# Patient Record
Sex: Female | Born: 1995 | Race: Black or African American | Hispanic: No | Marital: Single | Smoking: Never smoker
Health system: Southern US, Community
[De-identification: ages and names within clinical notes are randomized; demographics above are authoritative.]

## PROBLEM LIST (undated history)

## (undated) ENCOUNTER — Inpatient Hospital Stay (HOSPITAL_COMMUNITY): Payer: Self-pay

## (undated) ENCOUNTER — Ambulatory Visit (HOSPITAL_COMMUNITY): Admission: EM | Payer: Self-pay | Source: Home / Self Care

## (undated) DIAGNOSIS — D219 Benign neoplasm of connective and other soft tissue, unspecified: Secondary | ICD-10-CM

## (undated) DIAGNOSIS — F329 Major depressive disorder, single episode, unspecified: Secondary | ICD-10-CM

## (undated) DIAGNOSIS — D75A Glucose-6-phosphate dehydrogenase (G6PD) deficiency without anemia: Secondary | ICD-10-CM

## (undated) DIAGNOSIS — Z3400 Encounter for supervision of normal first pregnancy, unspecified trimester: Secondary | ICD-10-CM

## (undated) DIAGNOSIS — F32A Depression, unspecified: Secondary | ICD-10-CM

## (undated) HISTORY — DX: Depression, unspecified: F32.A

## (undated) HISTORY — PX: NO PAST SURGERIES: SHX2092

## (undated) HISTORY — DX: Major depressive disorder, single episode, unspecified: F32.9

## (undated) HISTORY — DX: Encounter for supervision of normal first pregnancy, unspecified trimester: Z34.00

---
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
## 2017-08-07 NOTE — L&D Delivery Note (Signed)
Patient received cytotec for induction of labor after SROM at 15 weeks. Patient delivered fetus and called out, RN present and clamped cord and took fetus away. Placenta remained in vagina. After additional cytotec, placenta remained partially in vagina and was able to be manually removed with gentle traction. Appeared intact and appropriately sized for gestational age after removal, no additional bleeding, no tissue noted in lower uterine segment. Pitocin continued. Cytogenetics sent.  Please see progress notes dated from same day for additional information.    Feliz Beam, M.D. [facility]

## 2017-09-16 ENCOUNTER — Other Ambulatory Visit: Payer: Self-pay

## 2017-09-16 ENCOUNTER — Encounter (HOSPITAL_COMMUNITY): Payer: Self-pay | Admitting: *Deleted

## 2017-09-16 ENCOUNTER — Emergency Department (HOSPITAL_COMMUNITY)
Admission: EM | Admit: 2017-09-16 | Discharge: 2017-09-16 | Disposition: A | Discharge status: Home / Self Care | Payer: Medicaid Other | Attending: Emergency Medicine | Admitting: Emergency Medicine

## 2017-09-16 DIAGNOSIS — H9202 Otalgia, left ear: Secondary | ICD-10-CM | POA: Insufficient documentation

## 2017-09-16 DIAGNOSIS — J111 Influenza due to unidentified influenza virus with other respiratory manifestations: Secondary | ICD-10-CM | POA: Diagnosis not present

## 2017-09-16 DIAGNOSIS — R1110 Vomiting, unspecified: Secondary | ICD-10-CM | POA: Diagnosis present

## 2017-09-16 DIAGNOSIS — M7918 Myalgia, other site: Secondary | ICD-10-CM | POA: Diagnosis not present

## 2017-09-16 DIAGNOSIS — R6889 Other general symptoms and signs: Secondary | ICD-10-CM

## 2017-09-16 DIAGNOSIS — B349 Viral infection, unspecified: Secondary | ICD-10-CM

## 2017-09-16 HISTORY — DX: Glucose-6-phosphate dehydrogenase (G6PD) deficiency without anemia: D75.A

## 2017-09-16 LAB — POC URINE PREG, ED: Preg Test, Ur: NEGATIVE

## 2017-09-16 MED ORDER — GUAIFENESIN ER 1200 MG PO TB12: 1.0000 | ORAL_TABLET | Freq: Two times a day (BID) | ORAL | 0 refills | Status: DC

## 2017-09-16 MED ORDER — ACETAMINOPHEN 500 MG PO TABS
1000.0000 mg | ORAL_TABLET | Freq: Once | ORAL | Status: AC
  Administered 2017-09-16: 1000 mg via ORAL
  Filled 2017-09-16: qty 2

## 2017-09-16 MED ORDER — IBUPROFEN 800 MG PO TABS: 800.0000 mg | ORAL_TABLET | Freq: Three times a day (TID) | ORAL | 0 refills | Status: DC | PRN

## 2017-09-16 MED ORDER — SODIUM CHLORIDE 0.9 % IV BOLUS (SEPSIS)
1000.0000 mL | Freq: Once | INTRAVENOUS | Status: AC
  Administered 2017-09-16: 1000 mL via INTRAVENOUS

## 2017-09-16 MED ORDER — IBUPROFEN 800 MG PO TABS
800.0000 mg | ORAL_TABLET | Freq: Once | ORAL | Status: AC
  Administered 2017-09-16: 800 mg via ORAL
  Filled 2017-09-16: qty 1

## 2017-09-16 NOTE — ED Provider Notes (Signed)
[facility] DEPT Provider Note   CSN: [id number] Arrival date & time: 09/16/17  0911     History   Chief Complaint Chief Complaint  Patient presents with  . Influenza    HPI Sandra Macdonald is a 22 y.o. female.  HPI Patient presents to the emergency department with body aches sore throat earaches and vomiting x1 the symptoms started yesterday..  The patient states that she did not take any medications prior to arrival.  Nothing seemed to make the condition better or worse.  She states that she felt like she may have a fever but did not check her temperature.  The patient denies chest pain, shortness of breath, headache,blurred vision, neck pain, fever, cough, weakness, numbness, dizziness, anorexia, edema, abdominal pain, nausea,  diarrhea, rash, back pain, dysuria, hematemesis, bloody stool, near syncope, or syncope. Past Medical History:  Diagnosis Date  . 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   History reviewed. No pertinent surgical history.  OB History    No data available       Home Medications    Prior to Admission medications   Medication Sig Start Date End Date Taking? Authorizing Provider  Pseudoeph-Doxylamine-DM-APAP (NYQUIL PO) Take 1-2 capsules by mouth at bedtime as needed (cold symptoms).   Yes [provider]  Guaifenesin 1200 MG TB12 Take 1 tablet (1,200 mg total) by mouth 2 (two) times daily. 09/16/17   Mareta Chesnut, Harrell Gave, PA-C  ibuprofen (ADVIL,MOTRIN) 800 MG tablet Take 1 tablet (800 mg total) by mouth every 8 (eight) hours as needed. 09/16/17   Dalia Heading, PA-C    Family History No family history on file.  Social History Social History   Tobacco Use  . Smoking status: Never Smoker  . Smokeless tobacco: Never Used  Substance Use Topics  . Alcohol use: No    Frequency: Never  . Drug use: No     Allergies   Blue dyes (parenteral); Codeine; and  Other   Review of Systems Review of Systems  All other systems negative except as documented in the HPI. All pertinent positives and negatives as reviewed in the HPI. Physical Exam Updated Vital Signs BP (!) 104/49   Pulse 96   Temp 98.7 F (37.1 C) (Oral)   Resp 16   Ht 5\' 5"  (1.651 m)   SpO2 100%   Physical Exam  Constitutional: She is oriented to person, place, and time. She appears well-developed and well-nourished. No distress.  HENT:  Head: Normocephalic and atraumatic.  Mouth/Throat: Oropharynx is clear and moist.  Eyes: Pupils are equal, round, and reactive to light.  Neck: Normal range of motion. Neck supple.  Cardiovascular: Normal rate, regular rhythm and normal heart sounds. Exam reveals no gallop and no friction rub.  No murmur heard. Pulmonary/Chest: Effort normal and breath sounds normal. No respiratory distress. She has no wheezes.  Abdominal: Soft. Bowel sounds are normal. She exhibits no distension. There is no tenderness.  Neurological: She is alert and oriented to person, place, and time. She exhibits normal muscle tone. Coordination normal.  Skin: Skin is warm and dry. Capillary refill takes less than 2 seconds. No rash noted. No erythema.  Psychiatric: She has a normal mood and affect. Her behavior is normal.  Nursing note and vitals reviewed.    ED Treatments / Results  Labs (all labs ordered are listed, but only abnormal results are displayed) Labs Reviewed  POC URINE PREG, ED  EKG  EKG Interpretation None       Radiology No results found.  Procedures Procedures (including critical care time)  Medications Ordered in ED Medications  sodium chloride 0.9 % bolus 1,000 mL (0 mLs Intravenous Stopped 09/16/17 1136)  sodium chloride 0.9 % bolus 1,000 mL (0 mLs Intravenous Stopped 09/16/17 1205)  ibuprofen (ADVIL,MOTRIN) tablet 800 mg (800 mg Oral Given 09/16/17 1136)  acetaminophen (TYLENOL) tablet 1,000 mg (1,000 mg Oral Given 09/16/17 1136)      Initial Impression / Assessment and Plan / ED Course  I have reviewed the triage vital signs and the nursing notes.  Pertinent labs & imaging results that were available during my care of the patient were reviewed by me and considered in my medical decision making (see chart for details).    The patient's symptoms have improved following IV fluids along with antipyretics.  The patient is advised to take Tylenol Motrin for fever given symptom control for what appears to be an influenza-like illness.  Told to increase her fluid intake and rest as much as possible.  The patient agrees the plan and all questions were answered.  She did get IV fluids which seemed to improve her as well.  Final Clinical Impressions(s) / ED Diagnoses   Final diagnoses:  Flu-like symptoms  Viral syndrome  Viral illness    ED Discharge Orders        Ordered    ibuprofen (ADVIL,MOTRIN) 800 MG tablet  Every 8 hours PRN     09/16/17 1214    Guaifenesin 1200 MG TB12  2 times daily     09/16/17 [address] PA-C 09/16/17 1611    Jola Schmidt, MD 09/16/17 [phone]

## 2017-09-16 NOTE — ED Triage Notes (Signed)
Pt vaguely describes flu symptoms, body aches sore left ear pain, vomited x 1 this morning, also requesting pregnancy test

## 2017-09-16 NOTE — Discharge Instructions (Signed)
Return here as needed.  Increase your fluid intake. 

## 2017-10-08 ENCOUNTER — Encounter (HOSPITAL_COMMUNITY): Payer: Self-pay | Admitting: Emergency Medicine

## 2017-10-08 ENCOUNTER — Other Ambulatory Visit: Payer: Self-pay

## 2017-10-08 ENCOUNTER — Emergency Department (HOSPITAL_COMMUNITY)
Admission: EM | Admit: 2017-10-08 | Discharge: 2017-10-08 | Disposition: A | Discharge status: Home / Self Care | Payer: Medicaid Other | Attending: Emergency Medicine | Admitting: Emergency Medicine

## 2017-10-08 DIAGNOSIS — B379 Candidiasis, unspecified: Secondary | ICD-10-CM | POA: Diagnosis not present

## 2017-10-08 DIAGNOSIS — R300 Dysuria: Secondary | ICD-10-CM | POA: Diagnosis present

## 2017-10-08 DIAGNOSIS — N390 Urinary tract infection, site not specified: Secondary | ICD-10-CM

## 2017-10-08 LAB — URINALYSIS, ROUTINE W REFLEX MICROSCOPIC
BILIRUBIN URINE: NEGATIVE
GLUCOSE, UA: NEGATIVE mg/dL
HGB URINE DIPSTICK: NEGATIVE
Nitrite: NEGATIVE
PROTEIN: NEGATIVE mg/dL
Specific Gravity, Urine: >1.03 — ABNORMAL HIGH (ref 1.005–1.030)
pH: 5.5 (ref 5.0–8.0)

## 2017-10-08 LAB — URINALYSIS, MICROSCOPIC (REFLEX)

## 2017-10-08 LAB — WET PREP, GENITAL
Clue Cells Wet Prep HPF POC: NONE SEEN
SPERM: NONE SEEN
Trich, Wet Prep: NONE SEEN

## 2017-10-08 LAB — PREGNANCY, URINE: PREG TEST UR: NEGATIVE

## 2017-10-08 LAB — GC/CHLAMYDIA PROBE AMP (~~LOC~~) NOT AT ARMC
Chlamydia: NEGATIVE
NEISSERIA GONORRHEA: NEGATIVE

## 2017-10-08 MED ORDER — AZITHROMYCIN 250 MG PO TABS
1000.0000 mg | ORAL_TABLET | Freq: Once | ORAL | Status: AC
  Administered 2017-10-08: 1000 mg via ORAL
  Filled 2017-10-08: qty 4

## 2017-10-08 MED ORDER — CEFTRIAXONE SODIUM 250 MG IJ SOLR
250.0000 mg | Freq: Once | INTRAMUSCULAR | Status: AC
  Administered 2017-10-08: 250 mg via INTRAMUSCULAR
  Filled 2017-10-08: qty 250

## 2017-10-08 MED ORDER — FLUCONAZOLE 200 MG PO TABS: 200.0000 mg | ORAL_TABLET | Freq: Every day | ORAL | 0 refills | Status: AC

## 2017-10-08 MED ORDER — CEPHALEXIN 500 MG PO CAPS: 500.0000 mg | ORAL_CAPSULE | Freq: Three times a day (TID) | ORAL | 0 refills | Status: AC

## 2017-10-08 MED ORDER — FLUCONAZOLE 200 MG PO TABS
200.0000 mg | ORAL_TABLET | Freq: Once | ORAL | Status: AC
  Administered 2017-10-08: 200 mg via ORAL
  Filled 2017-10-08: qty 1

## 2017-10-08 MED ORDER — LIDOCAINE HCL (PF) 1 % IJ SOLN
2.0000 mL | Freq: Once | INTRAMUSCULAR | Status: AC
  Administered 2017-10-08: 2 mL
  Filled 2017-10-08: qty 30

## 2017-10-08 NOTE — ED Notes (Signed)
Pelvic exam complete.

## 2017-10-08 NOTE — ED Provider Notes (Signed)
[facility] DEPT Provider Note   CSN: [id number] Arrival date & time: 10/08/17  2130     History   Chief Complaint Chief Complaint  Patient presents with  . Dysuria    HPI Sandra Macdonald is a 22 y.o. female.  HPI   22yo female with history of G6PD who presents with concern dysuria and white vaginal discharge.  Began one month ago with symptoms coming and going but are at their worst today. No fevers, no vomiting. Denies significant abdominal pain, reports mild suprapubic. Urinary frequency/hesitency.  No diarrhea or constipation.  Past Medical History:  Diagnosis Date  . 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   History reviewed. No pertinent surgical history.  OB History    No data available       Home Medications    Prior to Admission medications   Medication Sig Start Date End Date Taking? Authorizing Provider  ibuprofen (ADVIL,MOTRIN) 800 MG tablet Take 1 tablet (800 mg total) by mouth every 8 (eight) hours as needed. Patient taking differently: Take 800 mg by mouth every 8 (eight) hours as needed (For pain.).  09/16/17  Yes Lawyer, Harrell Gave, PA-C  Pseudoeph-Doxylamine-DM-APAP (NYQUIL PO) Take 1-2 capsules by mouth at bedtime as needed (cold symptoms).   Yes [provider]  cephALEXin (KEFLEX) 500 MG capsule Take 1 capsule (500 mg total) by mouth 3 (three) times daily for 5 days. 10/08/17 10/13/17  Gareth Morgan, MD  fluconazole (DIFLUCAN) 200 MG tablet Take 1 tablet (200 mg total) by mouth daily for 1 day. 10/11/17 10/12/17  Gareth Morgan, MD    Family History History reviewed. No pertinent family history.  Social History Social History   Tobacco Use  . Smoking status: Never Smoker  . Smokeless tobacco: Never Used  Substance Use Topics  . Alcohol use: No    Frequency: Never  . Drug use: No     Allergies   Blue dyes (parenteral); Codeine; and Other   Review of  Systems Review of Systems  Constitutional: Negative for fever.  HENT: Negative for sore throat.   Eyes: Negative for visual disturbance.  Respiratory: Negative for cough and shortness of breath.   Cardiovascular: Negative for chest pain.  Gastrointestinal: Positive for abdominal pain. Negative for constipation, diarrhea, nausea and vomiting.  Genitourinary: Positive for dysuria, urgency and vaginal discharge. Negative for difficulty urinating.  Musculoskeletal: Negative for back pain.  Skin: Negative for rash.  Neurological: Negative for syncope and headaches.     Physical Exam Updated Vital Signs BP 104/75   Pulse (!) 111   Temp 98.3 F (36.8 C) (Oral)   Resp 18   Ht 5\' 5"  (1.651 m)   LMP 09/24/2017 (Approximate)   SpO2 97%   Physical Exam  Constitutional: She is oriented to person, place, and time. She appears well-developed and well-nourished. No distress.  HENT:  Head: Normocephalic and atraumatic.  Eyes: Conjunctivae and EOM are normal.  Neck: Normal range of motion.  Cardiovascular: Normal rate, regular rhythm and intact distal pulses.  Pulmonary/Chest: Effort normal. No respiratory distress.  Abdominal: Soft. She exhibits no distension. There is no tenderness. There is no guarding.  Genitourinary: Uterus is not tender. Cervix exhibits discharge.  Musculoskeletal: She exhibits no edema or tenderness.  Neurological: She is alert and oriented to person, place, and time.  Skin: Skin is warm and dry. No rash noted. She is not diaphoretic. No erythema.  Nursing note  and vitals reviewed.    ED Treatments / Results  Labs (all labs ordered are listed, but only abnormal results are displayed) Labs Reviewed  WET PREP, GENITAL - Abnormal; Notable for the following components:      Result Value   Yeast Wet Prep HPF POC PRESENT (*)    WBC, Wet Prep HPF POC MANY (*)    All other components within normal limits  URINALYSIS, ROUTINE W REFLEX MICROSCOPIC - Abnormal; Notable  for the following components:   APPearance CLOUDY (*)    Specific Gravity, Urine >1.030 (*)    Ketones, ur TRACE (*)    Leukocytes, UA MODERATE (*)    All other components within normal limits  URINALYSIS, MICROSCOPIC (REFLEX) - Abnormal; Notable for the following components:   Bacteria, UA FEW (*)    Squamous Epithelial / LPF 6-30 (*)    All other components within normal limits  URINE CULTURE  PREGNANCY, URINE  GC/CHLAMYDIA PROBE AMP ([place]) NOT AT [facility]    EKG  EKG Interpretation None       Radiology No results found.  Procedures Procedures (including critical care time)  Medications Ordered in ED Medications  fluconazole (DIFLUCAN) tablet 200 mg (not administered)  cefTRIAXone (ROCEPHIN) injection 250 mg (250 mg Intramuscular Given 10/08/17 0821)  azithromycin (ZITHROMAX) tablet 1,000 mg (1,000 mg Oral Given 10/08/17 0821)  lidocaine (PF) (XYLOCAINE) 1 % injection 2 mL (2 mLs Other Given 10/08/17 5852)     Initial Impression / Assessment and Plan / ED Course  I have reviewed the triage vital signs and the nursing notes.  Pertinent labs & imaging results that were available during my care of the patient were reviewed by me and considered in my medical decision making (see chart for details).      22yo female with history of G6PD who presents with concern dysuria and white vaginal discharge.  Pregnancy test negative. Urinalysis shows concern for UTI in the setting or urinary symptoms.  Wet prep shows yeast .  Offered treatment empirically for GC/Chl which patient accepts.  Given keflex for UTI, fluconazole in ED, and dose to take when she completes her abx. Counseled in safe sex practices. Patient discharged in stable condition with understanding of reasons to return.     Final Clinical Impressions(s) / ED Diagnoses   Final diagnoses:  Urinary tract infection without hematuria, site unspecified  Yeast infection    ED Discharge Orders        Ordered     fluconazole (DIFLUCAN) 200 MG tablet  Daily     10/08/17 0911    cephALEXin (KEFLEX) 500 MG capsule  3 times daily     10/08/17 7782       Gareth Morgan, MD 10/08/17 [phone]

## 2017-10-08 NOTE — ED Triage Notes (Signed)
Pt presents to ED with c/o pain and burning on urination, and white vaginal discharges--- onset a month ago when started having sex.  Pt states symptoms come and go but it just got its worst today.

## 2017-10-08 NOTE — ED Notes (Signed)
This RN did not send urine culture down with UA. Will make patient aware of need for another urine sample.

## 2017-10-09 LAB — URINE CULTURE

## 2017-10-10 ENCOUNTER — Telehealth: Payer: Self-pay | Admitting: Emergency Medicine

## 2017-10-10 NOTE — Telephone Encounter (Signed)
Post ED Visit - Positive Culture Follow-up  Culture report reviewed by antimicrobial stewardship pharmacist:  []  Elenor Quinones, Pharm.D. []  Heide Guile, Pharm.D., BCPS AQ-ID []  Parks Neptune, Pharm.D., BCPS []  Alycia Rossetti, Pharm.D., BCPS []  [place], Pharm.D., BCPS, AAHIVP []  Legrand Como, Pharm.D., BCPS, AAHIVP []  Salome Arnt, PharmD, BCPS []  Jalene Mullet, PharmD []  Vincenza Hews, PharmD, BCPS [facility] PharmD  Positive urine culture Treated with cephalexin and fluconazole, organism sensitive to the same and no further patient follow-up is required at this time.  Hazle Nordmann 10/10/2017, 12:15 PM

## 2017-10-10 NOTE — Telephone Encounter (Signed)
Post ED Visit - Positive Culture Follow-up  Culture report reviewed by antimicrobial stewardship pharmacist:  []  Elenor Quinones, Pharm.D. []  Heide Guile, Pharm.D., BCPS AQ-ID []  Parks Neptune, Pharm.D., BCPS []  Alycia Rossetti, Pharm.D., BCPS []  [place], Pharm.D., BCPS, AAHIVP []  Legrand Como, Pharm.D., BCPS, AAHIVP []  Salome Arnt, PharmD, BCPS []  Jalene Mullet, PharmD []  Vincenza Hews, PharmD, BCPS [facility] PharmD  Positive urine culture Treated with cephalexin and fluconazole, organism sensitive to the same and no further patient follow-up is required at this time.  Hazle Nordmann 10/10/2017, 11:58 AM

## 2017-12-26 ENCOUNTER — Other Ambulatory Visit: Payer: Self-pay

## 2017-12-26 ENCOUNTER — Ambulatory Visit (INDEPENDENT_AMBULATORY_CARE_PROVIDER_SITE_OTHER): Payer: Medicaid Other

## 2017-12-26 ENCOUNTER — Encounter: Payer: Self-pay | Admitting: Family Medicine

## 2017-12-26 DIAGNOSIS — N912 Amenorrhea, unspecified: Secondary | ICD-10-CM | POA: Diagnosis present

## 2017-12-26 DIAGNOSIS — Z3201 Encounter for pregnancy test, result positive: Secondary | ICD-10-CM

## 2017-12-26 LAB — POCT URINE PREGNANCY: Preg Test, Ur: POSITIVE — AB

## 2017-12-26 NOTE — Patient Instructions (Signed)
Start taking prenatal vitamins with DHA.  Follow up with PCP for other issues.

## 2017-12-26 NOTE — Progress Notes (Signed)
Patient here today for pregnancy test.  LMP 11/30/2017.  Patient has had some spotting the last couple days, light pink does not soak a panty liner. No other issues. ECC 09/09/2018

## 2017-12-27 ENCOUNTER — Encounter (HOSPITAL_COMMUNITY): Payer: Self-pay | Admitting: *Deleted

## 2017-12-27 ENCOUNTER — Inpatient Hospital Stay (HOSPITAL_COMMUNITY)
Admission: AD | Admit: 2017-12-27 | Discharge: 2017-12-27 | Disposition: A | Discharge status: Home / Self Care | Payer: Medicaid Other | Source: Ambulatory Visit | Attending: Obstetrics and Gynecology | Admitting: Obstetrics and Gynecology

## 2017-12-27 DIAGNOSIS — Z885 Allergy status to narcotic agent status: Secondary | ICD-10-CM | POA: Insufficient documentation

## 2017-12-27 DIAGNOSIS — Z3A01 Less than 8 weeks gestation of pregnancy: Secondary | ICD-10-CM | POA: Diagnosis not present

## 2017-12-27 DIAGNOSIS — O26891 Other specified pregnancy related conditions, first trimester: Secondary | ICD-10-CM

## 2017-12-27 DIAGNOSIS — N898 Other specified noninflammatory disorders of vagina: Secondary | ICD-10-CM | POA: Diagnosis not present

## 2017-12-27 DIAGNOSIS — R300 Dysuria: Secondary | ICD-10-CM | POA: Diagnosis not present

## 2017-12-27 LAB — URINALYSIS, ROUTINE W REFLEX MICROSCOPIC
BILIRUBIN URINE: NEGATIVE
Glucose, UA: NEGATIVE mg/dL
HGB URINE DIPSTICK: NEGATIVE
KETONES UR: NEGATIVE mg/dL
NITRITE: NEGATIVE
Protein, ur: NEGATIVE mg/dL
SPECIFIC GRAVITY, URINE: 1.02 (ref 1.005–1.030)
pH: 5 (ref 5.0–8.0)

## 2017-12-27 LAB — WET PREP, GENITAL
Clue Cells Wet Prep HPF POC: NONE SEEN
SPERM: NONE SEEN
Trich, Wet Prep: NONE SEEN
Yeast Wet Prep HPF POC: NONE SEEN

## 2017-12-27 NOTE — Discharge Instructions (Signed)

## 2017-12-27 NOTE — MAU Provider Note (Addendum)
History     CSN: [id number]  Arrival date and time: 12/27/17 1456  Chief Complaint  Patient presents with  . Vaginal Discharge  . Vaginal Itching   G1 @[redacted]w[redacted]d  here with vaginal discharge and dysuria. Sx started 2 weeks ago. Vaginal discharge is yellow, thick, and malodorous. Having some irritation but not itching. No new partner. No hx STDs. Denies hematuria, urgency, frequency. No VB or pain.  OB History    Gravida  1   Para      Term      Preterm      AB      Living        SAB      TAB      Ectopic      Multiple      Live Births              Past Medical History:  Diagnosis Date  . 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  No family history on file.  Social History   Tobacco Use  . Smoking status: Never Smoker  . Smokeless tobacco: Never Used  Substance Use Topics  . Alcohol use: No    Frequency: Never  . Drug use: No    Allergies:  Allergies  Allergen Reactions  . Blue Dyes (Parenteral) Other (See Comments)    G6PD-- Decreased hemoglobin  . Codeine Other (See Comments)    Decreased hemoglobin  . Other Other (See Comments)    G6PD--Decreased hemoglobin to potato chips (not potatoes in other forms)    Medications Prior to Admission  Medication Sig Dispense Refill Last Dose  . ibuprofen (ADVIL,MOTRIN) 800 MG tablet Take 1 tablet (800 mg total) by mouth every 8 (eight) hours as needed. (Patient taking differently: Take 800 mg by mouth every 8 (eight) hours as needed (For pain.). ) 21 tablet 0 3 weeks ago  . Pseudoeph-Doxylamine-DM-APAP (NYQUIL PO) Take 1-2 capsules by mouth at bedtime as needed (cold symptoms).   3 weeks ago    Review of Systems  Gastrointestinal: Negative for abdominal pain.  Genitourinary: Positive for dysuria and vaginal discharge. Negative for frequency, hematuria, urgency and vaginal bleeding.   Physical Exam   Blood pressure 125/60, pulse 99, temperature 98.7 F (37.1 C), resp. rate 18,  height 5' 5.5" (1.664 m), weight 168 lb (76.2 kg), last menstrual period 11/30/2017.  Physical Exam  Constitutional: She is oriented to person, place, and time. She appears well-developed and well-nourished. No distress.  HENT:  Head: Normocephalic.  Respiratory: Effort normal.  Musculoskeletal: Normal range of motion.  Neurological: She is alert and oriented to person, place, and time.  Skin: Skin is warm and dry.  Psychiatric: She has a normal mood and affect.   Results for orders placed or performed during the hospital encounter of 12/27/17 (from the past 24 hour(s))  Urinalysis, Routine w reflex microscopic     Status: Abnormal   Collection Time: 12/27/17  3:30 PM  Result Value Ref Range   Color, Urine YELLOW YELLOW   APPearance HAZY (A) CLEAR   Specific Gravity, Urine 1.020 1.005 - 1.030   pH 5.0 5.0 - 8.0   Glucose, UA NEGATIVE NEGATIVE mg/dL   Hgb urine dipstick NEGATIVE NEGATIVE   Bilirubin Urine NEGATIVE NEGATIVE   Ketones, ur NEGATIVE NEGATIVE mg/dL   Protein, ur NEGATIVE NEGATIVE mg/dL   Nitrite NEGATIVE NEGATIVE   Leukocytes, UA LARGE (A) NEGATIVE   RBC / HPF 0-5 0 -  5 RBC/hpf   WBC, UA 6-10 0 - 5 WBC/hpf   Bacteria, UA RARE (A) NONE SEEN   Squamous Epithelial / LPF 11-20 0 - 5   Mucus PRESENT   Wet prep, genital     Status: Abnormal   Collection Time: 12/27/17  3:30 PM  Result Value Ref Range   Yeast Wet Prep HPF POC NONE SEEN NONE SEEN   Trich, Wet Prep NONE SEEN NONE SEEN   Clue Cells Wet Prep HPF POC NONE SEEN NONE SEEN   WBC, Wet Prep HPF POC FEW (A) NONE SEEN   Sperm NONE SEEN    MAU Course  Procedures  MDM Labs ordered and reviewed. No evidence of yeast, BV, or UTI. Will send UC. GC pending. Stable for discharge.  Assessment and Plan   1. Dysuria   2. Vaginal discharge during pregnancy in first trimester   3. [redacted] weeks gestation of pregnancy    Discharge home Follow up with OB provider for care  Allergies as of 12/27/2017      Reactions   Blue  Dyes (parenteral) Other (See Comments)   G6PD-- Decreased hemoglobin   Codeine Other (See Comments)   Decreased hemoglobin   Other Other (See Comments)   G6PD--Decreased hemoglobin to potato chips (not potatoes in other forms)      Medication List    STOP taking these medications   ibuprofen 800 MG tablet Commonly known as:  [place]      Julianne Handler, CNM 12/27/2017, 4:43 PM

## 2017-12-27 NOTE — MAU Note (Signed)
Pt reports a yellow vaginal discharge and vaginal irritation for several weeks

## 2017-12-28 LAB — GC/CHLAMYDIA PROBE AMP (~~LOC~~) NOT AT ARMC
Chlamydia: NEGATIVE
NEISSERIA GONORRHEA: NEGATIVE

## 2017-12-29 LAB — CULTURE, OB URINE

## 2017-12-30 ENCOUNTER — Encounter (HOSPITAL_COMMUNITY): Payer: Self-pay | Admitting: *Deleted

## 2017-12-30 ENCOUNTER — Other Ambulatory Visit: Payer: Self-pay

## 2017-12-30 ENCOUNTER — Inpatient Hospital Stay (HOSPITAL_COMMUNITY): Payer: Medicaid Other

## 2017-12-30 ENCOUNTER — Inpatient Hospital Stay (HOSPITAL_COMMUNITY)
Admission: AD | Admit: 2017-12-30 | Discharge: 2017-12-30 | Disposition: A | Discharge status: Home / Self Care | Payer: Medicaid Other | Source: Ambulatory Visit | Attending: Obstetrics & Gynecology | Admitting: Obstetrics & Gynecology

## 2017-12-30 DIAGNOSIS — R109 Unspecified abdominal pain: Secondary | ICD-10-CM | POA: Insufficient documentation

## 2017-12-30 DIAGNOSIS — O26891 Other specified pregnancy related conditions, first trimester: Secondary | ICD-10-CM | POA: Diagnosis not present

## 2017-12-30 DIAGNOSIS — O3411 Maternal care for benign tumor of corpus uteri, first trimester: Secondary | ICD-10-CM | POA: Insufficient documentation

## 2017-12-30 DIAGNOSIS — O3680X0 Pregnancy with inconclusive fetal viability, not applicable or unspecified: Secondary | ICD-10-CM

## 2017-12-30 DIAGNOSIS — Z3A01 Less than 8 weeks gestation of pregnancy: Secondary | ICD-10-CM | POA: Diagnosis not present

## 2017-12-30 DIAGNOSIS — O26899 Other specified pregnancy related conditions, unspecified trimester: Secondary | ICD-10-CM

## 2017-12-30 DIAGNOSIS — D219 Benign neoplasm of connective and other soft tissue, unspecified: Secondary | ICD-10-CM

## 2017-12-30 DIAGNOSIS — D259 Leiomyoma of uterus, unspecified: Secondary | ICD-10-CM | POA: Diagnosis not present

## 2017-12-30 LAB — URINALYSIS, ROUTINE W REFLEX MICROSCOPIC
Bilirubin Urine: NEGATIVE
Glucose, UA: NEGATIVE mg/dL
Hgb urine dipstick: NEGATIVE
Ketones, ur: NEGATIVE mg/dL
Nitrite: NEGATIVE
Protein, ur: NEGATIVE mg/dL
Specific Gravity, Urine: 1.004 — ABNORMAL LOW (ref 1.005–1.030)
pH: 6 (ref 5.0–8.0)

## 2017-12-30 LAB — COMPREHENSIVE METABOLIC PANEL WITH GFR
ALT: 12 U/L — ABNORMAL LOW (ref 14–54)
AST: 16 U/L (ref 15–41)
Albumin: 3.8 g/dL (ref 3.5–5.0)
Alkaline Phosphatase: 43 U/L (ref 38–126)
Anion gap: 9 (ref 5–15)
BUN: 10 mg/dL (ref 6–20)
CO2: 23 mmol/L (ref 22–32)
Calcium: 9.2 mg/dL (ref 8.9–10.3)
Chloride: 105 mmol/L (ref 101–111)
Creatinine, Ser: 0.66 mg/dL (ref 0.44–1.00)
GFR calc Af Amer: >60 mL/min
GFR calc non Af Amer: >60 mL/min
Glucose, Bld: 95 mg/dL (ref 65–99)
Potassium: 3.4 mmol/L — ABNORMAL LOW (ref 3.5–5.1)
Sodium: 137 mmol/L (ref 135–145)
Total Bilirubin: 0.3 mg/dL (ref 0.3–1.2)
Total Protein: 7.7 g/dL (ref 6.5–8.1)

## 2017-12-30 LAB — CBC
HCT: 29.6 % — ABNORMAL LOW (ref 36.0–46.0)
Hemoglobin: 8.7 g/dL — ABNORMAL LOW (ref 12.0–15.0)
MCH: 21.9 pg — ABNORMAL LOW (ref 26.0–34.0)
MCHC: 29.4 g/dL — ABNORMAL LOW (ref 30.0–36.0)
MCV: 74.6 fL — ABNORMAL LOW (ref 78.0–100.0)
Platelets: 408 K/uL — ABNORMAL HIGH (ref 150–400)
RBC: 3.97 MIL/uL (ref 3.87–5.11)
RDW: 18.9 % — ABNORMAL HIGH (ref 11.5–15.5)
WBC: 7.4 K/uL (ref 4.0–10.5)

## 2017-12-30 LAB — HCG, QUANTITATIVE, PREGNANCY: hCG, Beta Chain, Quant, S: 5880 m[IU]/mL — ABNORMAL HIGH

## 2017-12-30 NOTE — Discharge Instructions (Signed)
Ectopic Pregnancy An ectopic pregnancy happens when a fertilized egg grows outside the uterus. A pregnancy cannot live outside of the uterus. This problem often happens in the fallopian tube. It is often caused by damage to the fallopian tube. If this problem is found early, you may be treated with medicine. If your tube tears or bursts open (ruptures), you will bleed inside. This is an emergency. You will need surgery. Get help right away. What are the signs or symptoms? You may have normal pregnancy symptoms at first. These include:  Missing your period.  Feeling sick to your stomach (nauseous).  Being tired.  Having tender breasts.  Then, you may start to have symptoms that are not normal. These include:  Pain with sex (intercourse).  Bleeding from the vagina. This includes light bleeding (spotting).  Belly (abdomen) or lower belly cramping or pain. This may be felt on one side.  A fast heartbeat (pulse).  Passing out (fainting) after going poop (bowel movement).  If your tube tears, you may have symptoms such as:  Really bad pain in the belly or lower belly. This happens suddenly.  Dizziness.  Passing out.  Shoulder pain.  Get help right away if: You have any of these symptoms. This is an emergency. This information is not intended to replace advice given to you by your health care provider. Make sure you discuss any questions you have with your health care provider. Document Released: 10/20/2008 Document Revised: 12/30/2015 Document Reviewed: 03/05/2013 Elsevier Interactive Patient Education  2017 [place] [facility] at 8:30 am on Wednesday, May 29 for lab work. Plan to be at the clinic for at least 2 hours. \   First Trimester of Pregnancy The first trimester of pregnancy is from week 1 until the end of week 13 (months 1 through 3). During this time, your baby will begin to develop inside you. At 6-8 weeks, the eyes and face  are formed, and the heartbeat can be seen on ultrasound. At the end of 12 weeks, all the baby's organs are formed. Prenatal care is all the medical care you receive before the birth of your baby. Make sure you get good prenatal care and follow all of your doctor's instructions. Follow these instructions at home: Medicines  Take over-the-counter and prescription medicines only as told by your doctor. Some medicines are safe and some medicines are not safe during pregnancy.  Take a prenatal vitamin that contains at least 600 micrograms (mcg) of folic acid.  If you have trouble pooping (constipation), take medicine that will make your stool soft (stool softener) if your doctor approves. Eating and drinking  Eat regular, healthy meals.  Your doctor will tell you the amount of weight gain that is right for you.  Avoid raw meat and uncooked cheese.  If you feel sick to your stomach (nauseous) or throw up (vomit): ? Eat 4 or 5 small meals a day instead of 3 large meals. ? Try eating a few soda crackers. ? Drink liquids between meals instead of during meals.  To prevent constipation: ? Eat foods that are high in fiber, like fresh fruits and vegetables, whole grains, and beans. ? Drink enough fluids to keep your pee (urine) clear or pale yellow. Activity  Exercise only as told by your doctor. Stop exercising if you have cramps or pain in your lower belly (abdomen) or low back.  Do not exercise if it is too hot, too humid, or if you  are in a place of great height (high altitude).  Try to avoid standing for long periods of time. Move your legs often if you must stand in one place for a long time.  Avoid heavy lifting.  Wear low-heeled shoes. Sit and stand up straight.  You can have sex unless your doctor tells you not to. Relieving pain and discomfort  Wear a good support bra if your breasts are sore.  Take warm water baths (sitz baths) to soothe pain or discomfort caused by  hemorrhoids. Use hemorrhoid cream if your doctor says it is okay.  Rest with your legs raised if you have leg cramps or low back pain.  If you have puffy, bulging veins (varicose veins) in your legs: ? Wear support hose or compression stockings as told by your doctor. ? Raise (elevate) your feet for 15 minutes, 3-4 times a day. ? Limit salt in your food. Prenatal care  Schedule your prenatal visits by the twelfth week of pregnancy.  Write down your questions. Take them to your prenatal visits.  Keep all your prenatal visits as told by your doctor. This is important. Safety  Wear your seat belt at all times when driving.  Make a list of emergency phone numbers. The list should include numbers for family, friends, the hospital, and police and fire departments. General instructions  Ask your doctor for a referral to a local prenatal class. Begin classes no later than at the start of month 6 of your pregnancy.  Ask for help if you need counseling or if you need help with nutrition. Your doctor can give you advice or tell you where to go for help.  Do not use hot tubs, steam rooms, or saunas.  Do not douche or use tampons or scented sanitary pads.  Do not cross your legs for long periods of time.  Avoid all herbs and alcohol. Avoid drugs that are not approved by your doctor.  Do not use any tobacco products, including cigarettes, chewing tobacco, and electronic cigarettes. If you need help quitting, ask your doctor. You may get counseling or other support to help you quit.  Avoid cat litter boxes and soil used by cats. These carry germs that can cause birth defects in the baby and can cause a loss of your baby (miscarriage) or stillbirth.  Visit your dentist. At home, brush your teeth with a soft toothbrush. Be gentle when you floss. Contact a doctor if:  You are dizzy.  You have mild cramps or pressure in your lower belly.  You have a nagging pain in your belly area.  You  continue to feel sick to your stomach, you throw up, or you have watery poop (diarrhea).  You have a bad smelling fluid coming from your vagina.  You have pain when you pee (urinate).  You have increased puffiness (swelling) in your face, hands, legs, or ankles. Get help right away if:  You have a fever.  You are leaking fluid from your vagina.  You have spotting or bleeding from your vagina.  You have very bad belly cramping or pain.  You gain or lose weight rapidly.  You throw up blood. It may look like coffee grounds.  You are around people who have [place] measles, fifth disease, or chickenpox.  You have a very bad headache.  You have shortness of breath.  You have any kind of trauma, such as from a fall or a car accident. Summary  The first trimester of pregnancy is  from week 1 until the end of week 13 (months 1 through 3).  To take care of yourself and your unborn baby, you will need to eat healthy meals, take medicines only if your doctor tells you to do so, and do activities that are safe for you and your baby.  Keep all follow-up visits as told by your doctor. This is important as your doctor will have to ensure that your baby is healthy and growing well. This information is not intended to replace advice given to you by your health care provider. Make sure you discuss any questions you have with your health care provider. Document Released: 01/10/2008 Document Revised: 08/01/2016 Document Reviewed: 08/01/2016 Elsevier Interactive Patient Education  2017 Elsevier Inc[employer] Uterine Fibroids Uterine fibroids are tissue masses (tumors). They are also called leiomyomas. They can develop inside of a womans womb (uterus). They can grow very large. Fibroids are not cancerous (benign). Most fibroids do not require medical treatment. Follow these instructions at home:  Keep all follow-up visits as told by your doctor. This is important.  Take medicines only as told by your  doctor. ? If you were prescribed a hormone treatment, take the hormone medicines exactly as told. ? Do not take aspirin. It can cause bleeding.  Ask your doctor about taking iron pills and increasing the amount of dark green, leafy vegetables in your diet. These actions can help to boost your blood iron levels.  Pay close attention to your period. Tell your doctor about any changes, such as: ? Increased blood flow. This may require you to use more pads or tampons than usual per month. ? A change in the number of days that your period lasts per month. ? A change in symptoms that come with your period, such as back pain or cramping in your belly area (abdomen). Contact a doctor if:  You have pain in your back or the area between your hip bones (pelvic area) that is not controlled by medicines.  You have pain in your abdomen that is not controlled with medicines.  You have an increase in bleeding between and during periods.  You soak tampons or pads in a half hour or less.  You feel lightheaded.  You feel extra tired.  You feel weak. Get help right away if:  You pass out (faint).  You have a sudden increase in pelvic pain. This information is not intended to replace advice given to you by your health care provider. Make sure you discuss any questions you have with your health care provider. Document Released: 08/26/2010 Document Revised: 03/24/2016 Document Reviewed: 01/20/2014 Elsevier Interactive Patient Education  [employer].

## 2017-12-30 NOTE — MAU Note (Signed)
ORTHOSTATIC VITAL SIGNS:  LYING: 118/55  PULSE: 94  SITTING: 127/61 PULSE: 74 STANDING: 111/73 PULSE: 93 STANDING 3: 109/70 PULSE: 103

## 2017-12-30 NOTE — MAU Note (Addendum)
Pt presents to MAU c/o lower abdominal cramping pt states this is a new issue that started this morning. Pt denies bleeding or LOF. Pt reports a white discharge that started this morning and now states it is "clear." pt states this happened after she had intercourse tonight. Pt reports feeling dizzy and that started around 1900. Pt denies passing out. Pt reports nausea but no vomiting or diarrhea.

## 2017-12-30 NOTE — MAU Provider Note (Signed)
Patient Sandra Macdonald is a 22 y.o. G1P0 At [redacted]w[redacted]d Here with complaints of abdominal pain and feeling dizzy. She was seen in MAU on 12-27-2017 with complaints of vaginal discharge and dysuria. Wet prep, GC and UC were negative.   Patient has returned tonight with complaints of dizziness x 1 episode and abdominal pain today. She denies bleeding, increased vaginal discharge, or other ob-gyn complaint.  History     CSN: [id number] Arrival date and time: 12/30/17 2116   None     Chief Complaint  Patient presents with  . Abdominal Pain  . Dizziness   Dizziness  This is a new problem. The current episode started today. The problem occurs rarely. The problem has been resolved. Associated symptoms include abdominal pain. Nothing aggravates the symptoms. She has tried nothing for the symptoms.  Back Pain  This is a new problem. The current episode started today. The problem occurs 2 to 4 times per day. The problem has been resolved since onset. The pain is present in the lumbar spine. The pain is at a severity of 7/10. The symptoms are aggravated by position. Associated symptoms include abdominal pain.   Patient had one episode today where she felt dizzy. This was at 7 pm. It lasted for a few moments. She has eaten pancakes, taco bell, mac and cheese and ribs today.   Patient could not initially tell me when she had her back pain. Answered " I don't know" when asked about back pain. When pressed, patient says that her back hurts after sleeping or taking a nap. She also endorses some pain in her lower abdomen; suprapubic region. It is cramping. It comes and goes and nothing makes it better or worse. She has not tried anything for it.    OB History    Gravida  1   Para      Term      Preterm      AB      Living        SAB      TAB      Ectopic      Multiple      Live Births              Past Medical History:  Diagnosis Date  . G6PD deficiency ([place]   . Medical history  non-contributory     Past Surgical History:  Procedure Laterality Date  . NO PAST SURGERIES      Family History  Problem Relation Age of Onset  . ADD / ADHD Neg Hx   . Anxiety disorder Neg Hx   . Alcohol abuse Neg Hx   . Arthritis Neg Hx   . Asthma Neg Hx   . Cancer Neg Hx   . Birth defects Neg Hx   . Depression Neg Hx   . COPD Neg Hx   . Diabetes Neg Hx   . Drug abuse Neg Hx   . Early death Neg Hx   . Hearing loss Neg Hx   . Heart disease Neg Hx   . Hyperlipidemia Neg Hx   . Hypertension Neg Hx   . Intellectual disability Neg Hx   . Kidney disease Neg Hx   . Learning disabilities Neg Hx   . Miscarriages / Stillbirths Neg Hx   . Stroke Neg Hx   . Obesity Neg Hx   . Vision loss Neg Hx   . Varicose Veins Neg Hx     Social History  Tobacco Use  . Smoking status: Never Smoker  . Smokeless tobacco: Never Used  Substance Use Topics  . Alcohol use: No    Frequency: Never  . Drug use: No    Allergies:  Allergies  Allergen Reactions  . Blue Dyes (Parenteral) Other (See Comments)    G6PD-- Decreased hemoglobin  . Codeine Other (See Comments)    Decreased hemoglobin  . Other Other (See Comments)    G6PD--Decreased hemoglobin to potato chips (not potatoes in other forms)    Medications Prior to Admission  Medication Sig Dispense Refill Last Dose  . Prenatal Vit-Fe Fumarate-FA (PRENATAL MULTIVITAMIN) TABS tablet Take 1 tablet by mouth daily at 12 noon.   12/30/2017 at Unknown time    Review of Systems  Respiratory: Negative.   Cardiovascular: Negative.   Gastrointestinal: Positive for abdominal pain.  Genitourinary: Negative.   Musculoskeletal: Positive for back pain.  Neurological: Positive for dizziness.  Psychiatric/Behavioral: Negative.    Physical Exam   Blood pressure 125/74, pulse (!) 107, temperature 98.4 F (36.9 C), temperature source Oral, resp. rate 18, height _0  (1.651 m), weight 170 lb 0.3 oz (77.1 kg), last menstrual period  11/30/2017.  Physical Exam  Constitutional: She is oriented to person, place, and time. She appears well-developed.  HENT:  Head: Normocephalic.  Eyes: Pupils are equal, round, and reactive to light.  Neck: Normal range of motion.  GI: Soft.  Genitourinary:  Genitourinary Comments: Normal external female genitalia; no lesions on external genitalia. Speculum exam deferred; no CMT, adnexa or suprapubic tenderness.   Neurological: She is alert and oriented to person, place, and time.  Skin: Skin is warm and dry.  Psychiatric: She has a normal mood and affect.    MAU Course  Procedures  MDM -beta hcg is over 5,000 but [place] not definitive for YS, although gestational sac is visualized.   -multiple fibroids present in uterus  Assessment and Plan   1. Fibroids   2. Abdominal pain affecting pregnancy   3. Pregnancy of unknown anatomic location    2. Explained nature of [place] results and importance of following up in the clinic on Wednesday morning at 8:30 AM for stat beta hcg. Patient knows she will have to wait for restuls.   3. Ectopic precautions reviewed.   4. Outpatient [place] scheduled for 2 weeks from now.   5. Patient will return to clinic if her condition were to change or worsen.   Mervyn Skeeters Shalisha Clausing 12/30/2017, 10:05 PM

## 2017-12-31 LAB — ABO/RH: ABO/RH(D): A POS

## 2018-01-02 ENCOUNTER — Ambulatory Visit: Payer: Medicaid Other

## 2018-01-02 ENCOUNTER — Ambulatory Visit (INDEPENDENT_AMBULATORY_CARE_PROVIDER_SITE_OTHER): Payer: Medicaid Other | Admitting: General Practice

## 2018-01-02 DIAGNOSIS — O283 Abnormal ultrasonic finding on antenatal screening of mother: Secondary | ICD-10-CM

## 2018-01-02 DIAGNOSIS — O3680X0 Pregnancy with inconclusive fetal viability, not applicable or unspecified: Secondary | ICD-10-CM

## 2018-01-02 LAB — HCG, QUANTITATIVE, PREGNANCY: hCG, Beta Chain, Quant, S: 8512 m[IU]/mL — ABNORMAL HIGH (ref <5)

## 2018-01-02 NOTE — Progress Notes (Signed)
Patient presents to office today for stat bhcg. Patient denies bleeding, only reporting lower back pain. Discussed with patient we are monitoring her bhcg levels today and asked she wait in lobby for results/updated plan of care. Patient verbalized understanding & had no questions at this time.  Reviewed results with Kerry Hough who finds slight increase in bhcg levels, patient should have repeat stat bhcg in 48 hours to continue to monitor trend.  Informed patient of results & planned follow up on Friday for repeat stat. Ectopic precautions reviewed. Patient verbalized understanding & had no questions.

## 2018-01-02 NOTE — Progress Notes (Signed)
I have reviewed the chart and agree with nursing staff's documentation of this patient's encounter.  Kerry Hough, PA-C 01/02/2018 11:12 AM

## 2018-01-04 ENCOUNTER — Inpatient Hospital Stay (HOSPITAL_COMMUNITY): Payer: Medicaid Other

## 2018-01-04 ENCOUNTER — Ambulatory Visit (INDEPENDENT_AMBULATORY_CARE_PROVIDER_SITE_OTHER): Payer: Medicaid Other | Admitting: General Practice

## 2018-01-04 ENCOUNTER — Inpatient Hospital Stay (HOSPITAL_COMMUNITY)
Admission: AD | Admit: 2018-01-04 | Discharge: 2018-01-04 | Disposition: A | Discharge status: Home / Self Care | Payer: Medicaid Other | Source: Ambulatory Visit | Attending: Obstetrics and Gynecology | Admitting: Obstetrics and Gynecology

## 2018-01-04 DIAGNOSIS — O3680X0 Pregnancy with inconclusive fetal viability, not applicable or unspecified: Secondary | ICD-10-CM

## 2018-01-04 DIAGNOSIS — O283 Abnormal ultrasonic finding on antenatal screening of mother: Secondary | ICD-10-CM

## 2018-01-04 DIAGNOSIS — O26851 Spotting complicating pregnancy, first trimester: Secondary | ICD-10-CM | POA: Diagnosis not present

## 2018-01-04 DIAGNOSIS — Z3A01 Less than 8 weeks gestation of pregnancy: Secondary | ICD-10-CM | POA: Insufficient documentation

## 2018-01-04 DIAGNOSIS — O26859 Spotting complicating pregnancy, unspecified trimester: Secondary | ICD-10-CM

## 2018-01-04 DIAGNOSIS — N939 Abnormal uterine and vaginal bleeding, unspecified: Secondary | ICD-10-CM | POA: Diagnosis present

## 2018-01-04 LAB — URINALYSIS, ROUTINE W REFLEX MICROSCOPIC
BACTERIA UA: NONE SEEN
BILIRUBIN URINE: NEGATIVE
Glucose, UA: NEGATIVE mg/dL
Hgb urine dipstick: NEGATIVE
Ketones, ur: NEGATIVE mg/dL
Nitrite: NEGATIVE
PH: 6 (ref 5.0–8.0)
Protein, ur: NEGATIVE mg/dL
SPECIFIC GRAVITY, URINE: 1.028 (ref 1.005–1.030)

## 2018-01-04 LAB — HCG, QUANTITATIVE, PREGNANCY: HCG, BETA CHAIN, QUANT, S: 10767 m[IU]/mL — AB (ref <5)

## 2018-01-04 NOTE — Discharge Instructions (Signed)
First Trimester of Pregnancy The first trimester of pregnancy is from week 1 until the end of week 13 (months 1 through 3). During this time, your baby will begin to develop inside you. At 6-8 weeks, the eyes and face are formed, and the heartbeat can be seen on ultrasound. At the end of 12 weeks, all the baby's organs are formed. Prenatal care is all the medical care you receive before the birth of your baby. Make sure you get good prenatal care and follow all of your doctor's instructions. Follow these instructions at home: Medicines  Take over-the-counter and prescription medicines only as told by your doctor. Some medicines are safe and some medicines are not safe during pregnancy.  Take a prenatal vitamin that contains at least 600 micrograms (mcg) of folic acid.  If you have trouble pooping (constipation), take medicine that will make your stool soft (stool softener) if your doctor approves. Eating and drinking  Eat regular, healthy meals.  Your doctor will tell you the amount of weight gain that is right for you.  Avoid raw meat and uncooked cheese.  If you feel sick to your stomach (nauseous) or throw up (vomit): ? Eat 4 or 5 small meals a day instead of 3 large meals. ? Try eating a few soda crackers. ? Drink liquids between meals instead of during meals.  To prevent constipation: ? Eat foods that are high in fiber, like fresh fruits and vegetables, whole grains, and beans. ? Drink enough fluids to keep your pee (urine) clear or pale yellow. Activity  Exercise only as told by your doctor. Stop exercising if you have cramps or pain in your lower belly (abdomen) or low back.  Do not exercise if it is too hot, too humid, or if you are in a place of great height (high altitude).  Try to avoid standing for long periods of time. Move your legs often if you must stand in one place for a long time.  Avoid heavy lifting.  Wear low-heeled shoes. Sit and stand up straight.  You  can have sex unless your doctor tells you not to. Relieving pain and discomfort  Wear a good support bra if your breasts are sore.  Take warm water baths (sitz baths) to soothe pain or discomfort caused by hemorrhoids. Use hemorrhoid cream if your doctor says it is okay.  Rest with your legs raised if you have leg cramps or low back pain.  If you have puffy, bulging veins (varicose veins) in your legs: ? Wear support hose or compression stockings as told by your doctor. ? Raise (elevate) your feet for 15 minutes, 3-4 times a day. ? Limit salt in your food. Prenatal care  Schedule your prenatal visits by the twelfth week of pregnancy.  Write down your questions. Take them to your prenatal visits.  Keep all your prenatal visits as told by your doctor. This is important. Safety  Wear your seat belt at all times when driving.  Make a list of emergency phone numbers. The list should include numbers for family, friends, the hospital, and police and fire departments. General instructions  Ask your doctor for a referral to a local prenatal class. Begin classes no later than at the start of month 6 of your pregnancy.  Ask for help if you need counseling or if you need help with nutrition. Your doctor can give you advice or tell you where to go for help.  Do not use hot tubs, steam rooms, or   saunas.  Do not douche or use tampons or scented sanitary pads.  Do not cross your legs for long periods of time.  Avoid all herbs and alcohol. Avoid drugs that are not approved by your doctor.  Do not use any tobacco products, including cigarettes, chewing tobacco, and electronic cigarettes. If you need help quitting, ask your doctor. You may get counseling or other support to help you quit.  Avoid cat litter boxes and soil used by cats. These carry germs that can cause birth defects in the baby and can cause a loss of your baby (miscarriage) or stillbirth.  Visit your dentist. At home, brush  your teeth with a soft toothbrush. Be gentle when you floss. Contact a doctor if:  You are dizzy.  You have mild cramps or pressure in your lower belly.  You have a nagging pain in your belly area.  You continue to feel sick to your stomach, you throw up, or you have watery poop (diarrhea).  You have a bad smelling fluid coming from your vagina.  You have pain when you pee (urinate).  You have increased puffiness (swelling) in your face, hands, legs, or ankles. Get help right away if:  You have a fever.  You are leaking fluid from your vagina.  You have spotting or bleeding from your vagina.  You have very bad belly cramping or pain.  You gain or lose weight rapidly.  You throw up blood. It may look like coffee grounds.  You are around people who have German measles, fifth disease, or chickenpox.  You have a very bad headache.  You have shortness of breath.  You have any kind of trauma, such as from a fall or a car accident. Summary  The first trimester of pregnancy is from week 1 until the end of week 13 (months 1 through 3).  To take care of yourself and your unborn baby, you will need to eat healthy meals, take medicines only if your doctor tells you to do so, and do activities that are safe for you and your baby.  Keep all follow-up visits as told by your doctor. This is important as your doctor will have to ensure that your baby is healthy and growing well. This information is not intended to replace advice given to you by your health care provider. Make sure you discuss any questions you have with your health care provider. Document Released: 01/10/2008 Document Revised: 08/01/2016 Document Reviewed: 08/01/2016 Elsevier Interactive Patient Education  2017 [employer]  

## 2018-01-04 NOTE — Progress Notes (Signed)
Chart reviewed - agree with RN documentation.   

## 2018-01-04 NOTE — MAU Note (Addendum)
Had BHCG today that rose appropriately but having some pink spotting tonight. Kerry Hough PA in Triage to discuss plan of care with pt. Pt will go to u/s from lobby. Some lower back pain since Monday.

## 2018-01-04 NOTE — MAU Provider Note (Signed)
History     CSN: [id number]  Arrival date and time: 01/04/18 2108   First Provider Initiated Contact with Patient 01/04/18 2210      Chief Complaint  Patient presents with  . Vaginal Bleeding   HPI [name] is a 22 y.o. G1P0 at [redacted]w[redacted]d who presents to MAU today with complaint of light pink spotting noted on tissue today. The patient was seen in MAU on 12/30/17 and US showed IUGS and normal adnexa. Her hCG at that time was 5880. She states that she noted very light pink spotting tonight. She denies any on her pad or underwear. She has mild back pain which is unchanged from previously. She denies abdominal pain or fever.   OB History    Gravida  1   Para      Term      Preterm      AB      Living        SAB      TAB      Ectopic      Multiple      Live Births              Past Medical History:  Diagnosis Date  . 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 Medical history non-contributory     Past Surgical History:  Procedure Laterality Date  . NO PAST SURGERIES      Family History  Problem Relation Age of Onset  . ADD / ADHD Neg Hx   . Anxiety disorder Neg Hx   . Alcohol abuse Neg Hx   . Arthritis Neg Hx   . Asthma Neg Hx   . Cancer Neg Hx   . Birth defects Neg Hx   . Depression Neg Hx   . COPD Neg Hx   . Diabetes Neg Hx   . Drug abuse Neg Hx   . Early death Neg Hx   . Hearing loss Neg Hx   . Heart disease Neg Hx   . Hyperlipidemia Neg Hx   . Hypertension Neg Hx   . Intellectual disability Neg Hx   . Kidney disease Neg Hx   . Learning disabilities Neg Hx   . Miscarriages / Stillbirths Neg Hx   . Stroke Neg Hx   . Obesity Neg Hx   . Vision loss Neg Hx   . Varicose Veins Neg Hx     Social History   Tobacco Use  . Smoking status: Never Smoker  . Smokeless tobacco: Never Used  Substance Use Topics  . Alcohol use: No    Frequency: Never  . Drug use: No    Allergies:  Allergies  Allergen Reactions  . Blue Dyes (Parenteral) Other (See Comments)     G6PD-- Decreased hemoglobin  . Codeine Other (See Comments)    Decreased hemoglobin  . Other Other (See Comments)    G6PD--Decreased hemoglobin to potato chips (not potatoes in other forms)    No medications prior to admission.    Review of Systems  Constitutional: Negative for fever.  Gastrointestinal: Negative for abdominal pain, constipation, diarrhea, nausea and vomiting.  Genitourinary: Positive for vaginal bleeding. Negative for vaginal discharge.  Musculoskeletal: Positive for back pain.   Physical Exam   Blood pressure 102/80, pulse 96, temperature 97.9 F (36.6 C), resp. rate 18, height 5\' 5"  (1.651 m), weight 167 lb (75.8 kg), last menstrual period 11/30/2017.  Physical Exam  Nursing note and vitals reviewed. Constitutional: She is oriented to person,  place, and time. She appears well-developed and well-nourished. No distress.  HENT:  Head: Normocephalic and atraumatic.  Cardiovascular: Normal rate.  Respiratory: Effort normal.  GI: Soft. She exhibits no distension and no mass. There is no tenderness. There is no rebound and no guarding.  Neurological: She is alert and oriented to person, place, and time.  Skin: Skin is warm and dry. No erythema.  Psychiatric: She has a normal mood and affect.   US Ob Comp Less 14 Wks  Result Date: 12/30/2017 CLINICAL DATA:  Cramping since this morning. Estimated gestational age by LMP is 4 weeks 2 days. Quantitative beta HCG was not ordered. EXAM: OBSTETRIC <14 WK [place] AND TRANSVAGINAL OB US TECHNIQUE: Both transabdominal and transvaginal ultrasound examinations were performed for complete evaluation of the gestation as well as the maternal uterus, adnexal regions, and pelvic cul-de-sac. Transvaginal technique was performed to assess early pregnancy. COMPARISON:  None. FINDINGS: Intrauterine gestational sac: A single intrauterine gestational sac is identified. Yolk sac:  Yolk sac is not definitively identified. Embryo:  Not identified.  Cardiac Activity: Not identified. MSD: 3.8 mm   5 w   0 d Subchorionic hemorrhage:  None visualized. Maternal uterus/adnexae: Uterus is anteverted. Diffuse uterine enlargement with multiple myometrial nodules consistent with multiple uterine fibroids. Largest is located posteriorly at 3.4 cm maximal diameter. Possible submucosal fibroids in the lower uterine segment measuring up to 1.5 cm diameter. Both ovaries are visualized and appear normal. Corpus luteal cyst on the left. Minimal free fluid in the pelvis. IMPRESSION: 1. Probable early intrauterine gestational sac, but no yolk sac, fetal pole, or cardiac activity yet visualized. Recommend follow-up quantitative B-HCG levels and follow-up US in 14 days to assess viability. This recommendation follows SRU consensus guidelines: Diagnostic Criteria for Nonviable Pregnancy Early in the First Trimester. [employer] Med 2013; 299:2426-83. 2. Multiple uterine fibroids measuring up to 3.4 cm diameter. Electronically Signed   By: Lucienne Capers M.D.   On: 12/30/2017 23:15   US Ob Transvaginal  Result Date: 01/04/2018 CLINICAL DATA:  Vaginal bleeding.  Pregnant patient.  Low back pain. EXAM: TRANSVAGINAL OB ULTRASOUND TECHNIQUE: Transvaginal ultrasound was performed for complete evaluation of the gestation as well as the maternal uterus, adnexal regions, and pelvic cul-de-sac. COMPARISON:  Dec 30, 2017 FINDINGS: Intrauterine gestational sac: There is an oval fluid collection in the endometrium, possibly an early gestational sac. MSD: 11.3 mm   5 w   6 d CRL:     mm    w  d                  [place] EDC: Subchorionic hemorrhage:  None visualized. Maternal uterus/adnexae: The ovaries are normal in appearance. Multiple fibroids are seen throughout the uterus, also described on the previous study. The largest measures up to 2.9 cm. IMPRESSION: 1. Probable early intrauterine gestational sac, but no yolk sac, fetal pole, or cardiac activity yet visualized. Recommend follow-up  quantitative B-HCG levels and follow-up US in 14 days to assess viability. This recommendation follows SRU consensus guidelines: Diagnostic Criteria for Nonviable Pregnancy Early in the First Trimester. [employer] Med 2013; 419:6222-97. 2. Multiple fibroids in the uterus. Electronically Signed   By: Dorise Bullion III M.D   On: 01/04/2018 23:13   US Ob Transvaginal  Result Date: 12/30/2017 CLINICAL DATA:  Cramping since this morning. Estimated gestational age by LMP is 4 weeks 2 days. Quantitative beta HCG was not ordered. EXAM: OBSTETRIC <14 WK [place] AND TRANSVAGINAL OB  US TECHNIQUE: Both transabdominal and transvaginal ultrasound examinations were performed for complete evaluation of the gestation as well as the maternal uterus, adnexal regions, and pelvic cul-de-sac. Transvaginal technique was performed to assess early pregnancy. COMPARISON:  None. FINDINGS: Intrauterine gestational sac: A single intrauterine gestational sac is identified. Yolk sac:  Yolk sac is not definitively identified. Embryo:  Not identified. Cardiac Activity: Not identified. MSD: 3.8 mm   5 w   0 d Subchorionic hemorrhage:  None visualized. Maternal uterus/adnexae: Uterus is anteverted. Diffuse uterine enlargement with multiple myometrial nodules consistent with multiple uterine fibroids. Largest is located posteriorly at 3.4 cm maximal diameter. Possible submucosal fibroids in the lower uterine segment measuring up to 1.5 cm diameter. Both ovaries are visualized and appear normal. Corpus luteal cyst on the left. Minimal free fluid in the pelvis. IMPRESSION: 1. Probable early intrauterine gestational sac, but no yolk sac, fetal pole, or cardiac activity yet visualized. Recommend follow-up quantitative B-HCG levels and follow-up US in 14 days to assess viability. This recommendation follows SRU consensus guidelines: Diagnostic Criteria for Nonviable Pregnancy Early in the First Trimester. [employer] Med 2013; 226:3335-45. 2. Multiple uterine  fibroids measuring up to 3.4 cm diameter. Electronically Signed   By: Lucienne Capers M.D.   On: 12/30/2017 23:15    MAU Course  Procedures  MDM [place] ordered.  O+ blood type   Assessment and Plan  A: Pregnancy of unknown location Spotting in pregnancy, first trimester Uterine fibroids   P: Discharge home Tylenol PRN for pain  Bleeding/ectopic precautions discussed Patient advised to follow-up with [facility] as scheduled on 01/10/18 for repeat US to attempt to confirm viability  Patient may return to MAU as needed or if her condition were to change or worsen  Kerry Hough, PA-C 01/05/2018, 12:50 AM

## 2018-01-04 NOTE — MAU Note (Signed)
Kerry Hough PA in Triage to discuss test results with pt. D/c instructions given by PA and pt d/c home from Triage

## 2018-01-04 NOTE — Progress Notes (Signed)
Patient presents to office today for repeat stat bhcg. Patient denies pain or bleeding. Discussed with patient we are monitoring her bhcg levels today and asked she wait in lobby for results/updated plan of care. Patient verbalized understanding and had no questions at this time.  Reviewed results with Dr Nehemiah Settle who finds slow to increase bhcg levels- patient should have follow up ultrasound next week. Scheduled for 6/6.  Informed patient of results & scheduled ultrasound appt next week. Ectopic precautions reviewed. Patient verbalized understanding and had no questions.

## 2018-01-10 ENCOUNTER — Ambulatory Visit (HOSPITAL_COMMUNITY)
Admission: RE | Admit: 2018-01-10 | Discharge: 2018-01-10 | Disposition: A | Discharge status: Home / Self Care | Payer: Medicaid Other | Source: Ambulatory Visit | Attending: Family Medicine | Admitting: Family Medicine

## 2018-01-10 ENCOUNTER — Ambulatory Visit (INDEPENDENT_AMBULATORY_CARE_PROVIDER_SITE_OTHER): Payer: Medicaid Other

## 2018-01-10 VITALS — BP 122/67 | HR 90 | Wt 166.0 lb

## 2018-01-10 DIAGNOSIS — O283 Abnormal ultrasonic finding on antenatal screening of mother: Secondary | ICD-10-CM | POA: Insufficient documentation

## 2018-01-10 DIAGNOSIS — O3680X0 Pregnancy with inconclusive fetal viability, not applicable or unspecified: Secondary | ICD-10-CM

## 2018-01-10 DIAGNOSIS — Z3A01 Less than 8 weeks gestation of pregnancy: Secondary | ICD-10-CM | POA: Insufficient documentation

## 2018-01-10 DIAGNOSIS — D259 Leiomyoma of uterus, unspecified: Secondary | ICD-10-CM | POA: Diagnosis not present

## 2018-01-10 DIAGNOSIS — O3411 Maternal care for benign tumor of corpus uteri, first trimester: Secondary | ICD-10-CM | POA: Diagnosis not present

## 2018-01-10 DIAGNOSIS — Z3481 Encounter for supervision of other normal pregnancy, first trimester: Secondary | ICD-10-CM

## 2018-01-10 NOTE — Progress Notes (Signed)
Patient here for ultrasound results.  Results given.  Advised patient to seek prenatal care.  Reviewed things to watch for.

## 2018-01-10 NOTE — Patient Instructions (Signed)
Start prenatal care and prenatal vitamins  You may experience some light spotting, that may be normal.  If you expereince heavy bleeding go to [facility] for evaluation.  If you have cramping you may take tylenol as needed.

## 2018-01-14 ENCOUNTER — Inpatient Hospital Stay (HOSPITAL_COMMUNITY)
Admission: AD | Admit: 2018-01-14 | Discharge: 2018-01-14 | Disposition: A | Discharge status: Home / Self Care | Payer: Medicaid Other | Source: Ambulatory Visit | Attending: Obstetrics and Gynecology | Admitting: Obstetrics and Gynecology

## 2018-01-14 ENCOUNTER — Encounter (HOSPITAL_COMMUNITY): Payer: Self-pay | Admitting: *Deleted

## 2018-01-14 ENCOUNTER — Inpatient Hospital Stay (HOSPITAL_COMMUNITY): Payer: Medicaid Other

## 2018-01-14 DIAGNOSIS — D550 Anemia due to glucose-6-phosphate dehydrogenase [G6PD] deficiency: Secondary | ICD-10-CM

## 2018-01-14 DIAGNOSIS — O3411 Maternal care for benign tumor of corpus uteri, first trimester: Secondary | ICD-10-CM | POA: Diagnosis not present

## 2018-01-14 DIAGNOSIS — O418X10 Other specified disorders of amniotic fluid and membranes, first trimester, not applicable or unspecified: Secondary | ICD-10-CM

## 2018-01-14 DIAGNOSIS — D219 Benign neoplasm of connective and other soft tissue, unspecified: Secondary | ICD-10-CM

## 2018-01-14 DIAGNOSIS — O209 Hemorrhage in early pregnancy, unspecified: Secondary | ICD-10-CM

## 2018-01-14 DIAGNOSIS — Z885 Allergy status to narcotic agent status: Secondary | ICD-10-CM | POA: Diagnosis not present

## 2018-01-14 DIAGNOSIS — O99011 Anemia complicating pregnancy, first trimester: Secondary | ICD-10-CM | POA: Insufficient documentation

## 2018-01-14 DIAGNOSIS — D259 Leiomyoma of uterus, unspecified: Secondary | ICD-10-CM | POA: Diagnosis not present

## 2018-01-14 DIAGNOSIS — O208 Other hemorrhage in early pregnancy: Secondary | ICD-10-CM | POA: Insufficient documentation

## 2018-01-14 DIAGNOSIS — N939 Abnormal uterine and vaginal bleeding, unspecified: Secondary | ICD-10-CM | POA: Diagnosis present

## 2018-01-14 DIAGNOSIS — Z3A01 Less than 8 weeks gestation of pregnancy: Secondary | ICD-10-CM | POA: Insufficient documentation

## 2018-01-14 DIAGNOSIS — O468X1 Other antepartum hemorrhage, first trimester: Secondary | ICD-10-CM | POA: Diagnosis not present

## 2018-01-14 HISTORY — DX: Benign neoplasm of connective and other soft tissue, unspecified: D21.9

## 2018-01-14 LAB — URINALYSIS, ROUTINE W REFLEX MICROSCOPIC
BACTERIA UA: NONE SEEN
Bilirubin Urine: NEGATIVE
Glucose, UA: NEGATIVE mg/dL
KETONES UR: NEGATIVE mg/dL
Nitrite: NEGATIVE
PROTEIN: NEGATIVE mg/dL
Specific Gravity, Urine: 1.027 (ref 1.005–1.030)
pH: 6 (ref 5.0–8.0)

## 2018-01-14 MED ORDER — FERROUS SULFATE 325 (65 FE) MG PO TABS: 325.0000 mg | ORAL_TABLET | Freq: Two times a day (BID) | ORAL | 1 refills | Status: DC

## 2018-01-14 MED ORDER — DOCUSATE SODIUM 100 MG PO CAPS: 100.0000 mg | ORAL_CAPSULE | Freq: Two times a day (BID) | ORAL | 2 refills | Status: DC | PRN

## 2018-01-14 NOTE — MAU Note (Signed)
Pt reports vaginal bleeding with some clots x 1.5 hours

## 2018-01-14 NOTE — MAU Provider Note (Signed)
Chief Complaint: Vaginal Bleeding   First Provider Initiated Contact with Patient 01/14/18 1554      SUBJECTIVE HPI: Sandra Macdonald is a 22 y.o. G1P0 at [redacted]w[redacted]d by LMP who presents to maternity admissions reporting onset of heavier bleeding with clots 2 hours before presenting to MAU. She was seen on 01/10/18 with vaginal bleeding and US showed viable IUP with moderate subchorionic hemorrhage. She also was noted to have fibroid uterus. Pt presents today because the blood clots worried her and she was concerned that this could be more serious.  She reports wearing a light pad but is not soaking pads with the bleeding.  There are no other associated symptoms. She has not tried any treatments.  She has known G6PD deficiency anemia and is taking PNV, but no additional iron at this time.    HPI  Past Medical History:  Diagnosis Date  . Fibroid   . G6PD deficiency ([place])   . Medical history non-contributory    Past Surgical History:  Procedure Laterality Date  . NO PAST SURGERIES     Social History   Socioeconomic History  . Marital status: Single    Spouse name: Not on file  . Number of children: Not on file  . Years of education: Not on file  . Highest education level: Not on file  Occupational History  . Not on file  Social Needs  . Financial resource strain: Not on file  . Food insecurity:    Worry: Not on file    Inability: Not on file  . Transportation needs:    Medical: Not on file    Non-medical: Not on file  Tobacco Use  . Smoking status: Never Smoker  . Smokeless tobacco: Never Used  Substance and Sexual Activity  . Alcohol use: No    Frequency: Never  . Drug use: No  . Sexual activity: Yes    Birth control/protection: None  Lifestyle  . Physical activity:    Days per week: Not on file    Minutes per session: Not on file  . Stress: Not on file  Relationships  . Social connections:    Talks on phone: Not on file    Gets together: Not on file    Attends religious  service: Not on file    Active member of club or organization: Not on file    Attends meetings of clubs or organizations: Not on file    Relationship status: Not on file  . Intimate partner violence:    Fear of current or ex partner: Not on file    Emotionally abused: Not on file    Physically abused: Not on file    Forced sexual activity: Not on file  Other Topics Concern  . Not on file  Social History Narrative  . Not on file   No current facility-administered medications on file prior to encounter.    Current Outpatient Medications on File Prior to Encounter  Medication Sig Dispense Refill  . Prenatal Vit-Fe Fumarate-FA (PRENATAL MULTIVITAMIN) TABS tablet Take 1 tablet by mouth daily at 12 noon.     Allergies  Allergen Reactions  . Blue Dyes (Parenteral) Other (See Comments)    G6PD-- Decreased hemoglobin  . Codeine Other (See Comments)    Decreased hemoglobin  . Other Other (See Comments)    G6PD--Decreased hemoglobin to potato chips (not potatoes in other forms)    ROS:  Review of Systems  Constitutional: Negative for chills, fatigue and fever.  Respiratory: Negative  for shortness of breath.   Cardiovascular: Negative for chest pain.  Gastrointestinal: Negative for nausea and vomiting.  Genitourinary: Positive for vaginal bleeding. Negative for difficulty urinating, dysuria, flank pain, pelvic pain, vaginal discharge and vaginal pain.  Neurological: Negative for dizziness and headaches.  Psychiatric/Behavioral: Negative.      I have reviewed patient's Past Medical Hx, Surgical Hx, Family Hx, Social Hx, medications and allergies.   Physical Exam   Patient Vitals for the past 24 hrs:  BP Temp Temp src Pulse Resp SpO2 Weight  01/14/18 1826 113/64 - - 90 18 - -  01/14/18 1323 122/62 98.1 F (36.7 C) Oral 90 16 100 % 169 lb 0.6 oz (76.7 kg)   Constitutional: Well-developed, well-nourished female in no acute distress.  Cardiovascular: normal rate Respiratory:  normal effort GI: Abd soft, non-tender. Pos BS x 4 MS: Extremities nontender, no edema, normal ROM Neurologic: Alert and oriented x 4.  GU: Neg CVAT.  PELVIC EXAM: Deferred, pad visualized and scant dark red bleeding with small clots on pad x 2 hours    LAB RESULTS Results for orders placed or performed during the hospital encounter of 01/14/18 (from the past 24 hour(s))  Urinalysis, Routine w reflex microscopic     Status: Abnormal   Collection Time: 01/14/18  1:40 PM  Result Value Ref Range   Color, Urine AMBER (A) YELLOW   APPearance HAZY (A) CLEAR   Specific Gravity, Urine 1.027 1.005 - 1.030   pH 6.0 5.0 - 8.0   Glucose, UA NEGATIVE NEGATIVE mg/dL   Hgb urine dipstick MODERATE (A) NEGATIVE   Bilirubin Urine NEGATIVE NEGATIVE   Ketones, ur NEGATIVE NEGATIVE mg/dL   Protein, ur NEGATIVE NEGATIVE mg/dL   Nitrite NEGATIVE NEGATIVE   Leukocytes, UA SMALL (A) NEGATIVE   RBC / HPF 0-5 0 - 5 RBC/hpf   WBC, UA 6-10 0 - 5 WBC/hpf   Bacteria, UA NONE SEEN NONE SEEN   Squamous Epithelial / LPF 11-20 0 - 5   Mucus PRESENT     --/--/A POS Performed at [facility], [address], [place], [place] [address]  [phone]  IMAGING US Ob Comp Less 14 Wks  Result Date: 12/30/2017 CLINICAL DATA:  Cramping since this morning. Estimated gestational age by LMP is 4 weeks 2 days. Quantitative beta HCG was not ordered. EXAM: OBSTETRIC <14 WK [place] AND TRANSVAGINAL OB US TECHNIQUE: Both transabdominal and transvaginal ultrasound examinations were performed for complete evaluation of the gestation as well as the maternal uterus, adnexal regions, and pelvic cul-de-sac. Transvaginal technique was performed to assess early pregnancy. COMPARISON:  None. FINDINGS: Intrauterine gestational sac: A single intrauterine gestational sac is identified. Yolk sac:  Yolk sac is not definitively identified. Embryo:  Not identified. Cardiac Activity: Not identified. MSD: 3.8 mm   5 w   0 d Subchorionic  hemorrhage:  None visualized. Maternal uterus/adnexae: Uterus is anteverted. Diffuse uterine enlargement with multiple myometrial nodules consistent with multiple uterine fibroids. Largest is located posteriorly at 3.4 cm maximal diameter. Possible submucosal fibroids in the lower uterine segment measuring up to 1.5 cm diameter. Both ovaries are visualized and appear normal. Corpus luteal cyst on the left. Minimal free fluid in the pelvis. IMPRESSION: 1. Probable early intrauterine gestational sac, but no yolk sac, fetal pole, or cardiac activity yet visualized. Recommend follow-up quantitative B-HCG levels and follow-up US in 14 days to assess viability. This recommendation follows SRU consensus guidelines: Diagnostic Criteria for Nonviable Pregnancy Early in the First Trimester. N  [place] [employer] Med 2013; 369:1443-51. 2. Multiple uterine fibroids measuring up to 3.4 cm diameter. Electronically Signed   By: Lucienne Capers M.D.   On: 12/30/2017 23:15   US Ob Transvaginal  Result Date: 01/14/2018 CLINICAL DATA:  Vaginal bleeding, passing clots, [redacted] weeks pregnant EXAM: TRANSVAGINAL OB ULTRASOUND TECHNIQUE: Transvaginal ultrasound was performed for complete evaluation of the gestation as well as the maternal uterus, adnexal regions, and pelvic cul-de-sac. COMPARISON:  None. FINDINGS: Intrauterine gestational sac: Single Yolk sac:  Visualized. Embryo:  Visualized. Cardiac Activity: Visualized. Heart Rate: 133 bpm CRL:   6.9 mm   6 w 3 d                  [place] EDC: 09/06/2018 Subchorionic hemorrhage:  Small subchorionic hemorrhage noted. Maternal uterus/adnexae: Single viable IUP estimated at 6 weeks 3 days gestational age by crown rump length. Ovaries appear normal. Multiple uterine fibroids evident largest measuring 3 cm. Trace pelvic free fluid. IMPRESSION: Single living IUP. Small subchorionic hemorrhage. Estimated gestational age [redacted] weeks 3 days. Uterine fibroids Trace pelvic free fluid Electronically Signed   By: Jerilynn Mages.   Shick M.D.   On: 01/14/2018 17:51   US Ob Transvaginal  Result Date: 01/10/2018 CLINICAL DATA:  Pregnancy of unknown anatomic location EXAM: TRANSVAGINAL OB ULTRASOUND TECHNIQUE: Transvaginal ultrasound was performed for complete evaluation of the gestation as well as the maternal uterus, adnexal regions, and pelvic cul-de-sac. COMPARISON:  01/04/2018 FINDINGS: Intrauterine gestational sac: Present, single Yolk sac:  Present Embryo:  Present Cardiac Activity: Present Heart Rate: 110 bpm CRL:   3.1 mm   5 w 6 d                  [place] EDC: 09/06/2017 Subchorionic hemorrhage:  Subchronic hemorrhage 30 x 5 x 16 mm Maternal uterus/adnexae: Uterine leiomyomata again identified largest intramural 3.6 x 2.5 x 3.8 cm. LEFT ovary normal size and morphology 2.4 x 3.8 x 2.3 cm. RIGHT ovary normal size and morphology 3.8 x 1.4 x 1.5 cm. Trace free pelvic fluid. No adnexal masses. IMPRESSION: Single live intrauterine gestation as above. Moderate subchronic hemorrhage. Uterine leiomyomata. Electronically Signed   By: Lavonia Dana M.D.   On: 01/10/2018 09:43   US Ob Transvaginal  Result Date: 01/04/2018 CLINICAL DATA:  Vaginal bleeding.  Pregnant patient.  Low back pain. EXAM: TRANSVAGINAL OB ULTRASOUND TECHNIQUE: Transvaginal ultrasound was performed for complete evaluation of the gestation as well as the maternal uterus, adnexal regions, and pelvic cul-de-sac. COMPARISON:  Dec 30, 2017 FINDINGS: Intrauterine gestational sac: There is an oval fluid collection in the endometrium, possibly an early gestational sac. MSD: 11.3 mm   5 w   6 d CRL:     mm    w  d                  [place] EDC: Subchorionic hemorrhage:  None visualized. Maternal uterus/adnexae: The ovaries are normal in appearance. Multiple fibroids are seen throughout the uterus, also described on the previous study. The largest measures up to 2.9 cm. IMPRESSION: 1. Probable early intrauterine gestational sac, but no yolk sac, fetal pole, or cardiac activity yet  visualized. Recommend follow-up quantitative B-HCG levels and follow-up US in 14 days to assess viability. This recommendation follows SRU consensus guidelines: Diagnostic Criteria for Nonviable Pregnancy Early in the First Trimester. [place] [employer] Med 2013; 295:2841-32. 2. Multiple fibroids in the uterus. Electronically Signed   By: Dorise Bullion III M.D   On: 01/04/2018  23:13   US Ob Transvaginal  Result Date: 12/30/2017 CLINICAL DATA:  Cramping since this morning. Estimated gestational age by LMP is 4 weeks 2 days. Quantitative beta HCG was not ordered. EXAM: OBSTETRIC <14 WK [place] AND TRANSVAGINAL OB US TECHNIQUE: Both transabdominal and transvaginal ultrasound examinations were performed for complete evaluation of the gestation as well as the maternal uterus, adnexal regions, and pelvic cul-de-sac. Transvaginal technique was performed to assess early pregnancy. COMPARISON:  None. FINDINGS: Intrauterine gestational sac: A single intrauterine gestational sac is identified. Yolk sac:  Yolk sac is not definitively identified. Embryo:  Not identified. Cardiac Activity: Not identified. MSD: 3.8 mm   5 w   0 d Subchorionic hemorrhage:  None visualized. Maternal uterus/adnexae: Uterus is anteverted. Diffuse uterine enlargement with multiple myometrial nodules consistent with multiple uterine fibroids. Largest is located posteriorly at 3.4 cm maximal diameter. Possible submucosal fibroids in the lower uterine segment measuring up to 1.5 cm diameter. Both ovaries are visualized and appear normal. Corpus luteal cyst on the left. Minimal free fluid in the pelvis. IMPRESSION: 1. Probable early intrauterine gestational sac, but no yolk sac, fetal pole, or cardiac activity yet visualized. Recommend follow-up quantitative B-HCG levels and follow-up US in 14 days to assess viability. This recommendation follows SRU consensus guidelines: Diagnostic Criteria for Nonviable Pregnancy Early in the First Trimester. [place] [employer] Med 2013;  470:9628-36. 2. Multiple uterine fibroids measuring up to 3.4 cm diameter. Electronically Signed   By: Lucienne Capers M.D.   On: 12/30/2017 23:15    MAU Management/MDM: [place] today indicates viable pregnancy, subchorionic hemorrhage described as small today, decreased from moderate hemorrhage on previous US.  Likely bleeding is related to [facility] and may continue during pregnancy. Fibroids may contribute to bleeding/cramping.  Discussed [facility] with pt, what to expect with bleeding/cramping and reasons to seek medical care. Pt to return if heavy bleeding soaking 1 pad/hour or with severe abdominal pain. Otherwise, keep scheduled prenatal appt with [facility]. Pt discharged with strict bleeding/return precautions.  ASSESSMENT 1. Subchorionic hemorrhage of placenta in first trimester, single or unspecified fetus   2. Vaginal bleeding in pregnancy, first trimester   3. G6PD deficiency anemia (H[facility])   4. Fibroids     PLAN Discharge home Allergies as of 01/14/2018      Reactions   Blue Dyes (parenteral) Other (See Comments)   G6PD-- Decreased hemoglobin   Codeine Other (See Comments)   Decreased hemoglobin   Other Other (See Comments)   G6PD--Decreased hemoglobin to potato chips (not potatoes in other forms)      Medication List    TAKE these medications   docusate sodium 100 MG capsule Commonly known as:  COLACE Take 1 capsule (100 mg total) by mouth 2 (two) times daily as needed.   ferrous sulfate 325 (65 FE) MG tablet Commonly known as:  FERROUSUL Take 1 tablet (325 mg total) by mouth 2 (two) times daily.   prenatal multivitamin Tabs tablet Take 1 tablet by mouth daily at 12 noon.      Follow-up [place] for [place] Follow up.   Specialty:  Obstetrics and Gynecology Why:  As scheduled, return to MAU as needed for emergencies Contact information: [place] Certified  Nurse-Midwife 01/14/2018  8:29 PM

## 2018-02-13 ENCOUNTER — Telehealth: Payer: Self-pay | Admitting: Licensed Clinical Social Worker

## 2018-02-13 NOTE — Telephone Encounter (Signed)
Unable to reach pt regarding appt reminder for 02/18/2018

## 2018-02-18 ENCOUNTER — Other Ambulatory Visit (HOSPITAL_COMMUNITY)
Admission: RE | Admit: 2018-02-18 | Discharge: 2018-02-18 | Disposition: A | Discharge status: Home / Self Care | Payer: Medicaid Other | Source: Ambulatory Visit | Attending: Advanced Practice Midwife | Admitting: Advanced Practice Midwife

## 2018-02-18 ENCOUNTER — Encounter: Payer: Self-pay | Admitting: *Deleted

## 2018-02-18 ENCOUNTER — Encounter: Payer: Self-pay | Admitting: Advanced Practice Midwife

## 2018-02-18 ENCOUNTER — Ambulatory Visit (INDEPENDENT_AMBULATORY_CARE_PROVIDER_SITE_OTHER): Payer: Medicaid Other | Admitting: Advanced Practice Midwife

## 2018-02-18 ENCOUNTER — Ambulatory Visit (INDEPENDENT_AMBULATORY_CARE_PROVIDER_SITE_OTHER): Payer: Medicaid Other | Admitting: Clinical

## 2018-02-18 VITALS — BP 112/61 | HR 79 | Wt 170.6 lb

## 2018-02-18 DIAGNOSIS — F4323 Adjustment disorder with mixed anxiety and depressed mood: Secondary | ICD-10-CM

## 2018-02-18 DIAGNOSIS — Z3480 Encounter for supervision of other normal pregnancy, unspecified trimester: Secondary | ICD-10-CM

## 2018-02-18 DIAGNOSIS — Z3400 Encounter for supervision of normal first pregnancy, unspecified trimester: Secondary | ICD-10-CM | POA: Insufficient documentation

## 2018-02-18 DIAGNOSIS — F329 Major depressive disorder, single episode, unspecified: Secondary | ICD-10-CM

## 2018-02-18 DIAGNOSIS — Z3481 Encounter for supervision of other normal pregnancy, first trimester: Secondary | ICD-10-CM | POA: Diagnosis not present

## 2018-02-18 DIAGNOSIS — Z3401 Encounter for supervision of normal first pregnancy, first trimester: Secondary | ICD-10-CM

## 2018-02-18 DIAGNOSIS — F32A Depression, unspecified: Secondary | ICD-10-CM | POA: Insufficient documentation

## 2018-02-18 HISTORY — DX: Encounter for supervision of normal first pregnancy, unspecified trimester: Z34.00

## 2018-02-18 LAB — POCT URINALYSIS DIP (DEVICE)
Bilirubin Urine: NEGATIVE
GLUCOSE, UA: NEGATIVE mg/dL
Hgb urine dipstick: NEGATIVE
KETONES UR: NEGATIVE mg/dL
Nitrite: NEGATIVE
PH: 6 (ref 5.0–8.0)
PROTEIN: NEGATIVE mg/dL
SPECIFIC GRAVITY, URINE: 1.025 (ref 1.005–1.030)
UROBILINOGEN UA: 0.2 mg/dL (ref 0.0–1.0)

## 2018-02-18 NOTE — Progress Notes (Signed)
Subjective:   Sandra Macdonald is a 22 y.o. G1P0 at [redacted]w[redacted]d by LMP, early ultrasound being seen today for her first obstetrical visit.  Her obstetrical history is significant for teen pregnancy. Patient does not intend to breast feed. Pregnancy history fully reviewed.  Patient reports no complaints.  HISTORY: OB History  Gravida Para Term Preterm AB Living  1 0 0 0 0 0  SAB TAB Ectopic Multiple Live Births  0 0 0 0 0    # Outcome Date GA Lbr Len/2nd Weight Sex Delivery Anes PTL Lv  1 Current             Last pap smear was done NA, age and was NA, age   Past Medical History:  Diagnosis Date  . Depression   . Fibroid   . G6PD deficiency ([place])   . Medical history non-contributory    Past Surgical History:  Procedure Laterality Date  . NO PAST SURGERIES     Family History  Problem Relation Age of Onset  . Mental illness Mother   . ADD / ADHD Neg Hx   . Anxiety disorder Neg Hx   . Alcohol abuse Neg Hx   . Arthritis Neg Hx   . Asthma Neg Hx   . Cancer Neg Hx   . Birth defects Neg Hx   . Depression Neg Hx   . COPD Neg Hx   . Diabetes Neg Hx   . Drug abuse Neg Hx   . Early death Neg Hx   . Hearing loss Neg Hx   . Heart disease Neg Hx   . Hyperlipidemia Neg Hx   . Hypertension Neg Hx   . Intellectual disability Neg Hx   . Kidney disease Neg Hx   . Learning disabilities Neg Hx   . Miscarriages / Stillbirths Neg Hx   . Stroke Neg Hx   . Obesity Neg Hx   . Vision loss Neg Hx   . Varicose Veins Neg Hx    Social History   Tobacco Use  . Smoking status: Never Smoker  . Smokeless tobacco: Never Used  Substance Use Topics  . Alcohol use: No    Frequency: Never  . Drug use: No   Allergies  Allergen Reactions  . Blue Dyes (Parenteral) Other (See Comments)    G6PD-- Decreased hemoglobin  . Codeine Other (See Comments)    Decreased hemoglobin  . Other Other (See Comments)    G6PD--Decreased hemoglobin to potato chips (not potatoes in other forms)   Current Outpatient  Medications on File Prior to Visit  Medication Sig Dispense Refill  . ferrous sulfate (FERROUSUL) 325 (65 FE) MG tablet Take 1 tablet (325 mg total) by mouth 2 (two) times daily. 60 tablet 1  . Prenatal Vit-Fe Fumarate-FA (PRENATAL MULTIVITAMIN) TABS tablet Take 1 tablet by mouth daily at 12 noon.    . docusate sodium (COLACE) 100 MG capsule Take 1 capsule (100 mg total) by mouth 2 (two) times daily as needed. (Patient not taking: Reported on 02/18/2018) 30 capsule 2   No current facility-administered medications on file prior to visit.     Review of Systems Pertinent items noted in HPI and remainder of comprehensive ROS otherwise negative.  Exam   Vitals:   02/18/18 0851  BP: 112/61  Pulse: 79  Weight: 170 lb 9.6 oz (77.4 kg)   Fetal Heart Rate (bpm): 160  Uterus:     Pelvic Exam: Perineum: no hemorrhoids, normal perineum   Vulva: normal external  genitalia, no lesions   Vagina:  normal mucosa, normal discharge   Cervix:    Adnexa:    Bony Pelvis:   System: General: well-developed, well-nourished female in no acute distress   Breast:  normal appearance, no masses or tenderness   Skin: normal coloration and turgor, no rashes   Neurologic: oriented, normal, negative, normal mood   Extremities: normal strength, tone, and muscle mass, ROM of all joints is normal   HEENT PERRLA, extraocular movement intact and sclera clear, anicteric   Mouth/Teeth mucous membranes moist, pharynx normal without lesions and dental hygiene good   Neck supple and no masses   Cardiovascular: regular rate and rhythm   Respiratory:  no respiratory distress, normal breath sounds   Abdomen: soft, non-tender; bowel sounds normal; no masses,  no organomegaly    Assessment:   Pregnancy: G1P0 Patient Active Problem List   Diagnosis Date Noted  . Supervision of normal first pregnancy, antepartum 02/18/2018  . Depression 02/18/2018  . G6PD deficiency anemia ([place]) 01/14/2018  . Fibroids 12/30/2017      Plan:  1. Supervision of other normal pregnancy, antepartum - Routine care - Obstetric Panel, Including HIV - HgB A1c - SMN1 Copy Number Analysis - Hemoglobinopathy Evaluation - Cystic Fibrosis Mutation 97 - Genetic Screening - Culture, OB Urine - Cervicovaginal ancillary only  2. Supervision of normal first pregnancy, antepartum    Initial labs drawn. Continue prenatal vitamins. Genetic Screening discussed, NIPS: requested. Ultrasound discussed; fetal anatomic survey: requested. Problem list reviewed and updated. The nature of [place] with multiple MDs and other Advanced Practice Providers was explained to patient; also emphasized that residents, students are part of our team. Routine obstetric precautions reviewed. 50% of 45 min visit spent in counseling and coordination of care. No follow-ups on file.

## 2018-02-18 NOTE — BH Specialist Note (Signed)
Integrated Behavioral Health Initial Visit  MRN: [id number] Name: Sandra Macdonald  Number of [place] Clinician visits:: 1/6 Session Start time: 9:48  Session End time: 10:12 Total time: 20 minutes  Type of Service: [place] Interpretor:No. Interpretor Name and Language: n/a   Warm Hand Off Completed.       SUBJECTIVE: Sandra Macdonald is a 22 y.o. female accompanied by n/a Patient was referred by Marcille Buffy, CNM for new OB intro to [facility] services. Patient reports the following symptoms/concerns: Pt states her primary concern today is irritability and lack of motivation/interest, attributes to early pregnancy. Pt is eating, sleeping, and functioning well.  Duration of problem: Current pregnancy; Severity of problem: mild  OBJECTIVE: Mood: Anxious and Depressed and Affect: Appropriate Risk of harm to self or others: No plan to harm self or others  LIFE CONTEXT: Family and Social: Pt is one of 5 siblings; pt's mother experiences bipolar disorder and schizophrenia School/Work: - Self-Care: No particular coping strategy used with symptoms  Life Changes: Current pregnancy  GOALS ADDRESSED: Patient will: 1. Reduce symptoms of: anxiety and depression 2. Increase knowledge and/or ability of: self-management skills  3. Demonstrate ability to: Increase healthy adjustment to current life circumstances and Increase adequate support systems for patient/family  INTERVENTIONS: Interventions utilized: Solution-Focused Strategies, Psychoeducation and/or Health Education and Link to [employer]  Standardized Assessments completed: GAD-7 and PHQ 9  ASSESSMENT: Patient currently experiencing Adjustment disorder with mixed anxious and depressed mood.   Patient may benefit from psychoeducation and brief therapeutic interventions regarding coping with symptoms of anxiety and depression.  PLAN: 1. Follow up with behavioral health  clinician on : If symptoms increase and/or do not improve 2. Behavioral recommendations:  -Continue taking prenatal vitamins and iron, as recommended by medical provider -Read educational materials regarding coping with symptoms of anxiety and depression -Consider [facility] Family-to-Family Support groups/classes as additional support 3. Referral(s): [place] (In Clinic) and [employer] Resources:  NAMI 4. "From scale of 1-10, how likely are you to follow plan?": 8  Garlan Fair, LCSW  Depression screen [facility] 2/9 02/18/2018  Decreased Interest 3  Down, Depressed, Hopeless 1  PHQ - 2 Score 4  Altered sleeping 1  Tired, decreased energy 2  Change in appetite 2  Feeling bad or failure about yourself  1  Trouble concentrating 0  Moving slowly or fidgety/restless 0  Suicidal thoughts 0  PHQ-9 Score 10   GAD 7 : Generalized Anxiety Score 02/18/2018  Nervous, Anxious, on Edge 0  Control/stop worrying 1  Worry too much - different things 1  Trouble relaxing 0  Restless 0  Easily annoyed or irritable 3  Afraid - awful might happen 0  Total GAD 7 Score 5

## 2018-02-18 NOTE — Progress Notes (Signed)
Here for new ob. Given new patient booklets. States has spotted a few times, last about a week ago- and it was bright red. Patient and/or legal guardian verbally consented to meet with [place] about presenting concerns.( elevated phq9 and hx depression). Declines babyscripts optimization. Would like app. Has MyChart. PMH form completed.

## 2018-02-18 NOTE — Patient Instructions (Signed)
Childbirth Education Options: [facility] Department Classes:  Childbirth education classes can help you get ready for a positive parenting experience. You can also meet other expectant parents and get free stuff for your baby. Each class runs for five weeks on the same night and costs $45 for the mother-to-be and her support person. Medicaid covers the cost if you are eligible. Call [phone] to register. [facility] Childbirth Education:  [phone] or [phone] or [email or web address]  Baby & Me Class: Discuss newborn & infant parenting and family adjustment issues with other new mothers in a relaxed environment. Each week brings a new speaker or baby-centered activity. We encourage new mothers to join [place] every Thursday at 11:00am. Babies birth until crawling. No registration or fee. Daddy [employer]: This course offers Dads-to-be the tools and knowledge needed to feel confident on their journey to becoming new fathers. Experienced dads, who have been trained as coaches, teach dads-to-be how to hold, comfort, diaper, swaddle and play with their infant while being able to support the new mom as well. A class for men taught by men. $25/dad Big Brother/Big Sister: Let your children share in the joy of a new brother or sister in this special class designed just for them. Class includes discussion about how families care for babies: swaddling, holding, diapering, safety as well as how they can be helpful in their new role. This class is designed for children ages 45 to 48, but any age is welcome. Please register each child individually. $5/child  Mom Talk: This mom-led group offers support and connection to mothers as they journey through the adjustments and struggles of that sometimes overwhelming first year after the birth of a child. Tuesdays at 10:00am and Thursdays at 6:00pm. Babies welcome. No registration or fee. Breastfeeding Support Group: This group is a mother-to-mother  support circle where moms have the opportunity to share their breastfeeding experiences. A Lactation Consultant is present for questions and concerns. Meets each Tuesday at 11:00am. No fee or registration. Breastfeeding Your Baby: Learn what to expect in the first days of breastfeeding your newborn.  This class will help you feel more confident with the skills needed to begin your breastfeeding experience. Many new mothers are concerned about breastfeeding after leaving the hospital. This class will also address the most common fears and challenges about breastfeeding during the first few weeks, months and beyond. (call for fee) Comfort Techniques and Tour: This 2 hour interactive class will provide you the opportunity to learn & practice hands-on techniques that can help relieve some of the discomfort of labor and encourage your baby to rotate toward the best position for birth. You and your partner will be able to try a variety of labor positions with birth balls and rebozos as well as practice breathing, relaxation, and visualization techniques. A tour of the [facility] is included with this class. $20 per registrant and support person Childbirth Class- Weekend Option: This class is a Weekend version of our Birth & Baby series. It is designed for parents who have a difficult time fitting several weeks of classes into their schedule. It covers the care of your newborn and the basics of labor and childbirth. It also includes a [place] [facility] and lunch. The class is held two consecutive days: beginning on Friday evening from 6:30 - 8:30 p.m. and the next day, Saturday from 9 a.m. - 4 p.m. (call for fee) Doren Custard Class: Interested in a waterbirth?  This  informational class will help you discover whether waterbirth is the right fit for you. Education about waterbirth itself, supplies you would need and how to assemble your support team is what you can  expect from this class. Some obstetrical practices require this class in order to pursue a waterbirth. (Not all obstetrical practices offer waterbirth-check with your healthcare provider.) Register only the expectant mom, but you are encouraged to bring your partner to class! Required if planning waterbirth, no fee. Infant/Child CPR: Parents, grandparents, babysitters, and friends learn Cardio-Pulmonary Resuscitation skills for infants and children. You will also learn how to treat both conscious and unconscious choking in infants and children. This Family & Friends program does not offer certification. Register each participant individually to ensure that enough mannequins are available. (Call for fee) Grandparent Love: Expecting a grandbaby? This class is for you! Learn about the latest infant care and safety recommendations and ways to support your own child as he or she transitions into the parenting role. Taught by Registered Nurses who are childbirth instructors, but most importantly...they are grandmothers too! $10/person. Childbirth Class- Natural Childbirth: This series of 5 weekly classes is for expectant parents who want to learn and practice natural methods of coping with the process of labor and childbirth. Relaxation, breathing, massage, visualization, role of the partner, and helpful positioning are highlighted. Participants learn how to be confident in their body's ability to give birth. This class will empower and help parents make informed decisions about their own care. Includes discussion that will help new parents transition into the immediate postpartum period. Maternity Care Center Tour of [facility] is included. We suggest taking this class between 25-32 weeks, but it's only a recommendation. $75 per registrant and one support person or $30 Medicaid. Childbirth Class- 3 week Series: This option of 3 weekly classes helps you and your labor partner prepare for childbirth. Newborn  care, labor & birth, cesarean birth, pain management, and comfort techniques are discussed and a Maternity Care Center Tour of [facility] is included. The class meets at the same time, on the same day of the week for 3 consecutive weeks beginning with the starting date you choose. $60 for registrant and one support person.  Marvelous Multiples: Expecting twins, triplets, or more? This class covers the differences in labor, birth, parenting, and breastfeeding issues that face multiples' parents. NICU tour is included. Led by a Certified Childbirth Educator who is the mother of twins. No fee. Caring for Baby: This class is for expectant and adoptive parents who want to learn and practice the most up-to-date newborn care for their babies. Focus is on birth through the first six weeks of life. Topics include feeding, bathing, diapering, crying, umbilical cord care, circumcision care and safe sleep. Parents learn to recognize symptoms of illness and when to call the pediatrician. Register only the mom-to-be and your partner or support person can plan to come with you! $10 per registrant and support person Childbirth Class- online option: This online class offers you the freedom to complete a Birth and Baby series in the comfort of your own home. The flexibility of this option allows you to review sections at your own pace, at times convenient to you and your support people. It includes additional video information, animations, quizzes, and extended activities. Get organized with helpful eClass tools, checklists, and trackers. Once you register online for the class, you will receive an email within a few days to accept the invitation and begin the class when the time   is right for you. The content will be available to you for 60 days. $60 for 60 days of online access for you and your support people.  Local Doulas: Natural Baby Doulas [email or web address] Tel:  [phone] [email or web address] [employer] [phone] [email or web address].[email or web address] The Labor Hassell Halim  (also do waterbirth tub rental) [phone] [email or web address] Triad Birth Doula [phone] [email or web address] NotebookDistributors.fi Sacred Rhythms  ([phone] https://sacred-rhythms.com/ [employer] (PADA) [email or web address] https://www.frey.org/ [place] Birth and Baby  http://[email or web address]/ Considering Waterbirth? Guide for patients at [facility] [employer]  Why consider waterbirth?  . Gentle birth for babies . Less pain medicine used in labor . May allow for passive descent/less pushing . May reduce perineal tears  . More mobility and instinctive maternal position changes . Increased maternal relaxation . Reduced blood pressure in labor  Is waterbirth safe? What are the risks of infection, drowning or other complications?  . Infection: o Very low risk (3.7 % for tub vs 4.8% for bed) o 7 in 8000 waterbirths with documented infection o Poorly cleaned equipment most common cause o Slightly lower group B strep transmission rate  . Drowning o Maternal:  - Very low risk   - Related to seizures or fainting o Newborn:  - Very low risk. No evidence of increased risk of respiratory problems in multiple large studies - Physiological protection from breathing under water - Avoid underwater birth if there are any fetal complications - Once baby's head is out of the water, keep it out.  . Birth complication o Some reports of cord trauma, but risk decreased by bringing baby to surface gradually o No evidence of increased risk of shoulder dystocia. Mothers can usually change positions faster in water than in a bed, possibly aiding the maneuvers to free the shoulder.   You must attend a Doren Custard class at [facility]  3rd Wednesday of every month from 7-9pm  [employer] by calling [phone] or online at [email or web address]  Bring [place] the certificate from the class to your prenatal appointment  Meet with a midwife at 36 weeks to see if you can still plan a waterbirth and to sign the consent.   Purchase or rent the following supplies:   Water Birth Pool (Birth Pool in a Box or [place] for instance)  (Tubs start ~$125)  Single-use disposable tub liner designed for your brand of tub  New garden hose labeled "lead-free", "suitable for drinking water",  Electric drain pump to remove water (We recommend 792 gallon per hour or greater pump.)   Separate garden hose to remove the dirty water  Fish net  Bathing suit top (optional)  Long-handled mirror (optional)  Places to purchase or rent supplies  GotWebTools.is for tub purchases and supplies  Waterbirthsolutions.com for tub purchases and supplies  The Labor Ladies (www.thelaborladies.com) $275 for tub rental/set-up & take down/kit   [employer] (http://www.fleming.com/.htm) Information regarding doulas (labor support) who provide pool rentals  Our practice has a Birth Pool in a Box tub at the hospital that you may borrow on a first-come-first-served basis. It is your responsibility to to set up, clean and break down the tub. We cannot guarantee the availability of this tub in advance. You are responsible for bringing all accessories listed above. If you do not have all necessary supplies you cannot have a waterbirth.    Things that would prevent you from having a waterbirth:  Premature, <37wks  Previous cesarean birth  Presence of thick meconium-stained fluid  Multiple gestation (Twins,  triplets, etc.)  Uncontrolled diabetes or gestational diabetes requiring medication  Hypertension requiring medication or diagnosis of pre-eclampsia  Heavy vaginal bleeding  Non-reassuring fetal  heart rate  Active infection (MRSA, etc.). Group B Strep is NOT a contraindication for  waterbirth.  If your labor has to be induced and induction method requires continuous  monitoring of the baby's heart rate  Other risks/issues identified by your obstetrical provider  Please remember that birth is unpredictable. Under certain unforeseeable circumstances your provider may advise against giving birth in the tub. These decisions will be made on a case-by-case basis and with the safety of you and your baby as our highest priority.   AREA PEDIATRIC/FAMILY [place] [address] [place], [place]  [address] Phone - [phone]   Fax - [phone]  [facility] [address] [place], [place] [address] Phone - [phone]   Fax - [place] [address] [place], [place]  [address] Phone - [phone]   Fax - [phone]  [place]. [address] [place] [address] [place], [place]  [address] Phone - [phone]   Fax - [phone]  [place] [address] [place], [place]  [address] Phone - [phone]   Fax - [phone]  [facility] [address] [place], [place]  [address] Phone - [phone]   Fax - [place] [address], [place], [place]  [address] Phone - [phone]   Fax - [phone]  [place] [address] [place], [place] [address] [place], [place]  [address] Phone - [phone]   Fax - [place] [address] [place], [place]  [address] Phone - [phone]   Fax - [phone] [facility] [place]. [address] [place], [place]  [address] Phone - [phone]   Fax - [phone]  [place] [address] [place], [place]  [address] Phone -  [phone]   Fax - [phone]  [facility] AT [place], [place], [place]  [address] Phone - [phone]   Fax - [place] [address], [place], [place]  [address] Phone - [phone]   Fax - [phone]  [facility] [address], [place], [place]  [address] Phone - [place], [place]  [address] Phone - [phone]   Fax - [place] [address], [place], [place]  [address] Phone - [phone]   Fax - [phone]  [place] [address] [place], [place]  [address] Phone - [phone]   Fax - [place]. [place], [place]  [address] Phone - [phone]   Fax - [place], [place], [place]  [address] Phone - [phone]   Fax - [place] [address], [place], [place]  [address] Phone - [phone]   Fax - [phone]  [place] [address] [place],   [address] Phone - [phone]   Fax - [place] [address], [place],   [address] Phone - [phone]   Fax - [phone]  [place] [phone]  [place], [place]  [address] Phone - [phone]   Fax - [phone] [place] [phone] [address] [place], [place]  [address] Phone - [phone]   Fax - [place] [address] [place], [place]  [address] Phone - [phone]   Fax - [place] [address] [place], [place]  [address] Phone - [phone]   Fax - [phone]  [place] MD [address] [place] [address] Phone [phone]  Fax [phone]

## 2018-02-19 LAB — CERVICOVAGINAL ANCILLARY ONLY
Chlamydia: NEGATIVE
Neisseria Gonorrhea: NEGATIVE

## 2018-02-20 LAB — CULTURE, OB URINE

## 2018-02-20 LAB — URINE CULTURE, OB REFLEX

## 2018-02-26 LAB — OBSTETRIC PANEL, INCLUDING HIV
Antibody Screen: NEGATIVE
Basophils Absolute: 0 10*3/uL (ref 0.0–0.2)
Basos: 0 %
EOS (ABSOLUTE): 0 10*3/uL (ref 0.0–0.4)
Eos: 1 %
HEMATOCRIT: 33.6 % — AB (ref 34.0–46.6)
HEP B S AG: NEGATIVE
HIV SCREEN 4TH GENERATION: NONREACTIVE
Hemoglobin: 10.8 g/dL — ABNORMAL LOW (ref 11.1–15.9)
IMMATURE GRANS (ABS): 0 10*3/uL (ref 0.0–0.1)
Immature Granulocytes: 0 %
LYMPHS: 22 %
Lymphocytes Absolute: 1.3 10*3/uL (ref 0.7–3.1)
MCH: 27.7 pg (ref 26.6–33.0)
MCHC: 32.1 g/dL (ref 31.5–35.7)
MCV: 86 fL (ref 79–97)
MONOCYTES: 10 %
Monocytes Absolute: 0.6 10*3/uL (ref 0.1–0.9)
NEUTROS ABS: 3.9 10*3/uL (ref 1.4–7.0)
Neutrophils: 67 %
Platelets: 314 10*3/uL (ref 150–450)
RBC: 3.9 x10E6/uL (ref 3.77–5.28)
RDW: 20.3 % — ABNORMAL HIGH (ref 12.3–15.4)
RPR: NONREACTIVE
RUBELLA: 3 {index} (ref >0.99)
Rh Factor: POSITIVE
WBC: 5.9 10*3/uL (ref 3.4–10.8)

## 2018-02-26 LAB — CYSTIC FIBROSIS MUTATION 97: GENE DIS ANAL CARRIER INTERP BLD/T-IMP: NOT DETECTED

## 2018-02-26 LAB — SMN1 COPY NUMBER ANALYSIS (SMA CARRIER SCREENING)

## 2018-02-26 LAB — HEMOGLOBINOPATHY EVALUATION
FERRITIN: 40 ng/mL (ref 15–77)
HGB A2 QUANT: 2.3 % (ref 1.8–3.2)
HGB A: 97.7 % (ref 96.4–98.8)
HGB SOLUBILITY: NEGATIVE
HGB VARIANT: 0 %
Hgb C: 0 %
Hgb F Quant: 0 % (ref 0.0–2.0)
Hgb S: 0 %

## 2018-02-26 LAB — HEMOGLOBIN A1C
Est. average glucose Bld gHb Est-mCnc: <74 mg/dL
Hgb A1c MFr Bld: <4.2 % — ABNORMAL LOW (ref 4.8–5.6)

## 2018-03-03 ENCOUNTER — Other Ambulatory Visit: Payer: Self-pay

## 2018-03-03 ENCOUNTER — Inpatient Hospital Stay (HOSPITAL_COMMUNITY)
Admission: AD | Admit: 2018-03-03 | Discharge: 2018-03-03 | Disposition: A | Discharge status: Home / Self Care | Payer: Medicaid Other | Source: Ambulatory Visit | Attending: Obstetrics & Gynecology | Admitting: Obstetrics & Gynecology

## 2018-03-03 ENCOUNTER — Encounter (HOSPITAL_COMMUNITY): Payer: Self-pay

## 2018-03-03 DIAGNOSIS — O9989 Other specified diseases and conditions complicating pregnancy, childbirth and the puerperium: Secondary | ICD-10-CM

## 2018-03-03 DIAGNOSIS — G44209 Tension-type headache, unspecified, not intractable: Secondary | ICD-10-CM | POA: Insufficient documentation

## 2018-03-03 DIAGNOSIS — O99341 Other mental disorders complicating pregnancy, first trimester: Secondary | ICD-10-CM | POA: Insufficient documentation

## 2018-03-03 DIAGNOSIS — R51 Headache: Secondary | ICD-10-CM | POA: Diagnosis present

## 2018-03-03 DIAGNOSIS — F329 Major depressive disorder, single episode, unspecified: Secondary | ICD-10-CM | POA: Insufficient documentation

## 2018-03-03 DIAGNOSIS — O26891 Other specified pregnancy related conditions, first trimester: Secondary | ICD-10-CM | POA: Diagnosis not present

## 2018-03-03 DIAGNOSIS — Z3A13 13 weeks gestation of pregnancy: Secondary | ICD-10-CM

## 2018-03-03 LAB — URINALYSIS, ROUTINE W REFLEX MICROSCOPIC
Bilirubin Urine: NEGATIVE
GLUCOSE, UA: NEGATIVE mg/dL
Hgb urine dipstick: NEGATIVE
Ketones, ur: NEGATIVE mg/dL
Nitrite: NEGATIVE
PROTEIN: NEGATIVE mg/dL
Specific Gravity, Urine: 1.014 (ref 1.005–1.030)
pH: 6 (ref 5.0–8.0)

## 2018-03-03 MED ORDER — BUTALBITAL-APAP-CAFFEINE 50-325-40 MG PO TABS
1.0000 | ORAL_TABLET | Freq: Once | ORAL | Status: DC
  Filled 2018-03-03: qty 1

## 2018-03-03 MED ORDER — TRAMADOL HCL 50 MG PO TABS
50.0000 mg | ORAL_TABLET | Freq: Once | ORAL | Status: AC
  Administered 2018-03-03: 50 mg via ORAL
  Filled 2018-03-03: qty 1

## 2018-03-03 NOTE — MAU Provider Note (Signed)
History     CSN: [id number]  Arrival date and time: 03/03/18 1635   First Provider Initiated Contact with Patient 03/03/18 1725       Chief Complaint  Patient presents with  . Headache   HPI [name] is a 22 y.o. G1P0 at [redacted]w[redacted]d who presents to MAU today with complaint of headache since last night. She tried Tylenol this morning once without relief. She denies history of HTN or migraines. She has not had any other severe headaches during her pregnancy. She denies abdominal pain or vaginal bleeding today.   OB History    Gravida  1   Para      Term      Preterm      AB      Living        SAB      TAB      Ectopic      Multiple      Live Births              Past Medical History:  Diagnosis Date  . Depression   . Fibroid   . G6PD deficiency ([place])   . Medical history non-contributory     Past Surgical History:  Procedure Laterality Date  . NO PAST SURGERIES      Family History  Problem Relation Age of Onset  . Mental illness Mother   . ADD / ADHD Neg Hx   . Anxiety disorder Neg Hx   . Alcohol abuse Neg Hx   . Arthritis Neg Hx   . Asthma Neg Hx   . Cancer Neg Hx   . Birth defects Neg Hx   . Depression Neg Hx   . COPD Neg Hx   . Diabetes Neg Hx   . Drug abuse Neg Hx   . Early death Neg Hx   . Hearing loss Neg Hx   . Heart disease Neg Hx   . Hyperlipidemia Neg Hx   . Hypertension Neg Hx   . Intellectual disability Neg Hx   . Kidney disease Neg Hx   . Learning disabilities Neg Hx   . Miscarriages / Stillbirths Neg Hx   . Stroke Neg Hx   . Obesity Neg Hx   . Vision loss Neg Hx   . Varicose Veins Neg Hx     Social History   Tobacco Use  . Smoking status: Never Smoker  . Smokeless tobacco: Never Used  Substance Use Topics  . Alcohol use: No    Frequency: Never  . Drug use: No    Allergies:  Allergies  Allergen Reactions  . Blue Dyes (Parenteral) Other (See Comments)    G6PD-- Decreased hemoglobin  . Codeine Other (See  Comments)    Decreased hemoglobin  . Other Other (See Comments)    G6PD--Decreased hemoglobin to potato chips (not potatoes in other forms)    Medications Prior to Admission  Medication Sig Dispense Refill Last Dose  . docusate sodium (COLACE) 100 MG capsule Take 1 capsule (100 mg total) by mouth 2 (two) times daily as needed. (Patient not taking: Reported on 02/18/2018) 30 capsule 2 Not Taking  . ferrous sulfate (FERROUSUL) 325 (65 FE) MG tablet Take 1 tablet (325 mg total) by mouth 2 (two) times daily. 60 tablet 1 Taking  . Prenatal Vit-Fe Fumarate-FA (PRENATAL MULTIVITAMIN) TABS tablet Take 1 tablet by mouth daily at 12 noon.   Taking    Review of Systems  Constitutional: Negative for fever.  Eyes: Negative for photophobia and visual disturbance.  Gastrointestinal: Positive for nausea. Negative for abdominal pain, constipation, diarrhea and vomiting.  Genitourinary: Negative for vaginal bleeding and vaginal discharge.  Neurological: Positive for headaches.   Physical Exam   Blood pressure 125/64, pulse 99, temperature 98.1 F (36.7 C), temperature source Oral, resp. rate 18, weight 169 lb (76.7 kg), last menstrual period 11/30/2017.  Physical Exam  Nursing note and vitals reviewed. Constitutional: She is oriented to person, place, and time. She appears well-developed and well-nourished. No distress.  HENT:  Head: Normocephalic and atraumatic.  Cardiovascular: Normal rate.  Respiratory: Effort normal.  GI: Soft. She exhibits no distension and no mass. There is no tenderness. There is no rebound and no guarding.  Neurological: She is alert and oriented to person, place, and time.  Skin: Skin is warm and dry. No erythema.  Psychiatric: She has a normal mood and affect.     Results for orders placed or performed during the hospital encounter of 03/03/18 (from the past 24 hour(s))  Urinalysis, Routine w reflex microscopic     Status: Abnormal   Collection Time: 03/03/18  5:12 PM   Result Value Ref Range   Color, Urine YELLOW YELLOW   APPearance CLOUDY (A) CLEAR   Specific Gravity, Urine 1.014 1.005 - 1.030   pH 6.0 5.0 - 8.0   Glucose, UA NEGATIVE NEGATIVE mg/dL   Hgb urine dipstick NEGATIVE NEGATIVE   Bilirubin Urine NEGATIVE NEGATIVE   Ketones, ur NEGATIVE NEGATIVE mg/dL   Protein, ur NEGATIVE NEGATIVE mg/dL   Nitrite NEGATIVE NEGATIVE   Leukocytes, UA LARGE (A) NEGATIVE   RBC / HPF 0-5 0 - 5 RBC/hpf   WBC, UA >50 (H) 0 - 5 WBC/hpf   Bacteria, UA RARE (A) NONE SEEN   Squamous Epithelial / LPF 21-50 0 - 5   Amorphous Crystal PRESENT     MAU Course  Procedures None  MDM FHR - 163 bpm with doppler UA today  Confirmed with PharmD due to G6PD, recommends Tramadol PO for headache.  Tramadol PO given. Patient reports resolution of headache.  Urine culture ordered   Assessment and Plan  A: SIUP at [redacted]w[redacted]d Headache, tension-type, not intractable  P:  Discharge home Tylenol PRN for pain advised If headaches become more persistent will consider Rx for Tramadol  First trimester precautions discussed Patient advised to [facility] with [facility] as scheduled for routine prenatal care  Patient may return to MAU as needed or if her condition were to change or worsen  Kerry Hough, PA-C 03/03/2018, 7:36 PM

## 2018-03-03 NOTE — MAU Note (Signed)
Severe headache since last night, intermittent, 7/10, tried tylenol once with no relief

## 2018-03-03 NOTE — Discharge Instructions (Signed)
Tension Headache A tension headache is pain, pressure, or aching that is felt over the front and sides of your head. These headaches can last from 30 minutes to several days. Follow these instructions at home: Managing pain  Take over-the-counter and prescription medicines only as told by your doctor.  Lie down in a dark, quiet room when you have a headache.  If directed, apply ice to your head and neck area: ? Put ice in a plastic bag. ? Place a towel between your skin and the bag. ? Leave the ice on for 20 minutes, 2-3 times per day.  Use a heating pad or a hot shower to apply heat to your head and neck area as told by your doctor. Eating and drinking  Eat meals on a regular schedule.  Do not drink a lot of alcohol.  Do not use a lot of caffeine, or stop using caffeine. General instructions  Keep all follow-up visits as told by your doctor. This is important.  Keep a journal to find out if certain things bring on headaches. For example, write down: ? What you eat and drink. ? How much sleep you get. ? Any change to your diet or medicines.  Try getting a massage, or doing other things that help you to relax.  Lessen stress.  Sit up straight. Do not tighten (tense) your muscles.  Do not use tobacco products. This includes cigarettes, chewing tobacco, or e-cigarettes. If you need help quitting, ask your doctor.  Exercise regularly as told by your doctor.  Get enough sleep. This may mean 7-9 hours of sleep. Contact a doctor if:  Your symptoms are not helped by medicine.  You have a headache that feels different from your usual headache.  You feel sick to your stomach (nauseous) or you throw up (vomit).  You have a fever. Get help right away if:  Your headache becomes very bad.  You keep throwing up.  You have a stiff neck.  You have trouble seeing.  You have trouble speaking.  You have pain in your eye or ear.  Your muscles are weak or you lose muscle  control.  You lose your balance or you have trouble walking.  You feel like you will pass out (faint) or you pass out.  You have confusion. This information is not intended to replace advice given to you by your health care provider. Make sure you discuss any questions you have with your health care provider. Document Released: 10/18/2009 Document Revised: 03/23/2016 Document Reviewed: 11/16/2014 Elsevier Interactive Patient Education  2018 Elsevier Inc.  

## 2018-03-04 ENCOUNTER — Encounter (HOSPITAL_COMMUNITY): Payer: Self-pay

## 2018-03-04 ENCOUNTER — Inpatient Hospital Stay (HOSPITAL_COMMUNITY)
Admission: AD | Admit: 2018-03-04 | Discharge: 2018-03-04 | Disposition: A | Discharge status: Home / Self Care | Payer: Medicaid Other | Source: Ambulatory Visit | Attending: Family Medicine | Admitting: Family Medicine

## 2018-03-04 DIAGNOSIS — O9989 Other specified diseases and conditions complicating pregnancy, childbirth and the puerperium: Secondary | ICD-10-CM

## 2018-03-04 DIAGNOSIS — Z3400 Encounter for supervision of normal first pregnancy, unspecified trimester: Secondary | ICD-10-CM

## 2018-03-04 DIAGNOSIS — Z3A13 13 weeks gestation of pregnancy: Secondary | ICD-10-CM | POA: Insufficient documentation

## 2018-03-04 DIAGNOSIS — O26891 Other specified pregnancy related conditions, first trimester: Secondary | ICD-10-CM | POA: Diagnosis present

## 2018-03-04 DIAGNOSIS — F329 Major depressive disorder, single episode, unspecified: Secondary | ICD-10-CM

## 2018-03-04 DIAGNOSIS — O2341 Unspecified infection of urinary tract in pregnancy, first trimester: Secondary | ICD-10-CM | POA: Diagnosis not present

## 2018-03-04 DIAGNOSIS — R51 Headache: Secondary | ICD-10-CM | POA: Insufficient documentation

## 2018-03-04 DIAGNOSIS — Z3402 Encounter for supervision of normal first pregnancy, second trimester: Secondary | ICD-10-CM

## 2018-03-04 DIAGNOSIS — Z885 Allergy status to narcotic agent status: Secondary | ICD-10-CM | POA: Diagnosis not present

## 2018-03-04 DIAGNOSIS — F32A Depression, unspecified: Secondary | ICD-10-CM

## 2018-03-04 DIAGNOSIS — G44209 Tension-type headache, unspecified, not intractable: Secondary | ICD-10-CM

## 2018-03-04 LAB — URINALYSIS, ROUTINE W REFLEX MICROSCOPIC
BILIRUBIN URINE: NEGATIVE
GLUCOSE, UA: NEGATIVE mg/dL
HGB URINE DIPSTICK: NEGATIVE
Ketones, ur: NEGATIVE mg/dL
NITRITE: NEGATIVE
PROTEIN: NEGATIVE mg/dL
Specific Gravity, Urine: 1.017 (ref 1.005–1.030)
pH: 6 (ref 5.0–8.0)

## 2018-03-04 MED ORDER — CEPHALEXIN 500 MG PO CAPS: 500.0000 mg | ORAL_CAPSULE | Freq: Four times a day (QID) | ORAL | 0 refills | Status: DC

## 2018-03-04 MED ORDER — ACETAMINOPHEN 500 MG PO TABS
1000.0000 mg | ORAL_TABLET | Freq: Once | ORAL | Status: AC
  Administered 2018-03-04: 1000 mg via ORAL
  Filled 2018-03-04: qty 2

## 2018-03-04 NOTE — Discharge Instructions (Signed)
Second Trimester of Pregnancy The second trimester is from week 13 through week 28, month 4 through 6. This is often the time in pregnancy that you feel your best. Often times, morning sickness has lessened or quit. You may have more energy, and you may get hungry more often. Your unborn baby (fetus) is growing rapidly. At the end of the sixth month, he or she is about 9 inches long and weighs about 1 pounds. You will likely feel the baby move (quickening) between 18 and 20 weeks of pregnancy. Follow these instructions at home:  Avoid all smoking, herbs, and alcohol. Avoid drugs not approved by your doctor.  Do not use any tobacco products, including cigarettes, chewing tobacco, and electronic cigarettes. If you need help quitting, ask your doctor. You may get counseling or other support to help you quit.  Only take medicine as told by your doctor. Some medicines are safe and some are not during pregnancy.  Exercise only as told by your doctor. Stop exercising if you start having cramps.  Eat regular, healthy meals.  Wear a good support bra if your breasts are tender.  Do not use hot tubs, steam rooms, or saunas.  Wear your seat belt when driving.  Avoid raw meat, uncooked cheese, and liter boxes and soil used by cats.  Take your prenatal vitamins.  Take 1500-2000 milligrams of calcium daily starting at the 20th week of pregnancy until you deliver your baby.  Try taking medicine that helps you poop (stool softener) as needed, and if your doctor approves. Eat more fiber by eating fresh fruit, vegetables, and whole grains. Drink enough fluids to keep your pee (urine) clear or pale yellow.  Take warm water baths (sitz baths) to soothe pain or discomfort caused by hemorrhoids. Use hemorrhoid cream if your doctor approves.  If you have puffy, bulging veins (varicose veins), wear support hose. Raise (elevate) your feet for 15 minutes, 3-4 times a day. Limit salt in your diet.  Avoid heavy  lifting, wear low heals, and sit up straight.  Rest with your legs raised if you have leg cramps or low back pain.  Visit your dentist if you have not gone during your pregnancy. Use a soft toothbrush to brush your teeth. Be gentle when you floss.  You can have sex (intercourse) unless your doctor tells you not to.  Go to your doctor visits. Get help if:  You feel dizzy.  You have mild cramps or pressure in your lower belly (abdomen).  You have a nagging pain in your belly area.  You continue to feel sick to your stomach (nauseous), throw up (vomit), or have watery poop (diarrhea).  You have bad smelling fluid coming from your vagina.  You have pain with peeing (urination). Get help right away if:  You have a fever.  You are leaking fluid from your vagina.  You have spotting or bleeding from your vagina.  You have severe belly cramping or pain.  You lose or gain weight rapidly.  You have trouble catching your breath and have chest pain.  You notice sudden or extreme puffiness (swelling) of your face, hands, ankles, feet, or legs.  You have not felt the baby move in over an hour.  You have severe headaches that do not go away with medicine.  You have vision changes. This information is not intended to replace advice given to you by your health care provider. Make sure you discuss any questions you have with your health care   provider. Document Released: 10/18/2009 Document Revised: 12/30/2015 Document Reviewed: 09/24/2012 Elsevier Interactive Patient Education  2017 [employer]  

## 2018-03-04 NOTE — MAU Provider Note (Signed)
History     CSN: [id number]  Arrival date and time: 03/04/18 1404   First Provider Initiated Contact with Patient 03/04/18 1424      Chief Complaint  Patient presents with  . Headache   HPI  Sandra Macdonald is a 22 y.o. G1P0 at [redacted]w[redacted]d who presents to MAU with chief complaint of "throbbing" unilateral headache on her left side. This is an existing problem. Patient states she was treated for similar headache yesterday in MAU. Experienced relief with Tramadol given in MAU. Was able to sleep overnight but woke up this morning with 8-9/10 pain, does not radiate. Unable to identify aggravating or alleviating factors.  Reports taking Tylenol late last night but has not taken any medication today.  Patient reports one episode of nausea and vomiting this morning. Reports she has been exposed to a close family member with a cold and feels congested today.  Denies vaginal bleeding, leaking of fluid,fever, falls, or recent illness.    OB History    Gravida  1   Para      Term      Preterm      AB      Living        SAB      TAB      Ectopic      Multiple      Live Births              Past Medical History:  Diagnosis Date  . Depression   . Fibroid   . G6PD deficiency [facility])     Past Surgical History:  Procedure Laterality Date  . NO PAST SURGERIES      Family History  Problem Relation Age of Onset  . Mental illness Mother   . ADD / ADHD Neg Hx   . Anxiety disorder Neg Hx   . Alcohol abuse Neg Hx   . Arthritis Neg Hx   . Asthma Neg Hx   . Cancer Neg Hx   . Birth defects Neg Hx   . Depression Neg Hx   . COPD Neg Hx   . Diabetes Neg Hx   . Drug abuse Neg Hx   . Early death Neg Hx   . Hearing loss Neg Hx   . Heart disease Neg Hx   . Hyperlipidemia Neg Hx   . Hypertension Neg Hx   . Intellectual disability Neg Hx   . Kidney disease Neg Hx   . Learning disabilities Neg Hx   . Miscarriages / Stillbirths Neg Hx   . Stroke Neg Hx   . Obesity Neg Hx   . Vision  loss Neg Hx   . Varicose Veins Neg Hx     Social History   Tobacco Use  . Smoking status: Never Smoker  . Smokeless tobacco: Never Used  Substance Use Topics  . Alcohol use: No    Frequency: Never  . Drug use: No    Allergies:  Allergies  Allergen Reactions  . Blue Dyes (Parenteral) Other (See Comments)    G6PD-- Decreased hemoglobin  . Codeine Other (See Comments)    Decreased hemoglobin  . Other Other (See Comments)    G6PD--Decreased hemoglobin to potato chips (not potatoes in other forms)    Medications Prior to Admission  Medication Sig Dispense Refill Last Dose  . acetaminophen (TYLENOL) 325 MG tablet Take 325 mg by mouth every 6 (six) hours as needed for moderate pain.   03/03/2018 at Unknown time  .  Prenatal Vit-Fe Fumarate-FA (PRENATAL MULTIVITAMIN) TABS tablet Take 1 tablet by mouth daily at 12 noon.   03/04/2018 at Unknown time  . ferrous sulfate (FERROUSUL) 325 (65 FE) MG tablet Take 1 tablet (325 mg total) by mouth 2 (two) times daily. (Patient not taking: Reported on 03/04/2018) 60 tablet 1 Not Taking at Unknown time    Review of Systems  Constitutional: Negative for fatigue and fever.  Gastrointestinal: Positive for nausea and vomiting.  Genitourinary: Negative for vaginal bleeding, vaginal discharge and vaginal pain.  Neurological: Positive for headaches.  All other systems reviewed and are negative.  Physical Exam   Blood pressure 132/77, pulse (!) 117, temperature 97.6 F (36.4 C), weight 168 lb (76.2 kg), last menstrual period 11/30/2017, SpO2 100 %.  Physical Exam  Nursing note and vitals reviewed. Constitutional: She is oriented to person, place, and time. She appears well-developed and well-nourished.  Cardiovascular: Normal rate, regular rhythm, normal heart sounds and intact distal pulses.  Respiratory: Effort normal and breath sounds normal.  GI: Soft. Bowel sounds are normal. She exhibits no distension and no mass. There is no tenderness. There  is no rebound and no guarding.  Genitourinary:  Genitourinary Comments: Not assessed   Neurological: She is alert and oriented to person, place, and time. She has normal reflexes.  Skin: Skin is warm and dry.  Psychiatric: She has a normal mood and affect. Her behavior is normal. Judgment and thought content normal.    MAU Course  Procedures  MDM Headache resolved with Tylenol given in MAU FHT 160 by doppler  Patient Vitals for the past 24 hrs:  BP Temp Pulse SpO2 Weight  03/04/18 1421 - - (!) 117 - -  03/04/18 1420 132/77 97.6 F (36.4 C) (!) 122 100 % -  03/04/18 1413 - - - - 168 lb (76.2 kg)    Orders Placed This Encounter  Procedures  . Urinalysis, Routine w reflex microscopic   Results for orders placed or performed during the hospital encounter of 03/04/18 (from the past 24 hour(s))  Urinalysis, Routine w reflex microscopic     Status: Abnormal   Collection Time: 03/04/18  2:36 PM  Result Value Ref Range   Color, Urine YELLOW YELLOW   APPearance CLOUDY (A) CLEAR   Specific Gravity, Urine 1.017 1.005 - 1.030   pH 6.0 5.0 - 8.0   Glucose, UA NEGATIVE NEGATIVE mg/dL   Hgb urine dipstick NEGATIVE NEGATIVE   Bilirubin Urine NEGATIVE NEGATIVE   Ketones, ur NEGATIVE NEGATIVE mg/dL   Protein, ur NEGATIVE NEGATIVE mg/dL   Nitrite NEGATIVE NEGATIVE   Leukocytes, UA LARGE (A) NEGATIVE   RBC / HPF 0-5 0 - 5 RBC/hpf   WBC, UA 21-50 0 - 5 WBC/hpf   Bacteria, UA FEW (A) NONE SEEN   Squamous Epithelial / LPF 21-50 0 - 5   Mucus PRESENT    Meds ordered this encounter  Medications  . acetaminophen (TYLENOL) tablet 1,000 mg  . cephALEXin (KEFLEX) 500 MG capsule    Sig: Take 1 capsule (500 mg total) by mouth 4 (four) times daily.    Dispense:  40 capsule    Refill:  0    Order Specific Question:   Supervising Provider    Answer:   Donnamae Jude [[id number]]    Assessment and Plan  --22 y.o. G1P0 at [redacted]w[redacted]d  --UTI in pregnancy, rx to patient pharmacy, preexisting order for urine  culture --Headache, tension-type, not intractable, resolved with PO Tylenol in MAU --Reviewed  safe medications in pregnancy --Reviewed general obstetric precautions including but not limited to falls, fever, vaginal bleeding, leaking of fluid, headache not relieved by Tylenol, rest and PO hydration. --Discharge home in stable condition  Darlina Rumpf, [place] 03/04/2018, 4:56 PM

## 2018-03-04 NOTE — MAU Note (Signed)
Pt states she's still having headaches and now feeling dizzy. Also states she's having some cramping.

## 2018-03-05 LAB — CULTURE, OB URINE

## 2018-03-06 ENCOUNTER — Telehealth: Payer: Self-pay | Admitting: Medical

## 2018-03-06 ENCOUNTER — Encounter: Payer: Self-pay | Admitting: *Deleted

## 2018-03-06 NOTE — Telephone Encounter (Signed)
Patient returned call to MAU stating she received a message about either a yeast infection or a UTI needing treatment and wanted to confirm the details. After review of the patient's chart the urine culture from Sunday did not show evidence of infection. The patient was advised that treatment would not be needed at this time. She is asymptomatic. She was not tested for yeast because she was asymptomatic for that at the time as well. Patient voiced understanding.   Luvenia Redden, PA-C 03/06/2018 11:36 AM

## 2018-03-07 ENCOUNTER — Telehealth: Payer: Self-pay | Admitting: Nurse Practitioner

## 2018-03-07 NOTE — Telephone Encounter (Signed)
Client called as her pharmacy notified her of a prescription.  She was questioning the prescription and did she need it.  Chart reviewed with notes and labs.  Explained that the prescription was done once we saw the results of the urinalysis.  Then the urine culture came back and there was no UTI on culture so she does not need the prescription for Keflex.  Client asked my credentials and I explained again the reason she does not need the prescription at her pharmacy.  Earlie Server, RN, MSN, NP-BC Nurse Practitioner, [facility] for [employer], [place] [facility] 03/07/2018 6:22 PM

## 2018-03-12 ENCOUNTER — Encounter: Payer: Self-pay | Admitting: Advanced Practice Midwife

## 2018-03-18 ENCOUNTER — Ambulatory Visit (INDEPENDENT_AMBULATORY_CARE_PROVIDER_SITE_OTHER): Payer: Self-pay | Admitting: Advanced Practice Midwife

## 2018-03-18 ENCOUNTER — Encounter: Payer: Self-pay | Admitting: Advanced Practice Midwife

## 2018-03-18 ENCOUNTER — Other Ambulatory Visit (HOSPITAL_COMMUNITY)
Admission: RE | Admit: 2018-03-18 | Discharge: 2018-03-18 | Disposition: A | Discharge status: Home / Self Care | Payer: Medicaid Other | Source: Ambulatory Visit | Attending: Advanced Practice Midwife | Admitting: Advanced Practice Midwife

## 2018-03-18 VITALS — BP 137/69 | HR 102 | Wt 171.0 lb

## 2018-03-18 DIAGNOSIS — O26892 Other specified pregnancy related conditions, second trimester: Secondary | ICD-10-CM | POA: Diagnosis present

## 2018-03-18 DIAGNOSIS — N898 Other specified noninflammatory disorders of vagina: Secondary | ICD-10-CM | POA: Diagnosis not present

## 2018-03-18 DIAGNOSIS — Z3402 Encounter for supervision of normal first pregnancy, second trimester: Secondary | ICD-10-CM

## 2018-03-18 DIAGNOSIS — Z3400 Encounter for supervision of normal first pregnancy, unspecified trimester: Secondary | ICD-10-CM

## 2018-03-18 DIAGNOSIS — Z3A15 15 weeks gestation of pregnancy: Secondary | ICD-10-CM | POA: Diagnosis not present

## 2018-03-18 NOTE — Progress Notes (Signed)
   PRENATAL VISIT NOTE  Subjective:  Sandra Macdonald is a 22 y.o. G1P0 at [redacted]w[redacted]d being seen today for ongoing prenatal care.  She is currently monitored for the following issues for this low-risk pregnancy and has Fibroids; G6PD deficiency anemia ([place]); Supervision of normal first pregnancy, antepartum; and Depression on their problem list.  Patient reports vaginal irritation.  Contractions: Not present. Vag. Bleeding: None.  Movement: Present. Denies leaking of fluid.   The following portions of the patient's history were reviewed and updated as appropriate: allergies, current medications, past family history, past medical history, past social history, past surgical history and problem list. Problem list updated.  Objective:   Vitals:   03/18/18 0933  BP: 137/69  Pulse: (!) 102  Weight: 171 lb (77.6 kg)    Fetal Status: Fetal Heart Rate (bpm): 157   Movement: Present     General:  Alert, oriented and cooperative. Patient is in no acute distress.  Skin: Skin is warm and dry. No rash noted.   Cardiovascular: Normal heart rate noted  Respiratory: Normal respiratory effort, no problems with respiration noted  Abdomen: Soft, gravid, appropriate for gestational age.  Pain/Pressure: Absent     Pelvic: Cervical exam deferred        Extremities: Normal range of motion.  Edema: None  Mental Status: Normal mood and affect. Normal behavior. Normal judgment and thought content.   Assessment and Plan:  Pregnancy: G1P0 at [redacted]w[redacted]d  1. Supervision of normal first pregnancy, antepartum - [place] MFM OB COMP + 14 WK; Future - AFP, Serum, Open Spina Bifida  2. Vaginal discharge during pregnancy in second trimester - Cervicovaginal ancillary only  Preterm labor symptoms and general obstetric precautions including but not limited to vaginal bleeding, contractions, leaking of fluid and fetal movement were reviewed in detail with the patient. Please refer to After Visit Summary for other counseling  recommendations.  Return in about 4 weeks (around 04/15/2018).  Future Appointments  Date Time Provider [place]  04/12/2018 10:45 AM [facility] [place] 2 [facility]-M[facility] M[facility]-US    Heather Hogan, CNM

## 2018-03-18 NOTE — Patient Instructions (Addendum)
AREA PEDIATRIC/FAMILY PRACTICE PHYSICIANS  [facility] [address], [address] [place], [place]  [address] Phone - [phone]   Fax - [phone]  [facility] [address] [place], [place] [address] Phone - [phone]   Fax - [phone]  [facility] AM[facility] [address] [place], [place]  [address] Phone - [phone]   Fax - [phone]  [facility] [address] [place], [place]  [address] Phone - [phone]   Fax - [phone]  [facility] [address] [place], [place]  [address] Phone - [phone]   Fax - [phone]  [facility] [address] [place], [place]  [address] Phone - [phone]   Fax - [phone]  [facility] [address] [place], [place]  [address] Phone - [phone]   Fax - [phone]  [facility] [address], [address] [place], [place]  [address] Phone - [phone]   Fax - [phone]  [facility] [address] [place], [place]  [address] Phone - [phone]   Fax - [phone] [facility] [address] [place], [place]  [address] Phone - [phone]   Fax - [phone]  [facility] [address] [place], [place]  [address] Phone - [phone]   Fax - [phone]  [facility] [address] [place], [place]  [address] Phone - [phone]   Fax - [phone]  [facility] [address], [address] [place], [place]  [address] Phone - [phone]   Fax - [phone]  SHILPA GOSRANI [address] [place], [place]  [address] Phone - [phone]  [facility] [address] [place], [place]  [address] Phone - [phone]   Fax - [phone]  [facility] [address] [place], [place]  [address] Phone - [phone]   Fax - [phone]  [facility] [address] [place], [place]  [address] Phone - [phone]   Fax - [phone]  [facility] [address] [place], [place]  [address] Phone - [phone]   Fax - [phone]   [facility] [address] [place], [place]  [address] Phone - [phone]   Fax - [phone]  [facility] [address] [place], [place]  [address] Phone - [phone]   Fax - [phone]  DAVID RUBIN [address] [place], [place]  [address] Phone - [phone]   Fax - [phone]  [facility] [address] [place], [place]  [address] Phone - [phone]   Fax - [phone]  [facility] [address] [place], [place]  [address] Phone - [phone]   Fax - [phone] [facility] [address] [place], [place]  [address] Phone - [phone]   Fax - [phone]  [facility] [address] [place], [place]  [address] Phone - [phone]   Fax - [phone]  [facility] [address] [place],   [address] Phone - [phone]   Fax - [phone]  [facility] Charlene Flemming MD [address]   [address] Phone [phone]  Fax [phone]  Childbirth Education Options: [place] Classes:  Childbirth education classes can help you   get ready for a positive parenting experience. You can also meet other expectant parents and get free stuff for your baby. Each class runs for five weeks on the same night and costs $45 for the mother-to-be and her support person. Medicaid covers the cost if you are eligible. Call [phone] to register. [facility] Childbirth Education:  [phone] or [phone] or  [email or web address]  Baby & Me Class: Discuss newborn & infant parenting and family adjustment issues with other new mothers in a relaxed environment. Each week brings a new speaker or baby-centered activity. We encourage new mothers to join us every Thursday at 11:00am. Babies birth until crawling. No registration or fee. Daddy Boot Camp: This course offers Dads-to-be the tools and knowledge needed to feel confident on their journey to becoming new fathers. Experienced dads, who have been trained as coaches, teach dads-to-be how to hold, comfort, diaper, swaddle and play with their infant while being able to support the new mom as well. A class for men taught by men. $25/dad Big Brother/Big Sister: Let your children share in the joy of a new brother or sister in this special class designed just for them. Class includes discussion about how families care for babies: swaddling, holding, diapering, safety as well as how they can be helpful in their new role. This class is designed for children ages 2 to 6, but any age is welcome. Please register each child individually. $5/child  Mom Talk: This mom-led group offers support and connection to mothers as they journey through the adjustments and struggles of that sometimes overwhelming first year after the birth of a child. Tuesdays at 10:00am and Thursdays at 6:00pm. Babies welcome. No registration or fee. Breastfeeding Support Group: This group is a mother-to-mother support circle where moms have the opportunity to share their breastfeeding experiences. A Lactation Consultant is present for questions and concerns. Meets each Tuesday at 11:00am. No fee or registration. Breastfeeding Your Baby: Learn what to expect in the first days of breastfeeding your newborn.  This class will help you feel more confident with the skills needed to begin your breastfeeding experience. Many new mothers are concerned about breastfeeding after leaving the hospital. This class  will also address the most common fears and challenges about breastfeeding during the first few weeks, months and beyond. (call for fee) Comfort Techniques and Tour: This 2 hour interactive class will provide you the opportunity to learn & practice hands-on techniques that can help relieve some of the discomfort of labor and encourage your baby to rotate toward the best position for birth. You and your partner will be able to try a variety of labor positions with birth balls and rebozos as well as practice breathing, relaxation, and visualization techniques. A tour of the [facility] is included with this class. $20 per registrant and support person Childbirth Class- Weekend Option: This class is a Weekend version of our Birth & Baby series. It is designed for parents who have a difficult time fitting several weeks of classes into their schedule. It covers the care of your newborn and the basics of labor and childbirth. It also includes a Maternity Care Center Tour of [facility] and lunch. The class is held two consecutive days: beginning on Friday evening from 6:30 - 8:30 p.m. and the next day, Saturday from 9 a.m. - 4 p.m. (call for fee) Waterbirth Class: Interested in a waterbirth?  This informational class will help you discover whether waterbirth is the right fit for you.   Education about waterbirth itself, supplies you would need and how to assemble your support team is what you can expect from this class. Some obstetrical practices require this class in order to pursue a waterbirth. (Not all obstetrical practices offer waterbirth-check with your healthcare provider.) Register only the expectant mom, but you are encouraged to bring your partner to class! Required if planning waterbirth, no fee. Infant/Child CPR: Parents, grandparents, babysitters, and friends learn Cardio-Pulmonary Resuscitation skills for infants and children. You will also learn how to treat both conscious  and unconscious choking in infants and children. This Family & Friends program does not offer certification. Register each participant individually to ensure that enough mannequins are available. (Call for fee) Grandparent Love: Expecting a grandbaby? This class is for you! Learn about the latest infant care and safety recommendations and ways to support your own child as he or she transitions into the parenting role. Taught by Registered Nurses who are childbirth instructors, but most importantly...they are grandmothers too! $10/person. Childbirth Class- Natural Childbirth: This series of 5 weekly classes is for expectant parents who want to learn and practice natural methods of coping with the process of labor and childbirth. Relaxation, breathing, massage, visualization, role of the partner, and helpful positioning are highlighted. Participants learn how to be confident in their body's ability to give birth. This class will empower and help parents make informed decisions about their own care. Includes discussion that will help new parents transition into the immediate postpartum period. [facility] is included. We suggest taking this class between 25-32 weeks, but it's only a recommendation. $75 per registrant and one support person or $30 Medicaid. Childbirth Class- 3 week Series: This option of 3 weekly classes helps you and your labor partner prepare for childbirth. Newborn care, labor & birth, cesarean birth, pain management, and comfort techniques are discussed and a [place] [facility] is included. The class meets at the same time, on the same day of the week for 3 consecutive weeks beginning with the starting date you choose. $60 for registrant and one support person.  Marvelous Multiples: Expecting twins, triplets, or more? This class covers the differences in labor, birth, parenting, and breastfeeding issues that face multiples' parents.  NICU tour is included. Led by a Certified Childbirth Educator who is the mother of twins. No fee. Caring for Baby: This class is for expectant and adoptive parents who want to learn and practice the most up-to-date newborn care for their babies. Focus is on birth through the first six weeks of life. Topics include feeding, bathing, diapering, crying, umbilical cord care, circumcision care and safe sleep. Parents learn to recognize symptoms of illness and when to call the pediatrician. Register only the mom-to-be and your partner or support person can plan to come with you! $10 per registrant and support person Childbirth Class- online option: This online class offers you the freedom to complete a Birth and Baby series in the comfort of your own home. The flexibility of this option allows you to review sections at your own pace, at times convenient to you and your support people. It includes additional video information, animations, quizzes, and extended activities. Get organized with helpful eClass tools, checklists, and trackers. Once you register online for the class, you will receive an email within a few days to accept the invitation and begin the class when the time is right for you. The content will be available to you for 60 days. $  60 for 60 days of online access for you and your support people.  Local Doulas: Natural Baby Doulas [email or web address] Tel: [phone] [email or web address] [facility] [phone] [email or web address] The Labor Ladies  (also do waterbirth tub rental) [phone] [email or web address] [facility] [phone] [email or web address] Sacred Rhythms  [phone] [email or web address] [place] [facility] [employer] (PADA) [email or web address] http://www.padanc.org/index.htm [place] [facility]   [email or web address] Considering Waterbirth? Guide for patients at [facility]  Why consider waterbirth?  . Gentle birth for babies . Less pain medicine used in labor . May allow for passive descent/less pushing . May reduce perineal tears  . More mobility and instinctive maternal position changes . Increased maternal relaxation . Reduced blood pressure in labor  Is waterbirth safe? What are the risks of infection, drowning or other complications?  . Infection: o Very low risk (3.7 % for tub vs 4.8% for bed) o 7 in 8000 waterbirths with documented infection o Poorly cleaned equipment most common cause o Slightly lower group B strep transmission rate  . Drowning o Maternal:  - Very low risk   - Related to seizures or fainting o Newborn:  - Very low risk. No evidence of increased risk of respiratory problems in multiple large studies - Physiological protection from breathing under water - Avoid underwater birth if there are any fetal complications - Once baby's head is out of the water, keep it out.  . Birth complication o Some reports of cord trauma, but risk decreased by bringing baby to surface gradually o No evidence of increased risk of shoulder dystocia. Mothers can usually change positions faster in water than in a bed, possibly aiding the maneuvers to free the shoulder.   You must attend a Waterbirth class at [facility]  3rd Wednesday of every month from 7-9pm  Free  Register by calling [phone] or online at [email or web address]  Bring us the certificate from the class to your prenatal appointment  Meet with a midwife at 36 weeks to see if you can still plan a waterbirth and to sign the consent.   Purchase or rent the following supplies:   Water Birth Pool (Birth Pool in a Box or [place] for instance)  (Tubs start ~$125)  Single-use disposable tub liner designed for your brand of tub  New garden hose labeled  "lead-free", "suitable for drinking water",  Electric drain pump to remove water (We recommend 792 gallon per hour or greater pump.)   Separate garden hose to remove the dirty water  Fish net  Bathing suit top (optional)  Long-handled mirror (optional)  Places to purchase or rent supplies  Yourwaterbirth.com for tub purchases and supplies  Waterbirthsolutions.com for tub purchases and supplies  The Labor Ladies (www.[email or web address]) $275 for tub rental/set-up & take down/kit   [facility] [employer] (http://www.padanc.org/MeetUs.htm) Information regarding doulas (labor support) who provide pool rentals  Our practice has a Birth Pool in a Box tub at the hospital that you may borrow on a first-come-first-served basis. It is your responsibility to to set up, clean and break down the tub. We cannot guarantee the availability of this tub in advance. You are responsible for bringing all accessories listed above. If you do not have all necessary supplies you cannot have a waterbirth.    Things that would prevent you from having a waterbirth:  Premature, <37wks  Previous cesarean birth  Presence of thick meconium-stained fluid  Multiple gestation (Twins, triplets, etc.)  Uncontrolled diabetes or gestational diabetes requiring medication  Hypertension requiring medication   or diagnosis of pre-eclampsia  Heavy vaginal bleeding  Non-reassuring fetal heart rate  Active infection (MRSA, etc.). Group B Strep is NOT a contraindication for  waterbirth.  If your labor has to be induced and induction method requires continuous  monitoring of the baby's heart rate  Other risks/issues identified by your obstetrical provider  Please remember that birth is unpredictable. Under certain unforeseeable circumstances your provider may advise against giving birth in the tub. These decisions will be made on a case-by-case basis and with the safety of you and your baby as our highest  priority.      

## 2018-03-19 LAB — CERVICOVAGINAL ANCILLARY ONLY
BACTERIAL VAGINITIS: POSITIVE — AB
CANDIDA VAGINITIS: POSITIVE — AB
Chlamydia: NEGATIVE
Neisseria Gonorrhea: NEGATIVE
TRICH (WINDOWPATH): NEGATIVE

## 2018-03-20 ENCOUNTER — Inpatient Hospital Stay (HOSPITAL_BASED_OUTPATIENT_CLINIC_OR_DEPARTMENT_OTHER): Payer: Medicaid Other

## 2018-03-20 ENCOUNTER — Encounter (HOSPITAL_COMMUNITY): Payer: Self-pay

## 2018-03-20 ENCOUNTER — Observation Stay (HOSPITAL_COMMUNITY)
Admission: AD | Admit: 2018-03-20 | Discharge: 2018-03-21 | Disposition: A | Discharge status: Home / Self Care | Payer: Medicaid Other | Source: Ambulatory Visit | Attending: Obstetrics & Gynecology | Admitting: Obstetrics & Gynecology

## 2018-03-20 ENCOUNTER — Other Ambulatory Visit: Payer: Self-pay

## 2018-03-20 DIAGNOSIS — D259 Leiomyoma of uterus, unspecified: Secondary | ICD-10-CM | POA: Insufficient documentation

## 2018-03-20 DIAGNOSIS — O034 Incomplete spontaneous abortion without complication: Principal | ICD-10-CM | POA: Insufficient documentation

## 2018-03-20 DIAGNOSIS — Z3400 Encounter for supervision of normal first pregnancy, unspecified trimester: Secondary | ICD-10-CM

## 2018-03-20 DIAGNOSIS — Z3A15 15 weeks gestation of pregnancy: Secondary | ICD-10-CM

## 2018-03-20 DIAGNOSIS — O039 Complete or unspecified spontaneous abortion without complication: Secondary | ICD-10-CM | POA: Diagnosis present

## 2018-03-20 DIAGNOSIS — O3432 Maternal care for cervical incompetence, second trimester: Secondary | ICD-10-CM

## 2018-03-20 DIAGNOSIS — O021 Missed abortion: Secondary | ICD-10-CM

## 2018-03-20 DIAGNOSIS — O209 Hemorrhage in early pregnancy, unspecified: Secondary | ICD-10-CM

## 2018-03-20 LAB — AFP, SERUM, OPEN SPINA BIFIDA
AFP MoM: 1.19
AFP VALUE AFPOSL: 35.6 ng/mL
GEST. AGE ON COLLECTION DATE: 15.3 wk
Maternal Age At EDD: 20.3 yr
OSBR Risk 1 IN: 10000
Test Results:: NEGATIVE
WEIGHT: 171 [lb_av]

## 2018-03-20 LAB — URINALYSIS, ROUTINE W REFLEX MICROSCOPIC
Bilirubin Urine: NEGATIVE
Glucose, UA: NEGATIVE mg/dL
Hgb urine dipstick: NEGATIVE
Ketones, ur: NEGATIVE mg/dL
NITRITE: NEGATIVE
PH: 6 (ref 5.0–8.0)
Protein, ur: NEGATIVE mg/dL
SPECIFIC GRAVITY, URINE: 1.009 (ref 1.005–1.030)

## 2018-03-20 LAB — CBC
HEMATOCRIT: 35.9 % — AB (ref 36.0–46.0)
HEMOGLOBIN: 12 g/dL (ref 12.0–15.0)
MCH: 31 pg (ref 26.0–34.0)
MCHC: 33.4 g/dL (ref 30.0–36.0)
MCV: 92.8 fL (ref 78.0–100.0)
PLATELETS: 188 10*3/uL (ref 150–400)
RBC: 3.87 MIL/uL (ref 3.87–5.11)
RDW: 14.2 % (ref 11.5–15.5)
WBC: 10.1 10*3/uL (ref 4.0–10.5)

## 2018-03-20 MED ORDER — CALCIUM CARBONATE ANTACID 500 MG PO CHEW: 2.0000 | CHEWABLE_TABLET | ORAL | Status: DC | PRN

## 2018-03-20 MED ORDER — DOCUSATE SODIUM 100 MG PO CAPS
100.0000 mg | ORAL_CAPSULE | Freq: Every day | ORAL | Status: DC
  Administered 2018-03-20: 100 mg via ORAL
  Filled 2018-03-20: qty 1

## 2018-03-20 MED ORDER — PRENATAL MULTIVITAMIN CH: 1.0000 | ORAL_TABLET | Freq: Every day | ORAL | Status: DC

## 2018-03-20 MED ORDER — FERROUS SULFATE 325 (65 FE) MG PO TABS
325.0000 mg | ORAL_TABLET | Freq: Two times a day (BID) | ORAL | Status: DC
  Administered 2018-03-20 – 2018-03-21 (×2): 325 mg via ORAL
  Filled 2018-03-20 (×2): qty 1

## 2018-03-20 MED ORDER — ACETAMINOPHEN 325 MG PO TABS
650.0000 mg | ORAL_TABLET | ORAL | Status: DC | PRN
  Administered 2018-03-20: 650 mg via ORAL
  Filled 2018-03-20: qty 2

## 2018-03-20 MED ORDER — ZOLPIDEM TARTRATE 5 MG PO TABS: 5.0000 mg | ORAL_TABLET | Freq: Every evening | ORAL | Status: DC | PRN

## 2018-03-20 MED ORDER — LACTATED RINGERS IV SOLN
INTRAVENOUS | Status: DC
  Administered 2018-03-21: 09:00:00 via INTRAVENOUS

## 2018-03-20 NOTE — H&P (Signed)
Sandra Macdonald is an 22 y.o. female. G1 at [redacted]w[redacted]d who presented with 4 hr history of crampy moderate lower abdominal pain, vaginal pressure and LBP. On SVE  membranes bulging in the vagina. Bedside US confirmed membranes completely in vagina and no CL. Placed in Trendelenburg. After 30 minutes had onset BRB.   Prenatal course significant for multiple small fibroids in uterus, G6PD deficiency, borderline anemia on iron supplementation. A pos. Hgb A1C 4.2. GC/CT neg/neg. WP positive for BV and candida on 03/18/18.  Family and partner present and supportive.  Pertinent Gynecological History: Previous GYN Procedures: none  Last pap: normal  OB History: G1P0       Past Medical History:  Diagnosis Date  . Depression   . Fibroid   . G6PD deficiency [facility])     Past Surgical History:  Procedure Laterality Date  . NO PAST SURGERIES      Family History  Problem Relation Age of Onset  . Mental illness Mother   . ADD / ADHD Neg Hx   . Anxiety disorder Neg Hx   . Alcohol abuse Neg Hx   . Arthritis Neg Hx   . Asthma Neg Hx   . Cancer Neg Hx   . Birth defects Neg Hx   . Depression Neg Hx   . COPD Neg Hx   . Diabetes Neg Hx   . Drug abuse Neg Hx   . Early death Neg Hx   . Hearing loss Neg Hx   . Heart disease Neg Hx   . Hyperlipidemia Neg Hx   . Hypertension Neg Hx   . Intellectual disability Neg Hx   . Kidney disease Neg Hx   . Learning disabilities Neg Hx   . Miscarriages / Stillbirths Neg Hx   . Stroke Neg Hx   . Obesity Neg Hx   . Vision loss Neg Hx   . Varicose Veins Neg Hx     Social History:  reports that she has never smoked. She has never used smokeless tobacco. She reports that she does not drink alcohol or use drugs.  Allergies:  Allergies  Allergen Reactions  . Blue Dyes (Parenteral) Other (See Comments)    G6PD-- Decreased hemoglobin  . Codeine Other (See Comments)    Decreased hemoglobin  . Other Other (See Comments)    G6PD--Decreased hemoglobin to potato chips  (not potatoes in other forms)    Medications Prior to Admission  Medication Sig Dispense Refill Last Dose  . acetaminophen (TYLENOL) 325 MG tablet Take 325 mg by mouth every 6 (six) hours as needed for moderate pain.   Taking  . cephALEXin (KEFLEX) 500 MG capsule Take 1 capsule (500 mg total) by mouth 4 (four) times daily. (Patient not taking: Reported on 03/18/2018) 40 capsule 0 Not Taking  . ferrous sulfate (FERROUSUL) 325 (65 FE) MG tablet Take 1 tablet (325 mg total) by mouth 2 (two) times daily. 60 tablet 1 Taking  . Prenatal Vit-Fe Fumarate-FA (PRENATAL MULTIVITAMIN) TABS tablet Take 1 tablet by mouth daily at 12 noon.   Taking    Review of Systems  Constitutional: Negative for chills, fever and malaise/fatigue.  Respiratory: Negative for shortness of breath.   Cardiovascular: Negative for palpitations.  Gastrointestinal: Positive for abdominal pain. Negative for diarrhea and nausea.  Genitourinary: Negative for dysuria, flank pain, frequency and urgency.  Musculoskeletal: Positive for back pain.       Constant LBP  Skin: Negative for rash.  Neurological: Negative for headaches.  Psychiatric/Behavioral: Negative  for depression. The patient is nervous/anxious.     Blood pressure 123/67, pulse 95, temperature 98.4 F (36.9 C), temperature source Oral, resp. rate 20, weight 78.2 kg, last menstrual period 11/30/2017, SpO2 100 %. Physical Exam  Nursing note and vitals reviewed. Constitutional: She is oriented to person, place, and time. She appears well-developed and well-nourished. No distress.  HENT:  Head: Normocephalic.  Eyes: No scleral icterus.  Neck: Neck supple. No thyromegaly present.  Cardiovascular: Normal rate and regular rhythm.  Respiratory: Effort normal and breath sounds normal.  GI: Soft. There is tenderness.  Fundus at u-2 DT FHR 147  Musculoskeletal: Normal range of motion.  Neurological: She is alert and oriented to person, place, and time.  Skin: Skin is  warm.  Psychiatric: She has a normal mood and affect.    Results for orders placed or performed during the hospital encounter of 03/20/18 (from the past 24 hour(s))  Urinalysis, Routine w reflex microscopic     Status: Abnormal   Collection Time: 03/20/18  4:25 PM  Result Value Ref Range   Color, Urine YELLOW YELLOW   APPearance CLEAR CLEAR   Specific Gravity, Urine 1.009 1.005 - 1.030   pH 6.0 5.0 - 8.0   Glucose, UA NEGATIVE NEGATIVE mg/dL   Hgb urine dipstick NEGATIVE NEGATIVE   Bilirubin Urine NEGATIVE NEGATIVE   Ketones, ur NEGATIVE NEGATIVE mg/dL   Protein, ur NEGATIVE NEGATIVE mg/dL   Nitrite NEGATIVE NEGATIVE   Leukocytes, UA SMALL (A) NEGATIVE   RBC / HPF 0-5 0 - 5 RBC/hpf   WBC, UA 0-5 0 - 5 WBC/hpf   Bacteria, UA RARE (A) NONE SEEN   Squamous Epithelial / LPF 0-5 0 - 5    [place] Mfm Ob Limited  Result Date: 03/20/2018 ----------------------------------------------------------------------  OBSTETRICS REPORT                      (Signed Final 03/20/2018 06:08 pm) ---------------------------------------------------------------------- Patient Info  ID #:       [id number]                          D.O.B.:  1996/06/03 (21 yrs)  Name:       Sandra Macdonald                       Visit Date: 03/20/2018 05:59 pm ---------------------------------------------------------------------- Performed By  Performed By:     Novella Rob        Ref. Address:     [address]  Attending:        Tama High MD        Location:         [facility]  Referred By:      Mallie Snooks POE                    CNM ---------------------------------------------------------------------- Orders   #  Description                                 Code   1  [place] MFM OB LIMITED                            [phone]  ----------------------------------------------------------------------   #  Ordered By               Order #        Accession #    Episode #   1  [place] POE              [id number]      [id number]     [id number]  ---------------------------------------------------------------------- Indications   Cervical incompetence, second trimester        O34.32   (CNM felt bulging membranes into vagina)   [redacted] weeks gestation of pregnancy                Z3A.15  ---------------------------------------------------------------------- OB History  Blood Type:            Height:  5'5"   Weight (lb):  172       BMI:  28.62  Gravidity:    1 ---------------------------------------------------------------------- Fetal Evaluation  Num Of Fetuses:     1  Fetal Heart         170  Rate(bpm):  Cardiac Activity:   Observed  Presentation:       Cephalic  Amniotic Fluid  AFI FV:      Anhydramnios ---------------------------------------------------------------------- Gestational Age  Best:          15w 5d     Det. By:  Previous Ultrasound      EDD:   09/06/18 ---------------------------------------------------------------------- Cervix Uterus Adnexa  Cervix  Hourglass membranes into the vagina. ---------------------------------------------------------------------- Impression  Patient was evaluated for abdominal pain.  A limited ultrasound study was performed. No measurable  pocket of amniotic fluid is seen around the fetus. A large  bulging membrane is seen in the vagina. Cervical canal is  completely dilated.  Impression: Inevitable miscarriage.  Patient is NOT a candidate for rescue cerclage. ----------------------------------------------------------------------                  Tama High, MD Electronically Signed Final Report   03/20/2018 06:08 pm ----------------------------------------------------------------------   Assessment/Plan: 22 yo G1 at [redacted]w[redacted]d Fibroids G6PD deficiency  Admit for expectant management SAB   Deirdre  Poe CNM 03/20/2018, 6:41 PM

## 2018-03-20 NOTE — MAU Note (Signed)
CNM at bedside, explained to pt reason for admission. Pt tearful but understood reasoning.

## 2018-03-20 NOTE — MAU Note (Signed)
Having really bad cramps in lower abd and stomach, pressure in vagina.  Started earlier today, stopped for awhile then started again. ? Leaking this morning? Water? pee

## 2018-03-20 NOTE — MAU Provider Note (Addendum)
History     CSN: [id number]  Arrival date and time: 03/20/18 1557   First Provider Initiated Contact with Patient 03/20/18 1720      Chief Complaint  Patient presents with  . Abdominal Pain  . Back Pain   Sandra Macdonald is a 22 y.o. G1P0 at [redacted]w[redacted]d presenting with lower abdominal and low back pain. Describes the pain as cramps worse than menstrual cramps. Pain waxes and wanes.  It radiates to low back and also associated with vaginal pressure.  No leaking or bleeding.  No previous similar episodes.  No antecedent intercourse.  She has had some vaginal irritation and had GC/CT and WP done in MAU 2 days ago when she came for headache evaluation. Results were positive for BV and candida, not treated.   History significant for myomatous uterus, G6PD deficiency; A pos   Past Medical History:  Diagnosis Date  . Depression   . Fibroid   . G6PD deficiency [facility])     Past Surgical History:  Procedure Laterality Date  . NO PAST SURGERIES      Family History  Problem Relation Age of Onset  . Mental illness Mother   . ADD / ADHD Neg Hx   . Anxiety disorder Neg Hx   . Alcohol abuse Neg Hx   . Arthritis Neg Hx   . Asthma Neg Hx   . Cancer Neg Hx   . Birth defects Neg Hx   . Depression Neg Hx   . COPD Neg Hx   . Diabetes Neg Hx   . Drug abuse Neg Hx   . Early death Neg Hx   . Hearing loss Neg Hx   . Heart disease Neg Hx   . Hyperlipidemia Neg Hx   . Hypertension Neg Hx   . Intellectual disability Neg Hx   . Kidney disease Neg Hx   . Learning disabilities Neg Hx   . Miscarriages / Stillbirths Neg Hx   . Stroke Neg Hx   . Obesity Neg Hx   . Vision loss Neg Hx   . Varicose Veins Neg Hx     Social History   Tobacco Use  . Smoking status: Never Smoker  . Smokeless tobacco: Never Used  Substance Use Topics  . Alcohol use: No    Frequency: Never  . Drug use: No    Allergies:  Allergies  Allergen Reactions  . Blue Dyes (Parenteral) Other (See Comments)    G6PD-- Decreased  hemoglobin  . Codeine Other (See Comments)    Decreased hemoglobin  . Other Other (See Comments)    G6PD--Decreased hemoglobin to potato chips (not potatoes in other forms)    Medications Prior to Admission  Medication Sig Dispense Refill Last Dose  . acetaminophen (TYLENOL) 325 MG tablet Take 325 mg by mouth every 6 (six) hours as needed for moderate pain.   Taking  . cephALEXin (KEFLEX) 500 MG capsule Take 1 capsule (500 mg total) by mouth 4 (four) times daily. (Patient not taking: Reported on 03/18/2018) 40 capsule 0 Not Taking  . ferrous sulfate (FERROUSUL) 325 (65 FE) MG tablet Take 1 tablet (325 mg total) by mouth 2 (two) times daily. 60 tablet 1 Taking  . Prenatal Vit-Fe Fumarate-FA (PRENATAL MULTIVITAMIN) TABS tablet Take 1 tablet by mouth daily at 12 noon.   Taking    Review of Systems  Constitutional: Negative for chills and fever.  Respiratory: Negative for chest tightness.   Gastrointestinal: Positive for abdominal pain.  Genitourinary: Positive for  pelvic pain and vaginal bleeding. Negative for dysuria, flank pain, hematuria and urgency.  Musculoskeletal: Positive for back pain.  Neurological: Negative for dizziness.  Psychiatric/Behavioral: The patient is not nervous/anxious.        Appropriately distressed and crying> then calm   Physical Exam   Blood pressure 122/83, pulse (!) 122, temperature 98.1 F (36.7 C), temperature source Oral, resp. rate 20, weight 78.2 kg, last menstrual period 11/30/2017, SpO2 99 %.  Physical Exam  Nursing note and vitals reviewed. Constitutional: She is oriented to person, place, and time. She appears well-developed and well-nourished. She appears distressed.  Appears uncomfortable  HENT:  Head: Normocephalic.  Cardiovascular: Normal rate.  Respiratory: Effort normal.  GI: There is tenderness.  Fundus at U-5fb, soft, mild-moderate diffuse tenderness  Genitourinary: No vaginal discharge found.  Genitourinary Comments: SVE: Bulging  membranes in vagina, no blood, cx not felt Neg CVAT  Neurological: She is alert and oriented to person, place, and time.  Skin: Skin is warm and dry.   DT FHR 157 MAU Course  Procedures Results for orders placed or performed during the hospital encounter of 03/20/18 (from the past 24 hour(s))  Urinalysis, Routine w reflex microscopic     Status: Abnormal   Collection Time: 03/20/18  4:25 PM  Result Value Ref Range   Color, Urine YELLOW YELLOW   APPearance CLEAR CLEAR   Specific Gravity, Urine 1.009 1.005 - 1.030   pH 6.0 5.0 - 8.0   Glucose, UA NEGATIVE NEGATIVE mg/dL   Hgb urine dipstick NEGATIVE NEGATIVE   Bilirubin Urine NEGATIVE NEGATIVE   Ketones, ur NEGATIVE NEGATIVE mg/dL   Protein, ur NEGATIVE NEGATIVE mg/dL   Nitrite NEGATIVE NEGATIVE   Leukocytes, UA SMALL (A) NEGATIVE   RBC / HPF 0-5 0 - 5 RBC/hpf   WBC, UA 0-5 0 - 5 WBC/hpf   Bacteria, UA RARE (A) NONE SEEN   Squamous Epithelial / LPF 0-5 0 - 5   Trendelenburg. Bedside Abd [place]: membranes completely in vagina, no CL Consulted Dr. Elonda Husky admit to 3rd floor, expectant management. Advised patient and family of impending SAB 1810: onset amount BRB Patient distressed and crying  Assessment and Plan  Teen G1 at [redacted]w[redacted]d   SAB imminent Fibroid uterus G6PD deficiency  [name] 03/20/2018 6:33 PM

## 2018-03-21 DIAGNOSIS — O021 Missed abortion: Secondary | ICD-10-CM

## 2018-03-21 MED ORDER — OXYTOCIN 40 UNITS IN LACTATED RINGERS INFUSION - SIMPLE MED
INTRAVENOUS | Status: AC
  Administered 2018-03-21: 15:00:00
  Filled 2018-03-21: qty 1000

## 2018-03-21 MED ORDER — MORPHINE SULFATE (PF) 4 MG/ML IV SOLN
2.0000 mg | INTRAVENOUS | Status: DC | PRN
  Administered 2018-03-21 (×3): 2 mg via INTRAVENOUS
  Filled 2018-03-21 (×3): qty 1

## 2018-03-21 MED ORDER — MISOPROSTOL 200 MCG PO TABS
800.0000 ug | ORAL_TABLET | Freq: Once | ORAL | Status: AC
  Administered 2018-03-21: 800 ug via VAGINAL
  Filled 2018-03-21: qty 4

## 2018-03-21 MED ORDER — OXYTOCIN 40 UNITS IN LACTATED RINGERS INFUSION - SIMPLE MED
INTRAVENOUS | Status: AC
  Administered 2018-03-21: 11:00:00
  Filled 2018-03-21: qty 1000

## 2018-03-21 MED ORDER — MISOPROSTOL 200 MCG PO TABS
400.0000 ug | ORAL_TABLET | ORAL | Status: DC
  Administered 2018-03-21: 400 ug via VAGINAL
  Filled 2018-03-21: qty 2

## 2018-03-21 MED ORDER — IBUPROFEN 800 MG PO TABS: 800.0000 mg | ORAL_TABLET | Freq: Three times a day (TID) | ORAL | 0 refills | Status: DC | PRN

## 2018-03-21 MED ORDER — OXYTOCIN 40 UNITS IN LACTATED RINGERS INFUSION - SIMPLE MED: 1.0000 m[IU]/min | INTRAVENOUS | Status: DC

## 2018-03-21 NOTE — Progress Notes (Signed)
In to see patient, she delivered fetus attended by RN. RN clamped cord and took fetus away. Placenta remains, exam showed placenta partially in vagina. No active bleeding. Will give another dose cytotec. Reviewed with patient that with no delivery of placenta, will need to proceed with D&C. She is agreeable to plan, for more IV pain meds now.    Feliz Beam, M.D. [facility]

## 2018-03-21 NOTE — Progress Notes (Signed)
Subjective: [redacted]w[redacted]d with incomplete AB   Patient reports minimal cramping and no bleeding .no passage of tissue    Objective: I have reviewed patient's vital signs and medications. Abdomen soft non tender    Assessment/Plan: [redacted]w[redacted]d with inevitable AB, presenting with several hours of contractions and some bleeding before presentation  Experienced SROM at [place] or so  Was offered cytotec last night but declined, now wants to proceed with cytotec to complete loss   LOS: 1 day    Florian Buff 03/21/2018, 7:16 AM

## 2018-03-21 NOTE — Progress Notes (Signed)
Emergency light went off at 1035, pt stating she had delivered baby. RN entered room and baby was laying in bed. Time of delivery 1035. Waiting for placenta to deliver.

## 2018-03-21 NOTE — Progress Notes (Signed)
In to see patient, she is cramping uncomfortably, requesting pain meds. IV pain meds ordered. Reviewed course and expectations, she verbalizes understanding, answered all questions.   Feliz Beam, M.D. [facility]

## 2018-03-21 NOTE — Discharge Instructions (Signed)
Dilation and Curettage or Vacuum Curettage, Care After  These instructions give you information about caring for yourself after your procedure. Your doctor may also give you more specific instructions. Call your doctor if you have any problems or questions after your procedure.  Follow these instructions at home:  Activity   · Do not drive or use heavy machinery while taking prescription pain medicine.  · For 24 hours after your procedure, avoid driving.  · Take short walks often, followed by rest periods. Ask your doctor what activities are safe for you. After one or two days, you may be able to return to your normal activities.  · Do not lift anything that is heavier than 10 lb (4.5 kg) until your doctor approves.  · For at least 2 weeks, or as long as told by your doctor:  ? Do not douche.  ? Do not use tampons.  ? Do not have sex.  General instructions   · Take over-the-counter and prescription medicines only as told by your doctor. This is very important if you take blood thinning medicine.  · Do not take baths, swim, or use a hot tub until your doctor approves. Take showers instead of baths.  · Wear compression stockings as told by your doctor.  · It is up to you to get the results of your procedure. Ask your doctor when your results will be ready.  · Keep all follow-up visits as told by your doctor. This is important.  Contact a doctor if:  · You have very bad cramps that get worse or do not get better with medicine.  · You have very bad pain in your belly (abdomen).  · You cannot drink fluids without throwing up (vomiting).  · You get pain in a different part of the area between your belly and thighs (pelvis).  · You have bad-smelling discharge from your vagina.  · You have a rash.  Get help right away if:  · You are bleeding a lot from your vagina. A lot of bleeding means soaking more than one sanitary pad in an hour, for 2 hours in a row.  · You have clumps of blood (blood clots) coming from your  vagina.  · You have a fever or chills.  · Your belly feels very tender or hard.  · You have chest pain.  · You have trouble breathing.  · You cough up blood.  · You feel dizzy.  · You feel light-headed.  · You pass out (faint).  · You have pain in your neck or shoulder area.  Summary  · Take short walks often, followed by rest periods. Ask your doctor what activities are safe for you. After one or two days, you may be able to return to your normal activities.  · Do not lift anything that is heavier than 10 lb (4.5 kg) until your doctor approves.  · Do not take baths, swim, or use a hot tub until your doctor approves. Take showers instead of baths.  · Contact your doctor if you have any symptoms of infection, like bad-smelling discharge from your vagina.  This information is not intended to replace advice given to you by your health care provider. Make sure you discuss any questions you have with your health care provider.  Document Released: 05/02/2008 Document Revised: 04/10/2016 Document Reviewed: 04/10/2016  Elsevier Interactive Patient Education © 2017 Elsevier Inc.

## 2018-03-21 NOTE — Progress Notes (Signed)
I spent time with Sandra Macdonald and her family today as they processed their feelings and began to come to terms with the early delivery and loss of their baby.  Sandra Macdonald is well supported by her partner and mother.  She has found some relief to her physical pain now that her placenta is delivered.  I informed them of local resources for continued support and she is aware of how to reach spiritual care services.  Please page as further needs arise.  Donald Prose. Elyn Peers, M.Div. [facility] Chaplain Pager [phone] Office [phone]

## 2018-03-21 NOTE — Progress Notes (Signed)
In to see patient, she has not felt any further gush. Is having cramping. On exam, placenta palpated mostly in vagina, was able to gently remove placenta with gentle traction. Placenta appeared intact and appropriate size for gestational age. No active bleeding noted. Exam of lower uterine segment showed no remaining tissue. Pitocin continued.   Regular diet Will cont to watch for now Cont pit  Possible discharge home later  Patient desires cytogenetics, sample taken and sent for exam Patient desires hospital to dispose of remains.  SW consult  Feliz Beam, M.D. [facility]

## 2018-03-21 NOTE — Progress Notes (Signed)
Placenta delivered at 1458 with MD in room

## 2018-03-22 NOTE — Discharge Summary (Signed)
Physician Discharge Summary  Patient ID: Sandra Macdonald MRN: [id number] DOB/AGE: Jan 02, 1996 22 y.o.  Admit date: 03/20/2018 Discharge date: 03/22/2018  Admission Diagnoses: IUFD  Discharge Diagnoses:  Active Problems:   Vaginal bleeding before [redacted] weeks gestation   SAB (spontaneous abortion)   Discharged Condition: good  Hospital Course: Patient had an uncomplicated vaginal and D&C for retained placenta; for further details of this surgery, please refer to the operative note. Furthermore, the patient had an uncomplicated postoperative course.  By time of discharge, her pain was controlled on oral pain medications; she was ambulating, voiding without difficulty, tolerating regular diet and passing flatus.  She was deemed stable for discharge to home.    Consults: None  Significant Diagnostic Studies: labs: CBC  Treatments: surgery: vaginal delivery of IUFD and D&E for retained placenta   Discharge Exam: Blood pressure 117/68, pulse 79, temperature 98.8 F (37.1 C), temperature source Oral, resp. rate 16, height 5' 5.51" (1.664 m), weight 78.2 kg, last menstrual period 11/30/2017, SpO2 100 %.   Disposition: Discharge disposition: 01-Home or Self Care       Discharge Instructions    Call MD for:  difficulty breathing, headache or visual disturbances   Complete by:  As directed    Call MD for:  extreme fatigue   Complete by:  As directed    Call MD for:  hives   Complete by:  As directed    Call MD for:  persistant dizziness or light-headedness   Complete by:  As directed    Call MD for:  persistant nausea and vomiting   Complete by:  As directed    Call MD for:  redness, tenderness, or signs of infection (pain, swelling, redness, odor or green/yellow discharge around incision site)   Complete by:  As directed    Call MD for:  severe uncontrolled pain   Complete by:  As directed    Call MD for:  temperature >100.4   Complete by:  As directed    Diet general   Complete by:   As directed    Driving Restrictions   Complete by:  As directed    No driving for 24 hours   Increase activity slowly   Complete by:  As directed    Sexual Activity Restrictions   Complete by:  As directed    Nothing in vagina for 4 weeks     Allergies as of 03/21/2018      Reactions   Blue Dyes (parenteral) Other (See Comments)   G6PD-- Decreased hemoglobin   Codeine Other (See Comments)   Decreased hemoglobin   Other Other (See Comments)   G6PD--Decreased hemoglobin to potato chips (not potatoes in other forms)      Medication List    TAKE these medications   ferrous sulfate 325 (65 FE) MG tablet Take 1 tablet (325 mg total) by mouth 2 (two) times daily.   ibuprofen 800 MG tablet Commonly known as:  ADVIL,MOTRIN Take 1 tablet (800 mg total) by mouth every 8 (eight) hours as needed.   prenatal multivitamin Tabs tablet Take 1 tablet by mouth daily at 12 noon.      Follow-up Information    [facility] Follow up in 2 week(s).   Contact information: [address] [place] [phone]          Signed: Lavonia Drafts 03/22/2018, 7:33 AM

## 2018-03-25 ENCOUNTER — Other Ambulatory Visit: Payer: Self-pay

## 2018-03-25 MED ORDER — TERCONAZOLE 0.4 % VA CREA: 1.0000 | TOPICAL_CREAM | Freq: Every day | VAGINAL | 0 refills | Status: DC

## 2018-03-25 MED ORDER — METRONIDAZOLE 500 MG PO TABS: 500.0000 mg | ORAL_TABLET | Freq: Two times a day (BID) | ORAL | 0 refills | Status: DC

## 2018-03-25 NOTE — Progress Notes (Signed)
Treatment e-prescribed per standing order for BV and yeast infection.  Pt notified.

## 2018-04-05 ENCOUNTER — Encounter (HOSPITAL_COMMUNITY): Payer: Self-pay

## 2018-04-05 LAB — CHROMOSOME STD, POC(TISSUE)-NCBH

## 2018-04-10 ENCOUNTER — Ambulatory Visit (INDEPENDENT_AMBULATORY_CARE_PROVIDER_SITE_OTHER): Payer: Self-pay

## 2018-04-10 VITALS — BP 115/70 | HR 85 | Ht 65.0 in | Wt 173.2 lb

## 2018-04-10 DIAGNOSIS — R4589 Other symptoms and signs involving emotional state: Secondary | ICD-10-CM

## 2018-04-10 DIAGNOSIS — Z09 Encounter for follow-up examination after completed treatment for conditions other than malignant neoplasm: Secondary | ICD-10-CM

## 2018-04-10 DIAGNOSIS — O039 Complete or unspecified spontaneous abortion without complication: Secondary | ICD-10-CM

## 2018-04-10 NOTE — Progress Notes (Signed)
History:  Sandra Macdonald is a 22 y.o. G1P0 who presents to clinic today for follow up after a 15 week SAB. She denies any pain or bleeding. She is tearful but reports good support at home. She does not want to see [name] today. She desires pregnancy.    The following portions of the patient's history were reviewed and updated as appropriate: allergies, current medications, family history, past medical history, social history, past surgical history and problem list.  Review of Systems:  Review of Systems  Constitutional: Negative.  Negative for chills and fever.  Respiratory: Negative.   Cardiovascular: Negative.  Negative for chest pain.  Genitourinary: Negative.   Neurological: Negative.  Negative for dizziness and headaches.      Objective:  Physical Exam BP 115/70   Pulse 85   Ht 5\' 5"  (1.651 m)   Wt 173 lb 3.2 oz (78.6 kg)   LMP 11/30/2017   Breastfeeding? No   BMI 28.82 kg/m  Physical Exam  Constitutional: She is oriented to person, place, and time. She appears well-developed and well-nourished. No distress.  HENT:  Head: Normocephalic.  Eyes: Pupils are equal, round, and reactive to light.  Neck: Normal range of motion.  Cardiovascular: Normal rate and regular rhythm.  Pulmonary/Chest: Effort normal and breath sounds normal. No respiratory distress.  Abdominal: Soft. There is no tenderness.  Musculoskeletal: Normal range of motion.  Neurological: She is alert and oriented to person, place, and time.  Skin: Skin is warm and dry.  Psychiatric: She has a normal mood and affect. Her behavior is normal. Judgment and thought content normal.  Nursing note and vitals reviewed.  Assessment & Plan:  1. Miscarriage - Discussed with patient timing of next pregnancy. Lengthy discussion of future management of pregnancies due to this loss. -Patient does not desires contraception at this time -Resources for pregnancy loss reviewed with patient.    Sandra Macdonald,  [place] 04/10/2018 11:22 AM

## 2018-04-12 ENCOUNTER — Ambulatory Visit (HOSPITAL_COMMUNITY): Payer: Medicaid Other | Attending: Advanced Practice Midwife

## 2018-04-12 ENCOUNTER — Telehealth: Payer: Self-pay

## 2018-04-12 NOTE — Telephone Encounter (Signed)
Pt called on the Nurse Line today at 10:15a stating that she dropped her paperwork off to be filled out so she can go back to work. Pt request a call back at [phone].

## 2018-04-12 NOTE — Telephone Encounter (Signed)
Called patient and discussed we are currently working on completing her paperwork & ask for 7-10 days for completion. Discussed someone from our office will contact her when the paperwork has been completed/faxed. Patient verbalized understanding and had no questions.

## 2018-04-15 ENCOUNTER — Encounter: Payer: Self-pay | Admitting: Advanced Practice Midwife

## 2018-04-18 ENCOUNTER — Telehealth: Payer: Self-pay | Admitting: General Practice

## 2018-04-18 NOTE — Telephone Encounter (Signed)
Patient called and left message on nurse voicemail line stating she wants to know if her FMLA papers have been completed yet.

## 2018-04-21 DIAGNOSIS — Z029 Encounter for administrative examinations, unspecified: Secondary | ICD-10-CM

## 2018-04-24 ENCOUNTER — Telehealth: Payer: Self-pay | Admitting: *Deleted

## 2018-04-24 NOTE — Telephone Encounter (Signed)
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

## 2018-04-25 NOTE — Telephone Encounter (Signed)
FMLA forms updated yesterday with corrected leave information. Forms were faxed to [facility] and pt was notified she may return to work on 04/25/18.

## 2018-05-06 ENCOUNTER — Encounter (HOSPITAL_COMMUNITY): Payer: Self-pay | Admitting: Emergency Medicine

## 2018-05-06 ENCOUNTER — Emergency Department (HOSPITAL_COMMUNITY)
Admission: EM | Admit: 2018-05-06 | Discharge: 2018-05-06 | Disposition: A | Discharge status: Home / Self Care | Payer: Medicaid Other | Attending: Emergency Medicine | Admitting: Emergency Medicine

## 2018-05-06 ENCOUNTER — Emergency Department (HOSPITAL_COMMUNITY): Payer: Medicaid Other

## 2018-05-06 DIAGNOSIS — M25572 Pain in left ankle and joints of left foot: Secondary | ICD-10-CM | POA: Insufficient documentation

## 2018-05-06 DIAGNOSIS — F329 Major depressive disorder, single episode, unspecified: Secondary | ICD-10-CM | POA: Insufficient documentation

## 2018-05-06 NOTE — ED Notes (Signed)
Ace wrap applied to L ankle

## 2018-05-06 NOTE — ED Provider Notes (Signed)
[facility] DEPT Provider Note   CSN: [id number] Arrival date & time: 05/06/18  1352     History   Chief Complaint Chief Complaint  Patient presents with  . Ankle Pain    HPI Allan Bacigalupi is a 22 y.o. female.  The history is provided by the patient. No language interpreter was used.  Ankle Pain   The incident occurred yesterday. The incident occurred at work. There was no injury mechanism. The pain is present in the left heel and left ankle. The quality of the pain is described as aching. The pain is moderate. The pain has been constant since onset. Pertinent negatives include no loss of motion. She reports no foreign bodies present. Nothing aggravates the symptoms. She has tried nothing for the symptoms. The treatment provided no relief.   Pt complains of soreness ankle and foot.  Pt denies any injuries.  Pt reports she stands a lot at her job.  Past Medical History:  Diagnosis Date  . Depression   . Fibroid   . G6PD deficiency [facility])     Patient Active Problem List   Diagnosis Date Noted  . Vaginal bleeding before [redacted] weeks gestation 03/20/2018  . SAB (spontaneous abortion) 03/20/2018  . Supervision of normal first pregnancy, antepartum 02/18/2018  . Depression 02/18/2018  . G6PD deficiency anemia ([place]) 01/14/2018  . Fibroids 12/30/2017    Past Surgical History:  Procedure Laterality Date  . NO PAST SURGERIES       OB History    Gravida  1   Para      Term      Preterm      AB      Living        SAB      TAB      Ectopic      Multiple      Live Births               Home Medications    Prior to Admission medications   Medication Sig Start Date End Date Taking? Authorizing Provider  ferrous sulfate (FERROUSUL) 325 (65 FE) MG tablet Take 1 tablet (325 mg total) by mouth 2 (two) times daily. Patient not taking: Reported on 03/21/2018 01/14/18   Leftwich-Kirby, Kathie Dike, CNM  ibuprofen (ADVIL,MOTRIN) 800 MG tablet  Take 1 tablet (800 mg total) by mouth every 8 (eight) hours as needed. 03/21/18   Lavonia Drafts, MD  metroNIDAZOLE (FLAGYL) 500 MG tablet Take 1 tablet (500 mg total) by mouth 2 (two) times daily. Patient not taking: Reported on 04/10/2018 03/25/18   Donnamae Jude, MD  Prenatal Vit-Fe Fumarate-FA (PRENATAL MULTIVITAMIN) TABS tablet Take 1 tablet by mouth daily at 12 noon.    [provider]    Family History Family History  Problem Relation Age of Onset  . Mental illness Mother   . ADD / ADHD Neg Hx   . Anxiety disorder Neg Hx   . Alcohol abuse Neg Hx   . Arthritis Neg Hx   . Asthma Neg Hx   . Cancer Neg Hx   . Birth defects Neg Hx   . Depression Neg Hx   . COPD Neg Hx   . Diabetes Neg Hx   . Drug abuse Neg Hx   . Early death Neg Hx   . Hearing loss Neg Hx   . Heart disease Neg Hx   . Hyperlipidemia Neg Hx   . Hypertension Neg Hx   .  Intellectual disability Neg Hx   . Kidney disease Neg Hx   . Learning disabilities Neg Hx   . Miscarriages / Stillbirths Neg Hx   . Stroke Neg Hx   . Obesity Neg Hx   . Vision loss Neg Hx   . Varicose Veins Neg Hx     Social History Social History   Tobacco Use  . Smoking status: Never Smoker  . Smokeless tobacco: Never Used  Substance Use Topics  . Alcohol use: No    Frequency: Never  . Drug use: No     Allergies   Blue dyes (parenteral); Codeine; and Other   Review of Systems Review of Systems  Musculoskeletal: Positive for myalgias.  All other systems reviewed and are negative.    Physical Exam Updated Vital Signs BP 127/83 (BP Location: Right Arm)   Pulse 96   Temp 98.6 F (37 C) (Oral)   Resp 17   LMP 04/20/2018   SpO2 100%   Physical Exam  Constitutional: She appears well-developed and well-nourished.  HENT:  Head: Normocephalic.  Musculoskeletal: She exhibits tenderness.  Tender left ankle and foot,  No evidence of injury  Neurological: She is alert.  Skin: Skin is warm.  Psychiatric:  She has a normal mood and affect.  Nursing note and vitals reviewed.    ED Treatments / Results  Labs (all labs ordered are listed, but only abnormal results are displayed) Labs Reviewed - No data to display  EKG None  Radiology Dg Ankle Complete Left  Result Date: 05/06/2018 CLINICAL DATA:  Left ankle pain for 2 days without injury EXAM: LEFT ANKLE COMPLETE - 3+ VIEW COMPARISON:  None FINDINGS: Osseous mineralization normal. Joint spaces preserved. No fracture, dislocation, or bone destruction. IMPRESSION: Normal exam. Electronically Signed   By: Lavonia Dana M.D.   On: 05/06/2018 14:36    Procedures Procedures (including critical care time)  Medications Ordered in ED Medications - No data to display   Initial Impression / Assessment and Plan / ED Course  I have reviewed the triage vital signs and the nursing notes.  Pertinent labs & imaging results that were available during my care of the patient were reviewed by me and considered in my medical decision making (see chart for details).     MDM   Pt counseled on foot pain and possible plantar fascitis.   Pt advised ace wrap. Rest.  Ibuprofen An After Visit Summary was printed and given to the patient.   Final Clinical Impressions(s) / ED Diagnoses   Final diagnoses:  Acute left ankle pain    ED Discharge Orders    None       Fransico Meadow, [place] 05/07/18 1336    Tegeler, Gwenyth Allegra, MD 05/07/18 [phone]

## 2018-05-06 NOTE — ED Notes (Signed)
Ortho tech contacted for crutches

## 2018-05-06 NOTE — ED Triage Notes (Signed)
Pt reports that she started having left ankle pain yesterday. Denies any falls or injuries.

## 2018-08-19 ENCOUNTER — Ambulatory Visit (INDEPENDENT_AMBULATORY_CARE_PROVIDER_SITE_OTHER): Payer: Self-pay

## 2018-08-19 ENCOUNTER — Encounter: Payer: Self-pay | Admitting: Family Medicine

## 2018-08-19 DIAGNOSIS — Z3201 Encounter for pregnancy test, result positive: Secondary | ICD-10-CM

## 2018-08-19 NOTE — Progress Notes (Signed)
PT here for UPT-Positive, pt states took 5 test at home all were positive. Reviewed meds, pt has started Prenatal vitamins, advised take Tylenol only for pain during pregnancy. Pt verbalized understanding. LMP- 08/01/18, GA-[redacted]w[redacted]d, EDD-05/08/19.

## 2018-08-19 NOTE — Progress Notes (Signed)
I have reviewed the chart and agree with nursing staff's documentation of this patient's encounter.  Marcille Buffy DNP, CNM  08/19/18  4:50 PM

## 2018-08-20 LAB — POCT PREGNANCY, URINE: PREG TEST UR: POSITIVE — AB

## 2018-08-26 ENCOUNTER — Inpatient Hospital Stay (HOSPITAL_COMMUNITY)
Admission: AD | Admit: 2018-08-26 | Discharge: 2018-08-26 | Disposition: A | Discharge status: Home / Self Care | Payer: Self-pay | Attending: Obstetrics and Gynecology | Admitting: Obstetrics and Gynecology

## 2018-08-26 ENCOUNTER — Encounter (HOSPITAL_COMMUNITY): Payer: Self-pay | Admitting: *Deleted

## 2018-08-26 ENCOUNTER — Inpatient Hospital Stay (HOSPITAL_COMMUNITY): Payer: Self-pay

## 2018-08-26 ENCOUNTER — Other Ambulatory Visit: Payer: Self-pay

## 2018-08-26 DIAGNOSIS — Z3490 Encounter for supervision of normal pregnancy, unspecified, unspecified trimester: Secondary | ICD-10-CM

## 2018-08-26 DIAGNOSIS — O26891 Other specified pregnancy related conditions, first trimester: Secondary | ICD-10-CM

## 2018-08-26 DIAGNOSIS — Z3A08 8 weeks gestation of pregnancy: Secondary | ICD-10-CM

## 2018-08-26 DIAGNOSIS — R109 Unspecified abdominal pain: Secondary | ICD-10-CM | POA: Insufficient documentation

## 2018-08-26 DIAGNOSIS — O209 Hemorrhage in early pregnancy, unspecified: Secondary | ICD-10-CM | POA: Insufficient documentation

## 2018-08-26 DIAGNOSIS — Z3A01 Less than 8 weeks gestation of pregnancy: Secondary | ICD-10-CM | POA: Insufficient documentation

## 2018-08-26 LAB — URINALYSIS, ROUTINE W REFLEX MICROSCOPIC
Bilirubin Urine: NEGATIVE
GLUCOSE, UA: NEGATIVE mg/dL
HGB URINE DIPSTICK: NEGATIVE
Ketones, ur: NEGATIVE mg/dL
Leukocytes, UA: NEGATIVE
Nitrite: NEGATIVE
Protein, ur: NEGATIVE mg/dL
Specific Gravity, Urine: 1.019 (ref 1.005–1.030)
pH: 8 (ref 5.0–8.0)

## 2018-08-26 LAB — WET PREP, GENITAL
Clue Cells Wet Prep HPF POC: NONE SEEN
Sperm: NONE SEEN
Trich, Wet Prep: NONE SEEN
Yeast Wet Prep HPF POC: NONE SEEN

## 2018-08-26 LAB — HCG, QUANTITATIVE, PREGNANCY: hCG, Beta Chain, Quant, S: 161800 m[IU]/mL — ABNORMAL HIGH (ref <5)

## 2018-08-26 MED ORDER — ACETAMINOPHEN 500 MG PO TABS
1000.0000 mg | ORAL_TABLET | Freq: Once | ORAL | Status: AC
  Administered 2018-08-26: 1000 mg via ORAL
  Filled 2018-08-26: qty 2

## 2018-08-26 NOTE — Discharge Instructions (Signed)

## 2018-08-26 NOTE — MAU Note (Signed)
Been having dark bleeding since Jan 4th, started cramping after that.

## 2018-08-26 NOTE — MAU Provider Note (Signed)
History     CSN: [id number]  Arrival date and time: 08/26/18 1443   First Provider Initiated Contact with Patient 08/26/18 1905      Chief Complaint  Patient presents with  . Vaginal Bleeding  . Abdominal Pain   HPI Sandra Macdonald is a 23 y.o. G2P0010 at [redacted]w[redacted]d who presents to MAU with chief complaint of abdominal cramping and vaginal bleeding. These are new problems which began 08/09/2018. She endorses bilateral lower abdominal pain 4/10 which waxes and wanes, does not radiate, and does not have aggravating or alleviating factors. She also endorses daily dark red vaginal bleeding.  Patient also endorses pain while voiding. She denies abnormal vaginal discharge, pain during intercourse, abdominal tenderness, fever or recent illness.  OB History    Gravida  2   Para      Term      Preterm      AB  1   Living        SAB  1   TAB      Ectopic      Multiple      Live Births              Past Medical History:  Diagnosis Date  . Depression   . Fibroid   . G6PD deficiency     Past Surgical History:  Procedure Laterality Date  . NO PAST SURGERIES      Family History  Problem Relation Age of Onset  . Mental illness Mother   . ADD / ADHD Neg Hx   . Anxiety disorder Neg Hx   . Alcohol abuse Neg Hx   . Arthritis Neg Hx   . Asthma Neg Hx   . Cancer Neg Hx   . Birth defects Neg Hx   . Depression Neg Hx   . COPD Neg Hx   . Diabetes Neg Hx   . Drug abuse Neg Hx   . Early death Neg Hx   . Hearing loss Neg Hx   . Heart disease Neg Hx   . Hyperlipidemia Neg Hx   . Hypertension Neg Hx   . Intellectual disability Neg Hx   . Kidney disease Neg Hx   . Learning disabilities Neg Hx   . Miscarriages / Stillbirths Neg Hx   . Stroke Neg Hx   . Obesity Neg Hx   . Vision loss Neg Hx   . Varicose Veins Neg Hx     Social History   Tobacco Use  . Smoking status: Never Smoker  . Smokeless tobacco: Never Used  Substance Use Topics  . Alcohol use: No    Frequency:  Never  . Drug use: No    Allergies:  Allergies  Allergen Reactions  . Blue Dyes (Parenteral) Other (See Comments)    G6PD-- Decreased hemoglobin  . Codeine Other (See Comments)    Decreased hemoglobin  . Other Other (See Comments)    G6PD--Decreased hemoglobin to potato chips (not potatoes in other forms)    Medications Prior to Admission  Medication Sig Dispense Refill Last Dose  . Prenatal Vit-Fe Fumarate-FA (PRENATAL MULTIVITAMIN) TABS tablet Take 1 tablet by mouth daily at 12 noon.   08/26/2018 at Unknown time  . ferrous sulfate (FERROUSUL) 325 (65 FE) MG tablet Take 1 tablet (325 mg total) by mouth 2 (two) times daily. (Patient not taking: Reported on 03/21/2018) 60 tablet 1 Not Taking  . ibuprofen (ADVIL,MOTRIN) 800 MG tablet Take 1 tablet (800 mg total) by mouth every  8 (eight) hours as needed. (Patient not taking: Reported on 08/19/2018) 30 tablet 0 Not Taking  . metroNIDAZOLE (FLAGYL) 500 MG tablet Take 1 tablet (500 mg total) by mouth 2 (two) times daily. (Patient not taking: Reported on 04/10/2018) 14 tablet 0 Not Taking    Review of Systems  Constitutional: Negative for chills, fatigue and fever.  Gastrointestinal: Positive for abdominal pain. Negative for diarrhea, nausea and vomiting.  Genitourinary: Positive for dysuria and vaginal bleeding. Negative for difficulty urinating, dyspareunia, flank pain, frequency, pelvic pain, vaginal discharge and vaginal pain.  Musculoskeletal: Negative for back pain.  Neurological: Negative for headaches.  All other systems reviewed and are negative.  Physical Exam   Blood pressure 128/76, pulse (!) 106, temperature 98.3 F (36.8 C), temperature source Oral, resp. rate 17, height 5\' 5"  (1.651 m), weight 90.7 kg, last menstrual period 07/03/2018, SpO2 99 %.  Physical Exam  Nursing note and vitals reviewed. Constitutional: She is oriented to person, place, and time. She appears well-developed and well-nourished.  Cardiovascular: Normal  rate.  Respiratory: Effort normal. No respiratory distress.  GI: Soft. Bowel sounds are normal. She exhibits no distension. There is no abdominal tenderness. There is no rebound and no CVA tenderness.  Genitourinary:    Uterus normal.     Vaginal discharge present.     Genitourinary Comments: Thick yellow discharge proximal to cervical os   Neurological: She is alert and oriented to person, place, and time.  Skin: Skin is warm and dry.  Psychiatric: She has a normal mood and affect. Her behavior is normal. Judgment and thought content normal.    MAU Course/MDM  Procedures: sterile speculum exam   --No concerning findings on physical exam or lab results  Patient Vitals for the past 24 hrs:  BP Temp Temp src Pulse Resp SpO2 Height Weight  08/26/18 2053 109/74 - - 91 - - - -  08/26/18 1455 128/76 98.3 F (36.8 C) Oral (!) 106 17 99 % 5\' 5"  (1.651 m) 90.7 kg    Results for orders placed or performed during the hospital encounter of 08/26/18 (from the past 24 hour(s))  Urinalysis, Routine w reflex microscopic     Status: None   Collection Time: 08/26/18  3:17 PM  Result Value Ref Range   Color, Urine YELLOW YELLOW   APPearance CLEAR CLEAR   Specific Gravity, Urine 1.019 1.005 - 1.030   pH 8.0 5.0 - 8.0   Glucose, UA NEGATIVE NEGATIVE mg/dL   Hgb urine dipstick NEGATIVE NEGATIVE   Bilirubin Urine NEGATIVE NEGATIVE   Ketones, ur NEGATIVE NEGATIVE mg/dL   Protein, ur NEGATIVE NEGATIVE mg/dL   Nitrite NEGATIVE NEGATIVE   Leukocytes, UA NEGATIVE NEGATIVE  hCG, quantitative, pregnancy     Status: Abnormal   Collection Time: 08/26/18  4:07 PM  Result Value Ref Range   hCG, Beta Chain, Quant, S 161,800 (H) <5 mIU/mL  Wet prep, genital     Status: Abnormal   Collection Time: 08/26/18  7:12 PM  Result Value Ref Range   Yeast Wet Prep HPF POC NONE SEEN NONE SEEN   Trich, Wet Prep NONE SEEN NONE SEEN   Clue Cells Wet Prep HPF POC NONE SEEN NONE SEEN   WBC, Wet Prep HPF POC FEW (A)  NONE SEEN   Sperm NONE SEEN     US Ob Less Than 14 Weeks With Ob Transvaginal  Result Date: 08/26/2018 CLINICAL DATA:  Pelvic cramping and vaginal bleeding. Estimated gestational age of [redacted] weeks, 36  days by LMP. EXAM: OBSTETRIC <14 WK [place] AND TRANSVAGINAL OB US TECHNIQUE: Both transabdominal and transvaginal ultrasound examinations were performed for complete evaluation of the gestation as well as the maternal uterus, adnexal regions, and pelvic cul-de-sac. Transvaginal technique was performed to assess early pregnancy. COMPARISON:  Pelvic ultrasound dated January 14, 2018. FINDINGS: Intrauterine gestational sac: Single Yolk sac:  Visualized. Embryo:  Visualized. Cardiac Activity: Visualized. Heart Rate: 175 bpm CRL:  19 mm   8 w   3 d                  [place] EDC: 04/05/2019 Subchorionic hemorrhage:  None visualized. Maternal uterus/adnexae: Multiple uterine fibroids again noted, measuring up to 2.7 cm. The left ovary is unremarkable. The right ovary is not visualized. IMPRESSION: 1. Single, live intrauterine pregnancy with estimated gestational age of [redacted] weeks, 3 days. 2. Fibroid uterus. Electronically Signed   By: Titus Dubin M.D.   On: 08/26/2018 20:37   Meds ordered this encounter  Medications  . acetaminophen (TYLENOL) tablet 1,000 mg    Assessment and Plan  --23 y.o. G2P0010 at [redacted]w[redacted]d by [place] today --Declines Rx for PNV --Discussed initiating [place] around 11 weeks --Discharge home in stable condition with bleeding/first trimester precautions  Darlina Rumpf, CNM 08/26/2018, 9:19 PM

## 2018-08-27 LAB — GC/CHLAMYDIA PROBE AMP (~~LOC~~) NOT AT ARMC
CHLAMYDIA, DNA PROBE: NEGATIVE
NEISSERIA GONORRHEA: NEGATIVE

## 2018-09-05 ENCOUNTER — Other Ambulatory Visit: Payer: Self-pay

## 2018-09-05 ENCOUNTER — Encounter (HOSPITAL_COMMUNITY): Payer: Self-pay | Admitting: *Deleted

## 2018-09-05 ENCOUNTER — Inpatient Hospital Stay (HOSPITAL_COMMUNITY)
Admission: AD | Admit: 2018-09-05 | Discharge: 2018-09-05 | Disposition: A | Discharge status: Home / Self Care | Payer: Medicaid Other | Attending: Obstetrics and Gynecology | Admitting: Obstetrics and Gynecology

## 2018-09-05 DIAGNOSIS — O209 Hemorrhage in early pregnancy, unspecified: Secondary | ICD-10-CM | POA: Insufficient documentation

## 2018-09-05 DIAGNOSIS — Z3A09 9 weeks gestation of pregnancy: Secondary | ICD-10-CM | POA: Insufficient documentation

## 2018-09-05 DIAGNOSIS — Z6790 Unspecified blood type, Rh positive: Secondary | ICD-10-CM

## 2018-09-05 LAB — CBC
HCT: 35 % — ABNORMAL LOW (ref 36.0–46.0)
Hemoglobin: 11.1 g/dL — ABNORMAL LOW (ref 12.0–15.0)
MCH: 27.7 pg (ref 26.0–34.0)
MCHC: 31.7 g/dL (ref 30.0–36.0)
MCV: 87.3 fL (ref 80.0–100.0)
Platelets: 300 10*3/uL (ref 150–400)
RBC: 4.01 MIL/uL (ref 3.87–5.11)
RDW: 16.1 % — ABNORMAL HIGH (ref 11.5–15.5)
WBC: 5.4 10*3/uL (ref 4.0–10.5)
nRBC: 0 % (ref 0.0–0.2)

## 2018-09-05 NOTE — Discharge Instructions (Signed)
Vaginal Bleeding During Pregnancy, First Trimester ° °A small amount of bleeding from the vagina (spotting) is relatively common during early pregnancy. It usually stops on its own. Various things may cause bleeding or spotting during early pregnancy. Some bleeding may be related to the pregnancy, and some may not. In many cases, the bleeding is normal and is not a problem. However, bleeding can also be a sign of something serious. Be sure to tell your health care provider about any vaginal bleeding right away. °Some possible causes of vaginal bleeding during the first trimester include: °· Infection or inflammation of the cervix. °· Growths (polyps) on the cervix. °· Miscarriage or threatened miscarriage. °· Pregnancy tissue developing outside of the uterus (ectopic pregnancy). °· A mass of tissue developing in the uterus due to an egg being fertilized incorrectly (molar pregnancy). °Follow these instructions at home: °Activity °· Follow instructions from your health care provider about limiting your activity. Ask what activities are safe for you. °· If needed, make plans for someone to help with your regular activities. °· Do not have sex or orgasms until your health care provider says that this is safe. °General instructions °· Take over-the-counter and prescription medicines only as told by your health care provider. °· Pay attention to any changes in your symptoms. °· Do not use tampons or douche. °· Write down how many pads you use each day, how often you change pads, and how soaked (saturated) they are. °· If you pass any tissue from your vagina, save the tissue so you can show it to your health care provider. °· Keep all follow-up visits as told by your health care provider. This is important. °Contact a health care provider if: °· You have vaginal bleeding during any part of your pregnancy. °· You have cramps or labor pains. °· You have a fever. °Get help right away if: °· You have severe cramps in your  back or abdomen. °· You pass large clots or a large amount of tissue from your vagina. °· Your bleeding increases. °· You feel light-headed or weak, or you faint. °· You have chills. °· You are leaking fluid or have a gush of fluid from your vagina. °Summary °· A small amount of bleeding (spotting) from the vagina is relatively common during early pregnancy. °· Various things may cause bleeding or spotting in early pregnancy. °· Be sure to tell your health care provider about any vaginal bleeding right away. °This information is not intended to replace advice given to you by your health care provider. Make sure you discuss any questions you have with your health care provider. °Document Released: 05/03/2005 Document Revised: 10/26/2016 Document Reviewed: 10/26/2016 °Elsevier Interactive Patient Education © 2019 [employer] ° °
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
## 2018-09-05 NOTE — MAU Note (Signed)
Urine in lab 

## 2018-09-16 ENCOUNTER — Other Ambulatory Visit (HOSPITAL_COMMUNITY)
Admission: RE | Admit: 2018-09-16 | Discharge: 2018-09-16 | Disposition: A | Discharge status: Home / Self Care | Payer: Medicaid Other | Source: Ambulatory Visit | Attending: Obstetrics and Gynecology | Admitting: Obstetrics and Gynecology

## 2018-09-16 ENCOUNTER — Encounter: Payer: Self-pay | Admitting: General Practice

## 2018-09-16 ENCOUNTER — Ambulatory Visit (INDEPENDENT_AMBULATORY_CARE_PROVIDER_SITE_OTHER): Payer: Self-pay | Admitting: *Deleted

## 2018-09-16 ENCOUNTER — Other Ambulatory Visit: Payer: Self-pay

## 2018-09-16 VITALS — BP 120/63 | HR 99 | Temp 97.7°F | Ht 65.0 in | Wt 201.6 lb

## 2018-09-16 DIAGNOSIS — N898 Other specified noninflammatory disorders of vagina: Secondary | ICD-10-CM | POA: Insufficient documentation

## 2018-09-16 DIAGNOSIS — Z3480 Encounter for supervision of other normal pregnancy, unspecified trimester: Secondary | ICD-10-CM | POA: Insufficient documentation

## 2018-09-16 DIAGNOSIS — O26899 Other specified pregnancy related conditions, unspecified trimester: Secondary | ICD-10-CM | POA: Insufficient documentation

## 2018-09-16 DIAGNOSIS — O26891 Other specified pregnancy related conditions, first trimester: Secondary | ICD-10-CM

## 2018-09-16 DIAGNOSIS — R300 Dysuria: Secondary | ICD-10-CM

## 2018-09-16 DIAGNOSIS — Z3481 Encounter for supervision of other normal pregnancy, first trimester: Secondary | ICD-10-CM

## 2018-09-16 LAB — POCT URINALYSIS DIPSTICK OB
Bilirubin, UA: NEGATIVE
Glucose, UA: NEGATIVE
Ketones, UA: NEGATIVE
Nitrite, UA: NEGATIVE
Spec Grav, UA: 1.025 (ref 1.010–1.025)
Urobilinogen, UA: 1 E.U./dL
pH, UA: 7 (ref 5.0–8.0)

## 2018-09-16 NOTE — Progress Notes (Signed)
   PRENATAL INTAKE SUMMARY  Ms. Wynes presents today New OB Nurse Interview.  OB History    Gravida  2   Para      Term      Preterm      AB  1   Living        SAB  1   TAB      Ectopic      Multiple      Live Births             I have reviewed the patient's medical, obstetrical, social, and family histories, medications, and available lab results.  SUBJECTIVE She has no unusual complaints and complains of yellow, itchy vaginal discharge and burning with urination x 1 week.  Miscarriage 03/20/2018.  Patient was seen in MAU twice last month for vaginal bleeding.  OBJECTIVE Initial nurse interview for history and lab work (New OB). No vaginal bleeding today.  EDD: 8/282020 confirmed by ultrasound GA: G2P0010 GA: [redacted]w[redacted]d FHT: 161  GENERAL APPEARANCE: alert, well appearing, in no apparent distress, oriented to person, place and time, overweight.   ASSESSMENT Normal pregnancy  PLAN Prenatal care-[facility] Pnl/HIV-reviewed previous labs from 02/2018. OB Urine Culture/Dip GC/CT(self-swab) HgbEval/SMA/CF-reviewed previous labs from 02/2018. A1C Continue PNV. Declined Flu vaccine [place] at next visit due to insurance.  Derl Barrow, RN

## 2018-09-17 LAB — HEMOGLOBIN A1C
Est. average glucose Bld gHb Est-mCnc: <74 mg/dL
Hgb A1c MFr Bld: <4.2 % — ABNORMAL LOW (ref 4.8–5.6)

## 2018-09-17 LAB — CERVICOVAGINAL ANCILLARY ONLY
Bacterial vaginitis: NEGATIVE
Candida vaginitis: NEGATIVE
Chlamydia: NEGATIVE
Neisseria Gonorrhea: NEGATIVE
Trichomonas: NEGATIVE

## 2018-09-18 LAB — URINE CULTURE, OB REFLEX

## 2018-09-18 LAB — CULTURE, OB URINE

## 2018-09-26 ENCOUNTER — Encounter: Payer: Self-pay | Admitting: General Practice

## 2018-09-27 ENCOUNTER — Encounter (HOSPITAL_COMMUNITY): Payer: Self-pay

## 2018-09-27 ENCOUNTER — Inpatient Hospital Stay (HOSPITAL_COMMUNITY)
Admission: AD | Admit: 2018-09-27 | Discharge: 2018-09-27 | Disposition: A | Discharge status: Home / Self Care | Payer: Medicaid Other | Attending: Obstetrics and Gynecology | Admitting: Obstetrics and Gynecology

## 2018-09-27 ENCOUNTER — Other Ambulatory Visit: Payer: Self-pay

## 2018-09-27 DIAGNOSIS — Z3A13 13 weeks gestation of pregnancy: Secondary | ICD-10-CM | POA: Insufficient documentation

## 2018-09-27 DIAGNOSIS — M545 Low back pain: Secondary | ICD-10-CM | POA: Insufficient documentation

## 2018-09-27 DIAGNOSIS — E860 Dehydration: Secondary | ICD-10-CM | POA: Insufficient documentation

## 2018-09-27 DIAGNOSIS — M549 Dorsalgia, unspecified: Secondary | ICD-10-CM

## 2018-09-27 DIAGNOSIS — O99281 Endocrine, nutritional and metabolic diseases complicating pregnancy, first trimester: Secondary | ICD-10-CM | POA: Insufficient documentation

## 2018-09-27 DIAGNOSIS — O26891 Other specified pregnancy related conditions, first trimester: Secondary | ICD-10-CM | POA: Insufficient documentation

## 2018-09-27 LAB — URINALYSIS, MICROSCOPIC (REFLEX)

## 2018-09-27 LAB — CBC
HCT: 37.1 % (ref 36.0–46.0)
Hemoglobin: 11.9 g/dL — ABNORMAL LOW (ref 12.0–15.0)
MCH: 29 pg (ref 26.0–34.0)
MCHC: 32.1 g/dL (ref 30.0–36.0)
MCV: 90.3 fL (ref 80.0–100.0)
NRBC: 0 % (ref 0.0–0.2)
Platelets: 346 10*3/uL (ref 150–400)
RBC: 4.11 MIL/uL (ref 3.87–5.11)
RDW: 14.4 % (ref 11.5–15.5)
WBC: 7.8 10*3/uL (ref 4.0–10.5)

## 2018-09-27 LAB — URINALYSIS, ROUTINE W REFLEX MICROSCOPIC
Bilirubin Urine: NEGATIVE
Glucose, UA: NEGATIVE mg/dL
Hgb urine dipstick: NEGATIVE
Ketones, ur: NEGATIVE mg/dL
NITRITE: NEGATIVE
Protein, ur: NEGATIVE mg/dL
Specific Gravity, Urine: >1.03 — ABNORMAL HIGH (ref 1.005–1.030)
pH: 6 (ref 5.0–8.0)

## 2018-09-27 MED ORDER — ACETAMINOPHEN 500 MG PO TABS
1000.0000 mg | ORAL_TABLET | Freq: Four times a day (QID) | ORAL | Status: DC | PRN
  Administered 2018-09-27: 1000 mg via ORAL
  Filled 2018-09-27: qty 2

## 2018-09-27 MED ORDER — CYCLOBENZAPRINE HCL 10 MG PO TABS
10.0000 mg | ORAL_TABLET | Freq: Three times a day (TID) | ORAL | Status: DC | PRN
  Administered 2018-09-27: 10 mg via ORAL
  Filled 2018-09-27: qty 1

## 2018-09-27 MED ORDER — CYCLOBENZAPRINE HCL 10 MG PO TABS: 10.0000 mg | ORAL_TABLET | Freq: Three times a day (TID) | ORAL | 0 refills | Status: DC | PRN

## 2018-09-27 NOTE — MAU Provider Note (Addendum)
History     CSN: [id number]  Arrival date and time: 09/27/18 1323   Chief Complaint  Patient presents with  . Dizziness  . Back Pain   G2P0010 @13 .0 wks presenting with lightheadedness and LBP. LBP started yesterday. Pain is located in center of lower back, no laterality. She admits to lifting her nephew recently, unsure of weight. No recent falls or injury. Endorses dysuria and increased frequency in last few days. No hematuria or urgency. No fevers, chills, body aches. Lightheadedness started 1 week ago. Occurs mostly in am, and when on her feet at work. No syncope. Admits to rare to no water intake. She drinking mostly apple juice and soda.    OB History    Gravida  2   Para      Term      Preterm      AB  1   Living        SAB  1   TAB      Ectopic      Multiple      Live Births              Past Medical History:  Diagnosis Date  . Depression   . Fibroid   . G6PD deficiency   . Supervision of normal first pregnancy, antepartum 02/18/2018    Nursing Staff Provider Office Location  [facility] Dating  LMP/6 week [place]  Language   English  Anatomy US   Flu Vaccine  n/a Genetic Screen  NIPS: low risk, female  AFP:   TDaP vaccine    Hgb A1C or  GTT Early  Third trimester  Rhogam     LAB RESULTS  Feeding Plan Breast Blood Type A/Positive/-- (07/15 0942)  Contraception  Antibody Negative (07/15 0942) Circumcision  Rubella 3.00 (07/15 1740) Pediatricia    Past Surgical History:  Procedure Laterality Date  . NO PAST SURGERIES      Family History  Problem Relation Age of Onset  . Mental illness Mother   . ADD / ADHD Neg Hx   . Anxiety disorder Neg Hx   . Alcohol abuse Neg Hx   . Arthritis Neg Hx   . Asthma Neg Hx   . Cancer Neg Hx   . Birth defects Neg Hx   . Depression Neg Hx   . COPD Neg Hx   . Diabetes Neg Hx   . Drug abuse Neg Hx   . Early death Neg Hx   . Hearing loss Neg Hx   . Heart disease Neg Hx   . Hyperlipidemia Neg Hx   . Hypertension Neg Hx   .  Intellectual disability Neg Hx   . Kidney disease Neg Hx   . Learning disabilities Neg Hx   . Miscarriages / Stillbirths Neg Hx   . Stroke Neg Hx   . Obesity Neg Hx   . Vision loss Neg Hx   . Varicose Veins Neg Hx     Social History   Tobacco Use  . Smoking status: Never Smoker  . Smokeless tobacco: Never Used  Substance Use Topics  . Alcohol use: No    Frequency: Never  . Drug use: No    Allergies:  Allergies  Allergen Reactions  . Blue Dyes (Parenteral) Other (See Comments)    G6PD-- Decreased hemoglobin  . Codeine Other (See Comments)    Decreased hemoglobin  . Other Other (See Comments)    G6PD--Decreased hemoglobin to potato chips (not potatoes in other forms)  Medications Prior to Admission  Medication Sig Dispense Refill Last Dose  . Prenatal Vit-Fe Fumarate-FA (PRENATAL MULTIVITAMIN) TABS tablet Take 1 tablet by mouth daily at 12 noon.   Taking    Review of Systems  Constitutional: Negative for chills and fever.  Respiratory: Negative for shortness of breath.   Cardiovascular: Negative for chest pain.  Gastrointestinal: Negative for abdominal pain.  Genitourinary: Positive for dysuria and frequency. Negative for hematuria, urgency and vaginal bleeding.  Musculoskeletal: Positive for back pain.  Neurological: Positive for light-headedness. Negative for syncope.   Physical Exam   Blood pressure 138/75, pulse (!) 138, temperature 98.6 F (37 C), temperature source Oral, resp. rate 16, last menstrual period 07/03/2018, SpO2 99 %. Orthostatic VS for the past 24 hrs:  BP- Lying Pulse- Lying BP- Sitting Pulse- Sitting BP- Standing at 0 minutes Pulse- Standing at 0 minutes  09/27/18 1403 - - - - 128/64 126  09/27/18 1402 - - 127/70 104 - -  09/27/18 1401 133/61 111 - - - -    Physical Exam  Nursing note and vitals reviewed. Constitutional: She is oriented to person, place, and time. She appears well-developed and well-nourished. No distress.  HENT:   Head: Normocephalic and atraumatic.  Neck: Normal range of motion.  Cardiovascular: Regular rhythm and normal heart sounds.  tachy  Respiratory: Effort normal. No respiratory distress. She has no wheezes. She has no rales.  GI: Soft. She exhibits no distension and no mass. There is no abdominal tenderness. There is no rebound, no guarding and no CVA tenderness.  Musculoskeletal: Normal range of motion.     Cervical back: Normal.     Thoracic back: Normal.     Lumbar back: Normal.  Neurological: She is alert and oriented to person, place, and time.  Skin: Skin is warm and dry.  Psychiatric: She has a normal mood and affect.   Limited bedside US: viable, active fetus, +cardiac activity>148 bpm, subj. nml AFV   Results for orders placed or performed during the hospital encounter of 09/27/18 (from the past 24 hour(s))  Urinalysis, Routine w reflex microscopic     Status: Abnormal   Collection Time: 09/27/18  1:47 PM  Result Value Ref Range   Color, Urine YELLOW YELLOW   APPearance HAZY (A) CLEAR   Specific Gravity, Urine >1.030 (H) 1.005 - 1.030   pH 6.0 5.0 - 8.0   Glucose, UA NEGATIVE NEGATIVE mg/dL   Hgb urine dipstick NEGATIVE NEGATIVE   Bilirubin Urine NEGATIVE NEGATIVE   Ketones, ur NEGATIVE NEGATIVE mg/dL   Protein, ur NEGATIVE NEGATIVE mg/dL   Nitrite NEGATIVE NEGATIVE   Leukocytes,Ua SMALL (A) NEGATIVE  Urinalysis, Microscopic (reflex)     Status: Abnormal   Collection Time: 09/27/18  1:47 PM  Result Value Ref Range   RBC / HPF 0-5 0 - 5 RBC/hpf   WBC, UA 0-5 0 - 5 WBC/hpf   Bacteria, UA FEW (A) NONE SEEN   Squamous Epithelial / LPF 6-10 0 - 5  CBC     Status: Abnormal   Collection Time: 09/27/18  2:15 PM  Result Value Ref Range   WBC 7.8 4.0 - 10.5 K/uL   RBC 4.11 3.87 - 5.11 MIL/uL   Hemoglobin 11.9 (L) 12.0 - 15.0 g/dL   HCT 37.1 36.0 - 46.0 %   MCV 90.3 80.0 - 100.0 fL   MCH 29.0 26.0 - 34.0 pg   MCHC 32.1 30.0 - 36.0 g/dL   RDW 14.4 11.5 - 15.5 %  Platelets 346 150 - 400 K/uL   nRBC 0.0 0.0 - 0.2 %   MAU Course  Procedures Orders Placed This Encounter  Procedures  . Culture, OB Urine    Standing Status:   Standing    Number of Occurrences:   1  . Urinalysis, Routine w reflex microscopic    Standing Status:   Standing    Number of Occurrences:   1  . CBC    Standing Status:   Standing    Number of Occurrences:   1  . Urinalysis, Microscopic (reflex)    Standing Status:   Standing    Number of Occurrences:   1  . Orthostatic vital signs    Standing Status:   Standing    Number of Occurrences:   1   Meds ordered this encounter  Medications  . cyclobenzaprine (FLEXERIL) tablet 10 mg  . acetaminophen (TYLENOL) tablet 1,000 mg  PO hydration MDM Labs ordered and reviewed. No evidence of UTI, UC sent since symptomatic. Dehydration likely causing dizziness. Back pain MSK, feels better after meds. Stable for discharge home.  Assessment and Plan   1. [redacted] weeks gestation of pregnancy   2. Dehydration   3. Musculoskeletal back pain    Discharge home [facility] up at [facility] as scheduled (switching from [facility]) Drink 5-6 bottles of water per day Limit juice and soda intake Weight lift restriction 20 lbs  Allergies as of 09/27/2018      Reactions   Blue Dyes (parenteral) Other (See Comments)   G6PD-- Decreased hemoglobin   Codeine Other (See Comments)   Decreased hemoglobin   Other Other (See Comments)   G6PD--Decreased hemoglobin to potato chips (not potatoes in other forms)      Medication List    TAKE these medications   cyclobenzaprine 10 MG tablet Commonly known as:  FLEXERIL Take 1 tablet (10 mg total) by mouth 3 (three) times daily as needed for muscle spasms.   prenatal multivitamin Tabs tablet Take 1 tablet by mouth daily at 12 noon.      Julianne Handler, CNM 09/27/2018, 2:31 PM

## 2018-09-27 NOTE — MAU Note (Signed)
Pt states she's had the dizziness for a week.

## 2018-09-27 NOTE — MAU Note (Signed)
PT presents to MAU with complaints of lower back pain and dizziness since last night. Denies any VB

## 2018-09-27 NOTE — Discharge Instructions (Signed)
Dehydration, Adult  Dehydration is when there is not enough fluid or water in your body. This happens when you lose more fluids than you take in. Dehydration can range from mild to very bad. It should be treated right away to keep it from getting very bad. Symptoms of mild dehydration may include:  Thirst.  Dry lips.  Slightly dry mouth.  Dry, warm skin.  Dizziness. Symptoms of moderate dehydration may include:  Very dry mouth.  Muscle cramps.  Dark pee (urine). Pee may be the color of tea.  Your body making less pee.  Your eyes making fewer tears.  Heartbeat that is uneven or faster than normal (palpitations).  Headache.  Light-headedness, especially when you stand up from sitting.  Fainting (syncope). Symptoms of very bad dehydration may include:  Changes in skin, such as: ? Cold and clammy skin. ? Blotchy (mottled) or pale skin. ? Skin that does not quickly return to normal after being lightly pinched and let go (poor skin turgor).  Changes in body fluids, such as: ? Feeling very thirsty. ? Your eyes making fewer tears. ? Not sweating when body temperature is high, such as in hot weather. ? Your body making very little pee.  Changes in vital signs, such as: ? Weak pulse. ? Pulse that is more than 100 beats a minute when you are sitting still. ? Fast breathing. ? Low blood pressure.  Other changes, such as: ? Sunken eyes. ? Cold hands and feet. ? Confusion. ? Lack of energy (lethargy). ? Trouble waking up from sleep. ? Short-term weight loss. ? Unconsciousness. Follow these instructions at home:   If told by your doctor, drink an ORS: ? Make an ORS by using instructions on the package. ? Start by drinking small amounts, about  cup (120 mL) every 5-10 minutes. ? Slowly drink more until you have had the amount that your doctor said to have.  Drink enough clear fluid to keep your pee clear or pale yellow. If you were told to drink an ORS, finish the  ORS first, then start slowly drinking clear fluids. Drink fluids such as: ? Water. Do not drink only water by itself. Doing that can make the salt (sodium) level in your body get too low (hyponatremia). ? Ice chips. ? Fruit juice that you have added water to (diluted). ? Low-calorie sports drinks.  Avoid: ? Alcohol. ? Drinks that have a lot of sugar. These include high-calorie sports drinks, fruit juice that does not have water added, and soda. ? Caffeine. ? Foods that are greasy or have a lot of fat or sugar.  Take over-the-counter and prescription medicines only as told by your doctor.  Do not take salt tablets. Doing that can make the salt level in your body get too high (hypernatremia).  Eat foods that have minerals (electrolytes). Examples include bananas, oranges, potatoes, tomatoes, and spinach.  Keep all follow-up visits as told by your doctor. This is important. Contact a doctor if:  You have belly (abdominal) pain that: ? Gets worse. ? Stays in one area (localizes).  You have a rash.  You have a stiff neck.  You get angry or annoyed more easily than normal (irritability).  You are more sleepy than normal.  You have a harder time waking up than normal.  You feel: ? Weak. ? Dizzy. ? Very thirsty.  You have peed (urinated) only a small amount of very dark pee during 6-8 hours. Get help right away if:  You have  symptoms of very bad dehydration.  You cannot drink fluids without throwing up (vomiting).  Your symptoms get worse with treatment.  You have a fever.  You have a very bad headache.  You are throwing up or having watery poop (diarrhea) and it: ? Gets worse. ? Does not go away.  You have blood or something green (bile) in your throw-up.  You have blood in your poop (stool). This may cause poop to look black and tarry.  You have not peed in 6-8 hours.  You pass out (faint).  Your heart rate when you are sitting still is more than 100 beats a  minute.  You have trouble breathing. This information is not intended to replace advice given to you by your health care provider. Make sure you discuss any questions you have with your health care provider. Document Released: 05/20/2009 Document Revised: 02/11/2016 Document Reviewed: 09/17/2015 Elsevier Interactive Patient Education  2019 [employer]   Back Pain in Pregnancy Back pain during pregnancy is common. Back pain may be caused by several factors that are related to changes during your pregnancy. Follow these instructions at home: Managing pain, stiffness, and swelling      If directed, for sudden (acute) back pain, put ice on the painful area. ? Put ice in a plastic bag. ? Place a towel between your skin and the bag. ? Leave the ice on for 20 minutes, 2-3 times per day.  If directed, apply heat to the affected area before you exercise. Use the heat source that your health care provider recommends, such as a moist heat pack or a heating pad. ? Place a towel between your skin and the heat source. ? Leave the heat on for 20-30 minutes. ? Remove the heat if your skin turns bright red. This is especially important if you are unable to feel pain, heat, or cold. You may have a greater risk of getting burned.  If directed, massage the affected area. Activity  Exercise as told by your health care provider. Gentle exercise is the best way to prevent or manage back pain.  Listen to your body when lifting. If lifting hurts, ask for help or bend your knees. This uses your leg muscles instead of your back muscles.  Squat down when picking up something from the floor. Do not bend over.  Only use bed rest for short periods as told by your health care provider. Bed rest should only be used for the most severe episodes of back pain. Standing, sitting, and lying down  Do not stand in one place for long periods of time.  Use good posture when sitting. Make sure your head rests over  your shoulders and is not hanging forward. Use a pillow on your lower back if necessary.  Try sleeping on your side, preferably the left side, with a pregnancy support pillow or 1-2 regular pillows between your legs. ? If you have back pain after a night's rest, your bed may be too soft. ? A firm mattress may provide more support for your back during pregnancy. General instructions  Do not wear high heels.  Eat a healthy diet. Try to gain weight within your health care provider's recommendations.  Use a maternity girdle, elastic sling, or back brace as told by your health care provider.  Take over-the-counter and prescription medicines only as told by your health care provider.  Work with a physical therapist or massage therapist to find ways to manage back pain. Acupuncture or massage  therapy may be helpful.  Keep all follow-up visits as told by your health care provider. This is important. Contact a health care provider if:  Your back pain interferes with your daily activities.  You have increasing pain in other parts of your body. Get help right away if:  You develop numbness, tingling, weakness, or problems with the use of your arms or legs.  You develop severe back pain that is not controlled with medicine.  You have a change in bowel or bladder control.  You develop shortness of breath, dizziness, or you faint.  You develop nausea, vomiting, or sweating.  You have back pain that is a rhythmic, cramping pain similar to labor pains. Labor pain is usually 1-2 minutes apart, lasts for about 1 minute, and involves a bearing down feeling or pressure in your pelvis.  You have back pain and your water breaks or you have vaginal bleeding.  You have back pain or numbness that travels down your leg.  Your back pain developed after you fell.  You develop pain on one side of your back.  You see blood in your urine.  You develop skin blisters in the area of your back  pain. Summary  Back pain may be caused by several factors that are related to changes during your pregnancy.  Follow instructions as told by your health care provider for managing pain, stiffness, and swelling.  Exercise as told by your health care provider. Gentle exercise is the best way to prevent or manage back pain.  Take over-the-counter and prescription medicines only as told by your health care provider.  Keep all follow-up visits as told by your health care provider. This is important. This information is not intended to replace advice given to you by your health care provider. Make sure you discuss any questions you have with your health care provider. Document Released: 11/01/2005 Document Revised: 01/09/2018 Document Reviewed: 01/09/2018 Elsevier Interactive Patient Education  2019 [employer].

## 2018-09-29 LAB — CULTURE, OB URINE

## 2018-10-01 ENCOUNTER — Inpatient Hospital Stay (HOSPITAL_COMMUNITY)
Admission: AD | Admit: 2018-10-01 | Discharge: 2018-10-01 | Disposition: A | Discharge status: Home / Self Care | Payer: Medicaid Other | Attending: Obstetrics & Gynecology | Admitting: Obstetrics & Gynecology

## 2018-10-01 ENCOUNTER — Encounter (HOSPITAL_COMMUNITY): Payer: Self-pay

## 2018-10-01 ENCOUNTER — Other Ambulatory Visit: Payer: Self-pay

## 2018-10-01 DIAGNOSIS — Z3480 Encounter for supervision of other normal pregnancy, unspecified trimester: Secondary | ICD-10-CM

## 2018-10-01 DIAGNOSIS — Z3A13 13 weeks gestation of pregnancy: Secondary | ICD-10-CM

## 2018-10-01 DIAGNOSIS — N3000 Acute cystitis without hematuria: Secondary | ICD-10-CM

## 2018-10-01 DIAGNOSIS — O26891 Other specified pregnancy related conditions, first trimester: Secondary | ICD-10-CM

## 2018-10-01 DIAGNOSIS — R102 Pelvic and perineal pain: Secondary | ICD-10-CM

## 2018-10-01 DIAGNOSIS — Z20828 Contact with and (suspected) exposure to other viral communicable diseases: Secondary | ICD-10-CM

## 2018-10-01 DIAGNOSIS — O2341 Unspecified infection of urinary tract in pregnancy, first trimester: Secondary | ICD-10-CM | POA: Insufficient documentation

## 2018-10-01 DIAGNOSIS — O26899 Other specified pregnancy related conditions, unspecified trimester: Secondary | ICD-10-CM

## 2018-10-01 DIAGNOSIS — F419 Anxiety disorder, unspecified: Secondary | ICD-10-CM | POA: Insufficient documentation

## 2018-10-01 DIAGNOSIS — O99341 Other mental disorders complicating pregnancy, first trimester: Secondary | ICD-10-CM | POA: Insufficient documentation

## 2018-10-01 DIAGNOSIS — Z8759 Personal history of other complications of pregnancy, childbirth and the puerperium: Secondary | ICD-10-CM

## 2018-10-01 LAB — URINALYSIS, ROUTINE W REFLEX MICROSCOPIC
Bacteria, UA: NONE SEEN
Bilirubin Urine: NEGATIVE
Glucose, UA: NEGATIVE mg/dL
Hgb urine dipstick: NEGATIVE
Ketones, ur: NEGATIVE mg/dL
Nitrite: NEGATIVE
Protein, ur: NEGATIVE mg/dL
Specific Gravity, Urine: 1.013 (ref 1.005–1.030)
pH: 8 (ref 5.0–8.0)

## 2018-10-01 LAB — WET PREP, GENITAL
Clue Cells Wet Prep HPF POC: NONE SEEN
SPERM: NONE SEEN
Trich, Wet Prep: NONE SEEN
Yeast Wet Prep HPF POC: NONE SEEN

## 2018-10-01 MED ORDER — OSELTAMIVIR PHOSPHATE 75 MG PO CAPS: 75.0000 mg | ORAL_CAPSULE | Freq: Every day | ORAL | 0 refills | Status: DC

## 2018-10-01 MED ORDER — CEPHALEXIN 250 MG PO TABS: 250.0000 mg | ORAL_TABLET | Freq: Four times a day (QID) | ORAL | 0 refills | Status: AC

## 2018-10-01 MED ORDER — PHENAZOPYRIDINE HCL 95 MG PO TABS: ORAL_TABLET | ORAL | 0 refills | Status: DC

## 2018-10-01 NOTE — MAU Provider Note (Addendum)
History    CSN: [id number] Arrival date and time: 10/01/18 1131  Chief Complaint  Patient presents with  . Vaginal Pain   HPI Sandra Macdonald is a 23yo G2P0010 at [redacted]w[redacted]d who presents for pelvic pressure. States that woke up this morning and started to feel the pressure in her vagina and her pelvic area, worse with urinating. Denies pain with urination. States having discharge but unchanged in last few weeks. Denies any itching or pain with discharge. Has had more discharge since pregnancy. She is most worried about having another miscarriage like she did in August. States her water broke then. Was seen here a few days ago with some urinary symptoms and back pain. Was given Flexeril for back pain but reports minimal improvement. No new sexual partners. Also reports diarrhea that started last night, had three episodes and then another two this morning. Does not normally get diarrhea. No fever, cold. States mother just diagnosed with flu yesterday.   OB History    Gravida  2   Para      Term      Preterm      AB  1   Living        SAB  1   TAB      Ectopic      Multiple      Live Births              Past Medical History:  Diagnosis Date  . Depression   . Fibroid   . G6PD deficiency   . Supervision of normal first pregnancy, antepartum 02/18/2018    Nursing Staff Provider Office Location  [facility] Dating  LMP/6 week [place]  Language   English  Anatomy US   Flu Vaccine  n/a Genetic Screen  NIPS: low risk, female  AFP:   TDaP vaccine    Hgb A1C or  GTT Early  Third trimester  Rhogam     LAB RESULTS  Feeding Plan Breast Blood Type A/Positive/-- (07/15 0942)  Contraception  Antibody Negative (07/15 0942) Circumcision  Rubella 3.00 (07/15 4098) Pediatricia    Past Surgical History:  Procedure Laterality Date  . NO PAST SURGERIES      Family History  Problem Relation Age of Onset  . Mental illness Mother   . ADD / ADHD Neg Hx   . Anxiety disorder Neg Hx   . Alcohol abuse Neg Hx   .  Arthritis Neg Hx   . Asthma Neg Hx   . Cancer Neg Hx   . Birth defects Neg Hx   . Depression Neg Hx   . COPD Neg Hx   . Diabetes Neg Hx   . Drug abuse Neg Hx   . Early death Neg Hx   . Hearing loss Neg Hx   . Heart disease Neg Hx   . Hyperlipidemia Neg Hx   . Hypertension Neg Hx   . Intellectual disability Neg Hx   . Kidney disease Neg Hx   . Learning disabilities Neg Hx   . Miscarriages / Stillbirths Neg Hx   . Stroke Neg Hx   . Obesity Neg Hx   . Vision loss Neg Hx   . Varicose Veins Neg Hx     Social History   Tobacco Use  . Smoking status: Never Smoker  . Smokeless tobacco: Never Used  Substance Use Topics  . Alcohol use: No    Frequency: Never  . Drug use: No    Allergies:  Allergies  Allergen  Reactions  . Blue Dyes (Parenteral) Other (See Comments)    G6PD-- Decreased hemoglobin  . Codeine Other (See Comments)    Decreased hemoglobin  . Other Other (See Comments)    G6PD--Decreased hemoglobin to potato chips (not potatoes in other forms)    Medications Prior to Admission  Medication Sig Dispense Refill Last Dose  . cyclobenzaprine (FLEXERIL) 10 MG tablet Take 1 tablet (10 mg total) by mouth 3 (three) times daily as needed for muscle spasms. 20 tablet 0   . Prenatal Vit-Fe Fumarate-FA (PRENATAL MULTIVITAMIN) TABS tablet Take 1 tablet by mouth daily at 12 noon.   Taking    Review of Systems  Constitutional: Negative for activity change, appetite change, chills and fever.  Gastrointestinal: Positive for nausea. Negative for abdominal pain and vomiting.  Endocrine: Negative for polyuria.  Genitourinary: Positive for pelvic pain and urgency. Negative for dysuria, vaginal bleeding, vaginal discharge and vaginal pain.  Musculoskeletal: Positive for back pain (unchanged from prior).  Psychiatric/Behavioral: The patient is nervous/anxious.    Physical Exam   Blood pressure 132/76, pulse 99, temperature 98.2 F (36.8 C), temperature source Oral, resp. rate  18, weight 90.9 kg, last menstrual period 07/03/2018, SpO2 100 %.  Physical Exam  Nursing note and vitals reviewed. Constitutional: She is oriented to person, place, and time. She appears well-developed and well-nourished. No distress.  HENT:  Head: Normocephalic and atraumatic.  Eyes: Conjunctivae and EOM are normal. No scleral icterus.  Cardiovascular: Normal rate.  Respiratory: Effort normal. No respiratory distress.  GI: Soft.  Suprapubic TTP  Genitourinary:    Genitourinary Comments: Normal appearing external genitalia  normal vaginal mucosa with small amount of clear-white discharge, cervical ectropion noted  no CMT but tenderness with bimanual exam  subjectively normal cervical length palpated, SVE closed/thick   Musculoskeletal:        General: No edema.  Neurological: She is alert and oriented to person, place, and time.  Skin: Skin is warm and dry.  Psychiatric:  Tearful when discussing history of miscarriage   FHT 150s  MAU Course  Procedures  MDM -- U/A still with only trace leukocytes, essentially same from four days ago - given symptoms will plan to treat for UTI and follow-up urine culture -- wet prep w/o evidence of infection, G/C pending  Assessment and Plan  23yo G2P0010 at [redacted]w[redacted]d who presented to MAU with complaint of pelvic pressure worsening with urination. Persistent symptoms since visit four days ago, so will treat for UTI and follow-up urine culture. Will also Rx pyridium for urinary discomfort, encouraged copious fluids. Discussed will call with results.   Patient also with positive flu exposure (mother) yesterday. Will give Tamiflu prophylaxis. Counseled on risk of worsening of diarrhea. Advised probiotics.   Patient tearful and with anxiety regarding symptoms and risk of miscarriage. Experienced miscarriage around 15 weeks about 6 months ago. Counseled that miscarriage can be common, not anything she did wrong last year. Advised to follow-up for routine  prenatal appointment. Reviewed return precautions. Patient voiced understanding of the plan.   Glenice Bow, DO 10/01/2018, 1:09 PM

## 2018-10-01 NOTE — MAU Note (Signed)
Keep having a lot of pressure in her vagina and pressure when she pees. Started this morning.

## 2018-10-02 ENCOUNTER — Other Ambulatory Visit: Payer: Self-pay

## 2018-10-02 ENCOUNTER — Inpatient Hospital Stay (HOSPITAL_COMMUNITY)
Admission: AD | Admit: 2018-10-02 | Discharge: 2018-10-02 | Disposition: A | Discharge status: Home / Self Care | Payer: Medicaid Other | Attending: Obstetrics & Gynecology | Admitting: Obstetrics & Gynecology

## 2018-10-02 ENCOUNTER — Encounter (HOSPITAL_COMMUNITY): Payer: Self-pay

## 2018-10-02 DIAGNOSIS — N930 Postcoital and contact bleeding: Secondary | ICD-10-CM | POA: Insufficient documentation

## 2018-10-02 DIAGNOSIS — O26891 Other specified pregnancy related conditions, first trimester: Secondary | ICD-10-CM

## 2018-10-02 DIAGNOSIS — O26892 Other specified pregnancy related conditions, second trimester: Secondary | ICD-10-CM | POA: Insufficient documentation

## 2018-10-02 DIAGNOSIS — Z3A13 13 weeks gestation of pregnancy: Secondary | ICD-10-CM

## 2018-10-02 DIAGNOSIS — Z3A15 15 weeks gestation of pregnancy: Secondary | ICD-10-CM | POA: Insufficient documentation

## 2018-10-02 LAB — CULTURE, OB URINE: Culture: 20000 — AB

## 2018-10-02 LAB — URINALYSIS, ROUTINE W REFLEX MICROSCOPIC
Bilirubin Urine: NEGATIVE
Glucose, UA: NEGATIVE mg/dL
Hgb urine dipstick: NEGATIVE
Ketones, ur: NEGATIVE mg/dL
Leukocytes,Ua: NEGATIVE
Nitrite: NEGATIVE
Protein, ur: NEGATIVE mg/dL
SPECIFIC GRAVITY, URINE: 1.018 (ref 1.005–1.030)
pH: 6 (ref 5.0–8.0)

## 2018-10-02 NOTE — Discharge Instructions (Signed)

## 2018-10-02 NOTE — MAU Note (Signed)
Pt states that she had spotting when wiping after having sex today.

## 2018-10-02 NOTE — MAU Provider Note (Signed)
History     CSN: [id number]  Arrival date and time: 10/02/18 1933   First Provider Initiated Contact with Patient 10/02/18 2006      Chief Complaint  Patient presents with  . Vaginal Bleeding    spotting   HPI Sandra Macdonald is a 23 y.o. G2P0010 at [redacted]w[redacted]d who presents to MAU with chief complaint of postcoital spotting. She endorses sexual intercourse with her boyfriend around 6:30pm today. Afterwards she voided and noticed a spot of blood on her toilet paper after wiping. She denies continuous bleeding, abdominal pain, urinary symptoms, abnormal vaginal discharge, fever, falls, or recent illness.    Sandra Macdonald is s/p evaluation in MAU yesterday 10/01/2018 for chief complaint of vaginal pain. She has not started her medication yet but plans to purchase her prescriptions tomorrow morning.  Patient's history is significant for 15 week loss. She is very anxious about her current pregnancy but feeling "more confident" than when previously seen in MAU.  OB History    Gravida  2   Para      Term      Preterm      AB  1   Living        SAB  1   TAB      Ectopic      Multiple      Live Births              Past Medical History:  Diagnosis Date  . Depression   . Fibroid   . G6PD deficiency   . Supervision of normal first pregnancy, antepartum 02/18/2018    Nursing Staff Provider Office Location  [facility] Dating  LMP/6 week [place]  Language   English  Anatomy US   Flu Vaccine  n/a Genetic Screen  NIPS: low risk, female  AFP:   TDaP vaccine    Hgb A1C or  GTT Early  Third trimester  Rhogam     LAB RESULTS  Feeding Plan Breast Blood Type A/Positive/-- (07/15 0942)  Contraception  Antibody Negative (07/15 0942) Circumcision  Rubella 3.00 (07/15 2542) Pediatricia    Past Surgical History:  Procedure Laterality Date  . NO PAST SURGERIES      Family History  Problem Relation Age of Onset  . Mental illness Mother   . ADD / ADHD Neg Hx   . Anxiety disorder Neg Hx   . Alcohol abuse Neg  Hx   . Arthritis Neg Hx   . Asthma Neg Hx   . Cancer Neg Hx   . Birth defects Neg Hx   . Depression Neg Hx   . COPD Neg Hx   . Diabetes Neg Hx   . Drug abuse Neg Hx   . Early death Neg Hx   . Hearing loss Neg Hx   . Heart disease Neg Hx   . Hyperlipidemia Neg Hx   . Hypertension Neg Hx   . Intellectual disability Neg Hx   . Kidney disease Neg Hx   . Learning disabilities Neg Hx   . Miscarriages / Stillbirths Neg Hx   . Stroke Neg Hx   . Obesity Neg Hx   . Vision loss Neg Hx   . Varicose Veins Neg Hx     Social History   Tobacco Use  . Smoking status: Never Smoker  . Smokeless tobacco: Never Used  Substance Use Topics  . Alcohol use: No    Frequency: Never  . Drug use: No    Allergies:  Allergies  Allergen Reactions  . Blue Dyes (Parenteral) Other (See Comments)    G6PD-- Decreased hemoglobin  . Codeine Other (See Comments)    Decreased hemoglobin  . Other Other (See Comments)    G6PD--Decreased hemoglobin to potato chips (not potatoes in other forms)    Medications Prior to Admission  Medication Sig Dispense Refill Last Dose  . Cephalexin 250 MG tablet Take 1 tablet (250 mg total) by mouth 4 (four) times daily for 5 days. 20 tablet 0 10/02/2018 at Unknown time  . Prenatal Vit-Fe Fumarate-FA (PRENATAL MULTIVITAMIN) TABS tablet Take 1 tablet by mouth daily at 12 noon.   10/02/2018 at Unknown time  . cyclobenzaprine (FLEXERIL) 10 MG tablet Take 1 tablet (10 mg total) by mouth 3 (three) times daily as needed for muscle spasms. 20 tablet 0   . oseltamivir (TAMIFLU) 75 MG capsule Take 1 capsule (75 mg total) by mouth daily. 10 capsule 0   . phenazopyridine (PYRIDIUM) 95 MG tablet Take two tablets three times a day as needed for urinary discomfort. 12 tablet 0     Review of Systems  Constitutional: Negative for chills, fatigue and fever.  Respiratory: Negative for shortness of breath.   Gastrointestinal: Negative for abdominal pain.  Genitourinary: Positive for  vaginal bleeding. Negative for vaginal discharge and vaginal pain.  Musculoskeletal: Negative for back pain.  Neurological: Negative for headaches.  All other systems reviewed and are negative.  Physical Exam   Blood pressure 118/67, pulse (!) 110, temperature 98.2 F (36.8 C), temperature source Oral, resp. rate 20, weight 91.9 kg, last menstrual period 07/03/2018, SpO2 96 %.  Physical Exam  Nursing note and vitals reviewed. Constitutional: She is oriented to person, place, and time. She appears well-developed and well-nourished.  Cardiovascular: Normal rate.  Respiratory: Effort normal. No respiratory distress.  GI: Soft. She exhibits no distension. There is no abdominal tenderness. There is no rebound and no guarding.  Genitourinary:    Vagina and uterus normal.     No vaginal discharge.   Musculoskeletal: Normal range of motion.  Neurological: She is alert and oriented to person, place, and time.  Skin: Skin is warm and dry.  Psychiatric: She has a normal mood and affect. Her behavior is normal. Judgment and thought content normal.   No bleeding or abnormal discharge on spec exam  MAU Course/MDM  Procedures: sterile speculum exam  --Patient triaged yesterday 10/01/2018 around 1pm. Discussed that re-collecting those labs will not be helpful --Discussed addition of lubricant to minimize friction during intercourse. Patient to also consider pelvic rest until new ob appointment due to anxiety  Patient Vitals for the past 24 hrs:  BP Temp Temp src Pulse Resp SpO2 Weight  10/02/18 2037 127/63 - - 96 - - -  10/02/18 1952 118/67 98.2 F (36.8 C) Oral (!) 110 20 96 % -  10/02/18 1944 - - - - - - 91.9 kg    Assessment and Plan  --23 y.o. G2P0010 at [redacted]w[redacted]d  --Postcoital spotting x 1 episode --FHT 160bpm by Doppler --Discharge home in stable condition  F/U: Patient has New OB at [facility] scheduled for 10/14/2018  Darlina Rumpf 10/02/2018, 8:20 PM

## 2018-10-03 ENCOUNTER — Encounter: Payer: Self-pay | Admitting: Obstetrics and Gynecology

## 2018-10-03 LAB — GC/CHLAMYDIA PROBE AMP (~~LOC~~) NOT AT ARMC
Chlamydia: NEGATIVE
Neisseria Gonorrhea: NEGATIVE

## 2018-10-07 ENCOUNTER — Other Ambulatory Visit: Payer: Self-pay

## 2018-10-07 ENCOUNTER — Inpatient Hospital Stay (HOSPITAL_COMMUNITY)
Admission: AD | Admit: 2018-10-07 | Discharge: 2018-10-07 | Disposition: A | Discharge status: Home / Self Care | Payer: Medicaid Other | Attending: Obstetrics and Gynecology | Admitting: Obstetrics and Gynecology

## 2018-10-07 DIAGNOSIS — Z3A14 14 weeks gestation of pregnancy: Secondary | ICD-10-CM | POA: Insufficient documentation

## 2018-10-07 DIAGNOSIS — Z3480 Encounter for supervision of other normal pregnancy, unspecified trimester: Secondary | ICD-10-CM

## 2018-10-07 DIAGNOSIS — R109 Unspecified abdominal pain: Secondary | ICD-10-CM | POA: Insufficient documentation

## 2018-10-07 DIAGNOSIS — W228XXA Striking against or struck by other objects, initial encounter: Secondary | ICD-10-CM | POA: Insufficient documentation

## 2018-10-07 DIAGNOSIS — O26892 Other specified pregnancy related conditions, second trimester: Secondary | ICD-10-CM

## 2018-10-07 DIAGNOSIS — S3991XA Unspecified injury of abdomen, initial encounter: Secondary | ICD-10-CM
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
## 2018-10-07 NOTE — Discharge Instructions (Signed)
Preventing Injuries During Pregnancy ° °Injuries can happen during pregnancy. Minor falls and accidents usually do not harm you or your baby. But some injuries can harm you and your baby. Tell your doctor about any injury you suffer. °What can I do to avoid injuries? °Safety °· Remove rugs and loose objects on the floor. °· Wear comfortable shoes that have a good grip. Do not wear shoes that have high heels. °· Always wear your seat belt in the car. The lap belt should be below your belly. Always drive safely. °· Do not ride on a motorcycle. °Activity °· Do not take part in rough and violent activities or sports. °· Avoid: °? Walking on wet or slippery floors. °? Lifting heavy pots of boiling or hot liquids. °? Fixing electrical problems. °? Being near fires. °General instructions °· Take over-the-counter and prescription medicines only as told by your doctor. °· Know your blood type and the blood type of the baby's father. °· If you are a victim of domestic violence: °? Call your local emergency services (911 in the [place] °? Contact the National Domestic Violence Hotline for help and support. °Get help right away if: °· You fall on your belly or receive any serious blow to your belly. °· You have a stiff neck or neck pain after a fall or an injury. °· You get a headache or have problems with vision after an injury. °· You do not feel the baby move or the baby is not moving as much as normal. °· You have been a victim of domestic violence or any other kind of attack. °· You have been in a car accident. °· You have bleeding from your vagina. °· Fluid is leaking from your vagina. °· You start to have cramping or pain in your belly (contractions). °· You have very bad pain in your lower back. °· You feel weak or pass out (faint). °· You start to throw up (vomit) after an injury. °· You have been burned. °Summary °· Some injuries that happen during pregnancy can do harm to the baby. °· Tell your doctor about any  injury. °· Take steps to avoid injury. This includes removing rugs and loose objects on the floor. Always wear your seat belt in the car. °· Do not take part in rough and violent activities or sports. °· Get help right away if you have any serious accident or injury. °This information is not intended to replace advice given to you by your health care provider. Make sure you discuss any questions you have with your health care provider. °Document Released: 08/26/2010 Document Revised: 08/02/2016 Document Reviewed: 08/02/2016 °Elsevier Interactive Patient Education © 2019 [employer] ° °

## 2018-10-07 NOTE — MAU Provider Note (Signed)
History     CSN: [id number]  Arrival date and time: 10/07/18 1512   First Provider Initiated Contact with Patient 10/07/18 1556      Chief Complaint  Patient presents with  . Abdominal Pain   HPI  [name] is a 23 y.o. G2P0010 at [redacted]w[redacted]d who presents to MAU today with complaint of being hit in the abdomen by a bagging device at [place] while at work. She states pain is moderate. She denies vaginal bleeding. She has not taken anything for pain.    OB History    Gravida  2   Para      Term      Preterm      AB  1   Living        SAB  1   TAB      Ectopic      Multiple      Live Births              Past Medical History:  Diagnosis Date  . Depression   . Fibroid   . G6PD deficiency   . Supervision of normal first pregnancy, antepartum 02/18/2018    Nursing Staff Provider Office Location  [facility] Dating  LMP/6 week [place]  Language   English  Anatomy US   Flu Vaccine  n/a Genetic Screen  NIPS: low risk, female  AFP:   TDaP vaccine    Hgb A1C or  GTT Early  Third trimester  Rhogam     LAB RESULTS  Feeding Plan Breast Blood Type A/Positive/-- (07/15 0942)  Contraception  Antibody Negative (07/15 0942) Circumcision  Rubella 3.00 (07/15 7673) Pediatricia    Past Surgical History:  Procedure Laterality Date  . NO PAST SURGERIES      Family History  Problem Relation Age of Onset  . Mental illness Mother   . ADD / ADHD Neg Hx   . Anxiety disorder Neg Hx   . Alcohol abuse Neg Hx   . Arthritis Neg Hx   . Asthma Neg Hx   . Cancer Neg Hx   . Birth defects Neg Hx   . Depression Neg Hx   . COPD Neg Hx   . Diabetes Neg Hx   . Drug abuse Neg Hx   . Early death Neg Hx   . Hearing loss Neg Hx   . Heart disease Neg Hx   . Hyperlipidemia Neg Hx   . Hypertension Neg Hx   . Intellectual disability Neg Hx   . Kidney disease Neg Hx   . Learning disabilities Neg Hx   . Miscarriages / Stillbirths Neg Hx   . Stroke Neg Hx   . Obesity Neg Hx   . Vision loss Neg Hx   .  Varicose Veins Neg Hx     Social History   Tobacco Use  . Smoking status: Never Smoker  . Smokeless tobacco: Never Used  Substance Use Topics  . Alcohol use: No    Frequency: Never  . Drug use: No    Allergies:  Allergies  Allergen Reactions  . Blue Dyes (Parenteral) Other (See Comments)    G6PD-- Decreased hemoglobin  . Codeine Other (See Comments)    Decreased hemoglobin  . Other Other (See Comments)    G6PD--Decreased hemoglobin to potato chips (not potatoes in other forms)    Medications Prior to Admission  Medication Sig Dispense Refill Last Dose  . cyclobenzaprine (FLEXERIL) 10 MG tablet Take 1 tablet (10 mg  total) by mouth 3 (three) times daily as needed for muscle spasms. 20 tablet 0   . oseltamivir (TAMIFLU) 75 MG capsule Take 1 capsule (75 mg total) by mouth daily. 10 capsule 0   . phenazopyridine (PYRIDIUM) 95 MG tablet Take two tablets three times a day as needed for urinary discomfort. 12 tablet 0   . Prenatal Vit-Fe Fumarate-FA (PRENATAL MULTIVITAMIN) TABS tablet Take 1 tablet by mouth daily at 12 noon.   10/02/2018 at Unknown time    Review of Systems  Constitutional: Negative for fever.  Gastrointestinal: Positive for abdominal pain. Negative for constipation, diarrhea, nausea and vomiting.  Genitourinary: Negative for vaginal bleeding and vaginal discharge.   Physical Exam   Blood pressure 130/68, pulse 98, resp. rate 16, height 5\' 5"  (1.651 m), weight 93.4 kg, last menstrual period 07/03/2018, SpO2 100 %.  Physical Exam  Nursing note and vitals reviewed. Constitutional: She is oriented to person, place, and time. She appears well-developed and well-nourished. No distress.  HENT:  Head: Normocephalic and atraumatic.  Cardiovascular: Normal rate.  Respiratory: Effort normal.  GI: Soft. She exhibits no distension and no mass. There is abdominal tenderness (mild diffuse). There is no rebound and no guarding.  Neurological: She is alert and oriented to  person, place, and time.  Skin: Skin is warm and dry. No erythema.  Psychiatric: She has a normal mood and affect.    MAU Course  Procedures None  MDM FHR - 155 bpm with doppler   Assessment and Plan  A: SIUP at [redacted]w[redacted]d Abdominal trauma in pregnancy  P:  Discharge home Tylenol PRN for pain recommended  Bleeding/second trimester precautions discussed Patient advised to follow-up with [facility] as scheduled for routine prenatal care Patient may return to MAU as needed or if her condition were to change or worsen  Kerry Hough, PA-C 10/07/2018, 3:56 PM

## 2018-10-11 ENCOUNTER — Other Ambulatory Visit: Payer: Self-pay

## 2018-10-11 ENCOUNTER — Encounter (HOSPITAL_COMMUNITY): Payer: Self-pay

## 2018-10-11 ENCOUNTER — Inpatient Hospital Stay (HOSPITAL_COMMUNITY)
Admission: AD | Admit: 2018-10-11 | Discharge: 2018-10-11 | Disposition: A | Discharge status: Home / Self Care | Payer: Medicaid Other | Attending: Obstetrics & Gynecology | Admitting: Obstetrics & Gynecology

## 2018-10-11 DIAGNOSIS — R10819 Abdominal tenderness, unspecified site: Secondary | ICD-10-CM

## 2018-10-11 DIAGNOSIS — Z3A15 15 weeks gestation of pregnancy: Secondary | ICD-10-CM | POA: Insufficient documentation

## 2018-10-11 DIAGNOSIS — Z3480 Encounter for supervision of other normal pregnancy, unspecified trimester: Secondary | ICD-10-CM

## 2018-10-11 DIAGNOSIS — R102 Pelvic and perineal pain: Secondary | ICD-10-CM | POA: Insufficient documentation

## 2018-10-11 DIAGNOSIS — O26892 Other specified pregnancy related conditions, second trimester: Secondary | ICD-10-CM | POA: Insufficient documentation

## 2018-10-11 LAB — URINALYSIS, ROUTINE W REFLEX MICROSCOPIC
Bilirubin Urine: NEGATIVE
Glucose, UA: NEGATIVE mg/dL
Hgb urine dipstick: NEGATIVE
Ketones, ur: NEGATIVE mg/dL
NITRITE: NEGATIVE
Protein, ur: NEGATIVE mg/dL
Specific Gravity, Urine: 1.019 (ref 1.005–1.030)
pH: 6 (ref 5.0–8.0)

## 2018-10-11 NOTE — MAU Provider Note (Signed)
History    CSN: [id number] Arrival date and time: 10/11/18 4098 First Provider Initiated Contact with Patient 10/11/18 2154    Chief Complaint  Patient presents with  . Abdominal Pain   HPI 23yo G2P0010 at [redacted]w[redacted]d who presents with pelvic cramping. Has been seen in MAU three other times in the last week with similar complaints. History of loss at around 15 weeks and reports lots of anxiety about this pregnancy. Denies any vaginal bleeding, discharge. Has not taking antibiotic for presumed UTI, urine culture with no growth. States pain is worse with movement and pressure. Still having urge to urinate but denies burning with urination. Has been trying to drink some fluids but thinks she could probably drink more. Having irregular bowel movements.   OB History    Gravida  2   Para      Term      Preterm      AB  1   Living        SAB  1   TAB      Ectopic      Multiple      Live Births              Past Medical History:  Diagnosis Date  . Depression   . Fibroid   . G6PD deficiency   . Supervision of normal first pregnancy, antepartum 02/18/2018    Nursing Staff Provider Office Location  [facility] Dating  LMP/6 week [place]  Language   English  Anatomy US   Flu Vaccine  n/a Genetic Screen  NIPS: low risk, female  AFP:   TDaP vaccine    Hgb A1C or  GTT Early  Third trimester  Rhogam     LAB RESULTS  Feeding Plan Breast Blood Type A/Positive/-- (07/15 0942)  Contraception  Antibody Negative (07/15 0942) Circumcision  Rubella 3.00 (07/15 1191) Pediatricia    Past Surgical History:  Procedure Laterality Date  . NO PAST SURGERIES      Family History  Problem Relation Age of Onset  . Mental illness Mother   . ADD / ADHD Neg Hx   . Anxiety disorder Neg Hx   . Alcohol abuse Neg Hx   . Arthritis Neg Hx   . Asthma Neg Hx   . Cancer Neg Hx   . Birth defects Neg Hx   . Depression Neg Hx   . COPD Neg Hx   . Diabetes Neg Hx   . Drug abuse Neg Hx   . Early death Neg Hx   . Hearing  loss Neg Hx   . Heart disease Neg Hx   . Hyperlipidemia Neg Hx   . Hypertension Neg Hx   . Intellectual disability Neg Hx   . Kidney disease Neg Hx   . Learning disabilities Neg Hx   . Miscarriages / Stillbirths Neg Hx   . Stroke Neg Hx   . Obesity Neg Hx   . Vision loss Neg Hx   . Varicose Veins Neg Hx     Social History   Tobacco Use  . Smoking status: Never Smoker  . Smokeless tobacco: Never Used  Substance Use Topics  . Alcohol use: No    Frequency: Never  . Drug use: No    Allergies:  Allergies  Allergen Reactions  . Blue Dyes (Parenteral) Other (See Comments)    G6PD-- Decreased hemoglobin  . Codeine Other (See Comments)    Decreased hemoglobin  . Other Other (See Comments)  G6PD--Decreased hemoglobin to potato chips (not potatoes in other forms)    Medications Prior to Admission  Medication Sig Dispense Refill Last Dose  . cyclobenzaprine (FLEXERIL) 10 MG tablet Take 1 tablet (10 mg total) by mouth 3 (three) times daily as needed for muscle spasms. 20 tablet 0   . oseltamivir (TAMIFLU) 75 MG capsule Take 1 capsule (75 mg total) by mouth daily. 10 capsule 0   . phenazopyridine (PYRIDIUM) 95 MG tablet Take two tablets three times a day as needed for urinary discomfort. 12 tablet 0   . Prenatal Vit-Fe Fumarate-FA (PRENATAL MULTIVITAMIN) TABS tablet Take 1 tablet by mouth daily at 12 noon.   10/02/2018 at Unknown time   Review of Systems  Constitutional: Negative for activity change and appetite change.  Gastrointestinal: Positive for abdominal pain (suprapubic, LLQ) and nausea. Negative for vomiting.  Genitourinary: Positive for pelvic pain. Negative for dysuria, vaginal bleeding, vaginal discharge and vaginal pain.  Musculoskeletal: Negative for back pain.  Neurological: Negative for dizziness and headaches.  Psychiatric/Behavioral: The patient is nervous/anxious.    Physical Exam   Blood pressure 129/78, pulse (!) 115, temperature 98.6 F (37 C), resp.  rate 18, height 5\' 5"  (1.651 m), weight 92 kg, last menstrual period 07/03/2018.  Physical Exam  Nursing note and vitals reviewed. Constitutional: She is oriented to person, place, and time. She appears well-developed and well-nourished. No distress.  HENT:  Head: Normocephalic and atraumatic.  Cardiovascular: Normal rate.  Respiratory: Effort normal. No respiratory distress.  GI: Soft. There is abdominal tenderness (suprapubic, LLQ - distractable).  Genitourinary:    Genitourinary Comments: deferred   Neurological: She is alert and oriented to person, place, and time.  Psychiatric: She has a normal mood and affect. Her behavior is normal.   MAU Course  Procedures  MDM -- BSUS performed - discussed with patient limitations of exam and primarily to confirm FHR since doppler unable to obtain in triage - normal FHR observed, M mode of 173 bpm  -- thought patient was tender on palpation during exam, no tenderness using ultrasound probe  Assessment and Plan  23yo G2P0010 at [redacted]w[redacted]d who presents with cramping but no vaginal bleeding. Fourth visit with similar complaint in the last week or so. BSUS confirmed normal FHR, patient relieved. Reports lots of anxiety related to history of loss. Reviewed return precautions, stable for discharge home. Counseled on importance of staying hydrated. Patient voiced understanding of plan, all questions answered prior to discharge.   Sandra Bow, DO 10/11/2018, 10:08 PM

## 2018-10-11 NOTE — MAU Note (Addendum)
Having lower abd pain and lower back pain since last night. Cramping pain that comes and goes. Denies vag bleeding. Some white vag d/c (Unable to hear FHTs in Triage using doppler. Abdomen tender when trying to hear FHTs)

## 2018-10-11 NOTE — Discharge Instructions (Signed)
·   Pick up prescription for urinary tract infection  Drink plenty of fluids  Okay to use Tylenol for discomfort  Avoid heating pad over the area   Return to MAU if bleeding, worsening cramping/pain, persistent nausea or vomiting

## 2018-10-14 ENCOUNTER — Inpatient Hospital Stay (HOSPITAL_COMMUNITY)
Admission: AD | Admit: 2018-10-14 | Discharge: 2018-10-14 | Disposition: A | Discharge status: Home / Self Care | Payer: Medicaid Other | Attending: Obstetrics and Gynecology | Admitting: Obstetrics and Gynecology

## 2018-10-14 ENCOUNTER — Encounter (HOSPITAL_COMMUNITY): Payer: Self-pay

## 2018-10-14 DIAGNOSIS — F329 Major depressive disorder, single episode, unspecified: Secondary | ICD-10-CM | POA: Insufficient documentation

## 2018-10-14 DIAGNOSIS — Z3A15 15 weeks gestation of pregnancy: Secondary | ICD-10-CM

## 2018-10-14 DIAGNOSIS — Z818 Family history of other mental and behavioral disorders: Secondary | ICD-10-CM | POA: Insufficient documentation

## 2018-10-14 DIAGNOSIS — O219 Vomiting of pregnancy, unspecified: Secondary | ICD-10-CM

## 2018-10-14 DIAGNOSIS — Z91048 Other nonmedicinal substance allergy status: Secondary | ICD-10-CM | POA: Insufficient documentation

## 2018-10-14 DIAGNOSIS — Z91018 Allergy to other foods: Secondary | ICD-10-CM | POA: Insufficient documentation

## 2018-10-14 DIAGNOSIS — O99342 Other mental disorders complicating pregnancy, second trimester: Secondary | ICD-10-CM | POA: Insufficient documentation

## 2018-10-14 DIAGNOSIS — Z885 Allergy status to narcotic agent status: Secondary | ICD-10-CM | POA: Insufficient documentation

## 2018-10-14 LAB — URINALYSIS, ROUTINE W REFLEX MICROSCOPIC
Bilirubin Urine: NEGATIVE
Glucose, UA: NEGATIVE mg/dL
Hgb urine dipstick: NEGATIVE
Ketones, ur: NEGATIVE mg/dL
Leukocytes,Ua: NEGATIVE
Nitrite: NEGATIVE
Protein, ur: NEGATIVE mg/dL
Specific Gravity, Urine: 1.019 (ref 1.005–1.030)
pH: 6 (ref 5.0–8.0)

## 2018-10-14 MED ORDER — ONDANSETRON 4 MG PO TBDP: 4.0000 mg | ORAL_TABLET | Freq: Three times a day (TID) | ORAL | 1 refills | Status: DC | PRN

## 2018-10-14 MED ORDER — ONDANSETRON 4 MG PO TBDP
8.0000 mg | ORAL_TABLET | Freq: Once | ORAL | Status: DC
  Filled 2018-10-14: qty 2

## 2018-10-14 NOTE — Discharge Instructions (Signed)
Morning Sickness    Morning sickness is when you feel sick to your stomach (nauseous) during pregnancy. You may feel sick to your stomach and throw up (vomit). You may feel sick in the morning, but you can feel this way at any time of day. Some women feel very sick to their stomach and cannot stop throwing up (hyperemesis gravidarum).  Follow these instructions at home:  Medicines   Take over-the-counter and prescription medicines only as told by your doctor. Do not take any medicines until you talk with your doctor about them first.   Taking multivitamins before getting pregnant can stop or lessen the harshness of morning sickness.  Eating and drinking   Eat dry toast or crackers before getting out of bed.   Eat 5 or 6 small meals a day.   Eat dry and bland foods like rice and baked potatoes.   Do not eat greasy, fatty, or spicy foods.   Have someone cook for you if the smell of food causes you to feel sick or throw up.   If you feel sick to your stomach after taking prenatal vitamins, take them at night or with a snack.   Eat protein when you need a snack. Nuts, yogurt, and cheese are good choices.   Drink fluids throughout the day.   Try ginger ale made with real ginger, ginger tea made from fresh grated ginger, or ginger candies.  General instructions   Do not use any products that have nicotine or tobacco in them, such as cigarettes and e-cigarettes. If you need help quitting, ask your doctor.   Use an air purifier to keep the air in your house free of smells.   Get lots of fresh air.   Try to avoid smells that make you feel sick.   Try:  ? Wearing a bracelet that is used for seasickness (acupressure wristband).  ? Going to a doctor who puts thin needles into certain body points (acupuncture) to improve how you feel.  Contact a doctor if:   You need medicine to feel better.   You feel dizzy or light-headed.   You are losing weight.  Get help right away if:   You feel very sick to your  stomach and cannot stop throwing up.   You pass out (faint).   You have very bad pain in your belly.  Summary   Morning sickness is when you feel sick to your stomach (nauseous) during pregnancy.   You may feel sick in the morning, but you can feel this way at any time of day.   Making some changes to what you eat may help your symptoms go away.  This information is not intended to replace advice given to you by your health care provider. Make sure you discuss any questions you have with your health care provider.  Document Released: 08/31/2004 Document Revised: 08/24/2016 Document Reviewed: 08/24/2016  Elsevier Interactive Patient Education  2019 [employer]

## 2018-10-14 NOTE — MAU Provider Note (Signed)
History     CSN: [id number]  Arrival date and time: 10/14/18 1946   First Provider Initiated Contact with Patient 10/14/18 2111      Chief Complaint  Patient presents with  . Emesis   Sandra Macdonald is a 23 y.o. G2P0 at [redacted]w[redacted]d who presents to MAU with complaints of nausea and vomiting. She reports symptoms started occurring today, reports vomiting 4 times today. She denies having medication at home for N/V. Able to keep liquids down but unable to keep food down. Denies abdominal pain, vaginal bleeding or discharge.    OB History    Gravida  2   Para      Term      Preterm      AB  1   Living        SAB  1   TAB      Ectopic      Multiple      Live Births              Past Medical History:  Diagnosis Date  . Depression   . Fibroid   . G6PD deficiency   . Supervision of normal first pregnancy, antepartum 02/18/2018    Nursing Staff Provider Office Location  [facility] Dating  LMP/6 week [place]  Language   English  Anatomy US   Flu Vaccine  n/a Genetic Screen  NIPS: low risk, female  AFP:   TDaP vaccine    Hgb A1C or  GTT Early  Third trimester  Rhogam     LAB RESULTS  Feeding Plan Breast Blood Type A/Positive/-- (07/15 0942)  Contraception  Antibody Negative (07/15 0942) Circumcision  Rubella 3.00 (07/15 4098) Pediatricia    Past Surgical History:  Procedure Laterality Date  . NO PAST SURGERIES      Family History  Problem Relation Age of Onset  . Mental illness Mother   . ADD / ADHD Neg Hx   . Anxiety disorder Neg Hx   . Alcohol abuse Neg Hx   . Arthritis Neg Hx   . Asthma Neg Hx   . Cancer Neg Hx   . Birth defects Neg Hx   . Depression Neg Hx   . COPD Neg Hx   . Diabetes Neg Hx   . Drug abuse Neg Hx   . Early death Neg Hx   . Hearing loss Neg Hx   . Heart disease Neg Hx   . Hyperlipidemia Neg Hx   . Hypertension Neg Hx   . Intellectual disability Neg Hx   . Kidney disease Neg Hx   . Learning disabilities Neg Hx   . Miscarriages / Stillbirths Neg Hx   .  Stroke Neg Hx   . Obesity Neg Hx   . Vision loss Neg Hx   . Varicose Veins Neg Hx     Social History   Tobacco Use  . Smoking status: Never Smoker  . Smokeless tobacco: Never Used  Substance Use Topics  . Alcohol use: No    Frequency: Never  . Drug use: No    Allergies:  Allergies  Allergen Reactions  . Blue Dyes (Parenteral) Other (See Comments)    G6PD-- Decreased hemoglobin  . Codeine Other (See Comments)    Decreased hemoglobin  . Other Other (See Comments)    G6PD--Decreased hemoglobin to potato chips (not potatoes in other forms)    Medications Prior to Admission  Medication Sig Dispense Refill Last Dose  . cyclobenzaprine (FLEXERIL) 10 MG tablet  Take 1 tablet (10 mg total) by mouth 3 (three) times daily as needed for muscle spasms. 20 tablet 0   . phenazopyridine (PYRIDIUM) 95 MG tablet Take two tablets three times a day as needed for urinary discomfort. 12 tablet 0   . Prenatal Vit-Fe Fumarate-FA (PRENATAL MULTIVITAMIN) TABS tablet Take 1 tablet by mouth daily at 12 noon.   10/02/2018 at Unknown time    Review of Systems  Constitutional: Negative.   Respiratory: Negative.   Cardiovascular: Negative.   Gastrointestinal: Positive for nausea and vomiting. Negative for abdominal pain, constipation and diarrhea.  Genitourinary: Negative.   Neurological: Negative.    Physical Exam   Blood pressure 131/75, pulse 92, temperature 98.6 F (37 C), temperature source Oral, resp. rate 16, height 5\' 5"  (1.651 m), weight 92.4 kg, last menstrual period 07/03/2018, SpO2 100 %.  Physical Exam  Nursing note and vitals reviewed. Constitutional: She is oriented to person, place, and time. She appears well-developed and well-nourished. No distress.  Cardiovascular: Normal rate, regular rhythm and normal heart sounds.  Respiratory: Effort normal and breath sounds normal. No respiratory distress. She has no wheezes. She has no rales.  GI: Soft. Bowel sounds are normal. She  exhibits no distension. There is no abdominal tenderness. There is no rebound and no guarding.  Neurological: She is alert and oriented to person, place, and time.  Psychiatric: She has a normal mood and affect. Her behavior is normal. Thought content normal.   FHR 155 by doppler   MAU Course  Procedures  MDM Zofran ODT  PO challenge   Able to tolerate liquids and crackers prior to discharge home. Follow up as scheduled for prenatal appointment on 3/13, return to MAU as needed for reasons discussed. Pt stable at time of discharge   Assessment and Plan   1. Nausea and vomiting during pregnancy   2. [redacted] weeks gestation of pregnancy    Discharge home Follow up as scheduled  Rx for Zofran  Return to MAU as needed   Follow-up Information    [facility].   Specialty:  Obstetrics and Gynecology Why:  Follow up as scheduled for initial prenatal appointment  Contact information: [place] [phone]         Allergies as of 10/14/2018      Reactions   Blue Dyes (parenteral) Other (See Comments)   G6PD-- Decreased hemoglobin   Codeine Other (See Comments)   Decreased hemoglobin   Other Other (See Comments)   G6PD--Decreased hemoglobin to potato chips (not potatoes in other forms)      Medication List    STOP taking these medications   cyclobenzaprine 10 MG tablet Commonly known as:  FLEXERIL   phenazopyridine 95 MG tablet Commonly known as:  PYRIDIUM     TAKE these medications   ondansetron 4 MG disintegrating tablet Commonly known as:  Zofran ODT Take 1 tablet (4 mg total) by mouth every 8 (eight) hours as needed for nausea or vomiting.   prenatal multivitamin Tabs tablet Take 1 tablet by mouth daily at 12 noon.      Lajean Manes CNM 10/14/2018, 10:09 PM

## 2018-10-14 NOTE — MAU Note (Addendum)
Pt here for nausea and vomiting that started this morning. Reports she has vomited at least 4 times. States she can keep liquids in but cannot keep any food down. This is a new occurrence. Denies pain, diarrhea, fever or sick contacts.

## 2018-10-16 ENCOUNTER — Inpatient Hospital Stay (HOSPITAL_COMMUNITY)
Admission: AD | Admit: 2018-10-16 | Discharge: 2018-10-17 | Disposition: A | Discharge status: Home / Self Care | Payer: Medicaid Other | Attending: Obstetrics and Gynecology | Admitting: Obstetrics and Gynecology

## 2018-10-16 ENCOUNTER — Encounter (HOSPITAL_COMMUNITY): Payer: Self-pay

## 2018-10-16 ENCOUNTER — Other Ambulatory Visit: Payer: Self-pay

## 2018-10-16 DIAGNOSIS — R102 Pelvic and perineal pain: Secondary | ICD-10-CM | POA: Insufficient documentation

## 2018-10-16 DIAGNOSIS — Z3480 Encounter for supervision of other normal pregnancy, unspecified trimester: Secondary | ICD-10-CM

## 2018-10-16 DIAGNOSIS — Z3A15 15 weeks gestation of pregnancy: Secondary | ICD-10-CM | POA: Insufficient documentation

## 2018-10-16 DIAGNOSIS — O26892 Other specified pregnancy related conditions, second trimester: Secondary | ICD-10-CM | POA: Insufficient documentation

## 2018-10-16 LAB — WET PREP, GENITAL
Clue Cells Wet Prep HPF POC: NONE SEEN
Trich, Wet Prep: NONE SEEN
YEAST WET PREP: NONE SEEN

## 2018-10-16 LAB — URINALYSIS, ROUTINE W REFLEX MICROSCOPIC
Bilirubin Urine: NEGATIVE
Glucose, UA: 50 mg/dL — AB
Hgb urine dipstick: NEGATIVE
Ketones, ur: NEGATIVE mg/dL
LEUKOCYTE UA: NEGATIVE
NITRITE: NEGATIVE
Protein, ur: NEGATIVE mg/dL
Specific Gravity, Urine: 1.026 (ref 1.005–1.030)
pH: 5 (ref 5.0–8.0)

## 2018-10-16 NOTE — MAU Note (Signed)
Pt reporting pain her vagina since earlier today. This is a new problem. Denies LOF or bleeding. HX of miscarriage.  Has had multiple MAU visits as she is anxious about this pregnancy.

## 2018-10-16 NOTE — MAU Provider Note (Addendum)
History     CSN: [id number]  Arrival date and time: 10/16/18 2144   First Provider Initiated Contact with Patient 10/16/18 2310      Chief Complaint  Patient presents with  . Vaginal Pain   G2P0010 @15 .5 wks presenting with vaginal pain/pressure. Sx started earlier today and became worse. Denies VB. Reports "normal" vaginal discharge. Had IC prior to sx. Denies and pain or cramping.   OB History    Gravida  2   Para      Term      Preterm      AB  1   Living        SAB  1   TAB      Ectopic      Multiple      Live Births              Past Medical History:  Diagnosis Date  . Depression   . Fibroid   . G6PD deficiency   . Supervision of normal first pregnancy, antepartum 02/18/2018    Nursing Staff Provider Office Location  [facility] Dating  LMP/6 week [place]  Language   English  Anatomy US   Flu Vaccine  n/a Genetic Screen  NIPS: low risk, female  AFP:   TDaP vaccine    Hgb A1C or  GTT Early  Third trimester  Rhogam     LAB RESULTS  Feeding Plan Breast Blood Type A/Positive/-- (07/15 0942)  Contraception  Antibody Negative (07/15 0942) Circumcision  Rubella 3.00 (07/15 2229) Pediatricia    Past Surgical History:  Procedure Laterality Date  . NO PAST SURGERIES      Family History  Problem Relation Age of Onset  . Mental illness Mother   . ADD / ADHD Neg Hx   . Anxiety disorder Neg Hx   . Alcohol abuse Neg Hx   . Arthritis Neg Hx   . Asthma Neg Hx   . Cancer Neg Hx   . Birth defects Neg Hx   . Depression Neg Hx   . COPD Neg Hx   . Diabetes Neg Hx   . Drug abuse Neg Hx   . Early death Neg Hx   . Hearing loss Neg Hx   . Heart disease Neg Hx   . Hyperlipidemia Neg Hx   . Hypertension Neg Hx   . Intellectual disability Neg Hx   . Kidney disease Neg Hx   . Learning disabilities Neg Hx   . Miscarriages / Stillbirths Neg Hx   . Stroke Neg Hx   . Obesity Neg Hx   . Vision loss Neg Hx   . Varicose Veins Neg Hx     Social History   Tobacco Use  .  Smoking status: Never Smoker  . Smokeless tobacco: Never Used  Substance Use Topics  . Alcohol use: No    Frequency: Never  . Drug use: No    Allergies:  Allergies  Allergen Reactions  . Blue Dyes (Parenteral) Other (See Comments)    G6PD-- Decreased hemoglobin  . Codeine Other (See Comments)    Decreased hemoglobin  . Other Other (See Comments)    G6PD--Decreased hemoglobin to potato chips (not potatoes in other forms)    Medications Prior to Admission  Medication Sig Dispense Refill Last Dose  . Prenatal Vit-Fe Fumarate-FA (PRENATAL MULTIVITAMIN) TABS tablet Take 1 tablet by mouth daily at 12 noon.   10/16/2018 at Unknown time  . ondansetron (ZOFRAN ODT) 4 MG disintegrating tablet  Take 1 tablet (4 mg total) by mouth every 8 (eight) hours as needed for nausea or vomiting. 30 tablet 1  at not taking    Review of Systems  Gastrointestinal: Negative for abdominal pain.  Genitourinary: Positive for vaginal pain. Negative for vaginal bleeding and vaginal discharge.   Physical Exam   Blood pressure 135/70, pulse 95, resp. rate 16, height 5\' 5"  (1.651 m), weight 94.4 kg, last menstrual period 07/03/2018, SpO2 99 %.  Physical Exam  Constitutional: She is oriented to person, place, and time. She appears well-developed and well-nourished. No distress.  HENT:  Head: Normocephalic and atraumatic.  Neck: Normal range of motion.  Cardiovascular: Normal rate.  Respiratory: Effort normal. No respiratory distress.  GI: Soft. She exhibits no distension. There is no abdominal tenderness.  Genitourinary:    Genitourinary Comments: External: no lesions or erythema Vagina: rugated, pink, moist, thick white discharge, no blood Cervix closed/long    Musculoskeletal: Normal range of motion.  Neurological: She is alert and oriented to person, place, and time.  Skin: Skin is warm and dry.  Psychiatric: She has a normal mood and affect.  FHT 156  Results for orders placed or performed during  the hospital encounter of 10/16/18 (from the past 24 hour(s))  Urinalysis, Routine w reflex microscopic     Status: Abnormal   Collection Time: 10/16/18 10:24 PM  Result Value Ref Range   Color, Urine YELLOW YELLOW   APPearance CLEAR CLEAR   Specific Gravity, Urine 1.026 1.005 - 1.030   pH 5.0 5.0 - 8.0   Glucose, UA 50 (A) NEGATIVE mg/dL   Hgb urine dipstick NEGATIVE NEGATIVE   Bilirubin Urine NEGATIVE NEGATIVE   Ketones, ur NEGATIVE NEGATIVE mg/dL   Protein, ur NEGATIVE NEGATIVE mg/dL   Nitrite NEGATIVE NEGATIVE   Leukocytes,Ua NEGATIVE NEGATIVE   MAU Course  Procedures Orders Placed This Encounter  Procedures  . Wet prep, genital    Standing Status:   Standing    Number of Occurrences:   1    Order Specific Question:   Patient immune status    Answer:   Normal  . Urinalysis, Routine w reflex microscopic    Standing Status:   Standing    Number of Occurrences:   1   MDM Labs ordered and reviewed. No evidence of threatened SAB. No evidence of UTI or vaginal infection. GC recently negative, not repeated. Pt reassured. Stable for discharge home.  Assessment and Plan   1. [redacted] weeks gestation of pregnancy   2. Supervision of other normal pregnancy, antepartum   3. Vaginal pain    Discharge home Follow up at [facility] as scheduled this week SAB precautions  Allergies as of 10/16/2018      Reactions   Blue Dyes (parenteral) Other (See Comments)   G6PD-- Decreased hemoglobin   Codeine Other (See Comments)   Decreased hemoglobin   Other Other (See Comments)   G6PD--Decreased hemoglobin to potato chips (not potatoes in other forms)      Medication List    TAKE these medications   ondansetron 4 MG disintegrating tablet Commonly known as:  Zofran ODT Take 1 tablet (4 mg total) by mouth every 8 (eight) hours as needed for nausea or vomiting.   prenatal multivitamin Tabs tablet Take 1 tablet by mouth daily at 12 noon.      Julianne Handler, CNM 10/16/2018, 11:16 PM

## 2018-10-16 NOTE — Discharge Instructions (Signed)

## 2018-10-18 ENCOUNTER — Telehealth: Payer: Self-pay | Admitting: General Practice

## 2018-10-18 ENCOUNTER — Inpatient Hospital Stay (HOSPITAL_COMMUNITY)
Admission: AD | Admit: 2018-10-18 | Discharge: 2018-10-18 | Disposition: A | Discharge status: Home / Self Care | Payer: Medicaid Other | Attending: Obstetrics and Gynecology | Admitting: Obstetrics and Gynecology

## 2018-10-18 ENCOUNTER — Encounter: Payer: Self-pay | Admitting: Student

## 2018-10-18 ENCOUNTER — Inpatient Hospital Stay (HOSPITAL_BASED_OUTPATIENT_CLINIC_OR_DEPARTMENT_OTHER): Payer: Medicaid Other

## 2018-10-18 ENCOUNTER — Other Ambulatory Visit: Payer: Self-pay

## 2018-10-18 ENCOUNTER — Encounter (HOSPITAL_COMMUNITY): Payer: Self-pay | Admitting: *Deleted

## 2018-10-18 DIAGNOSIS — R1030 Lower abdominal pain, unspecified: Secondary | ICD-10-CM | POA: Insufficient documentation

## 2018-10-18 DIAGNOSIS — R109 Unspecified abdominal pain: Secondary | ICD-10-CM

## 2018-10-18 DIAGNOSIS — Z3686 Encounter for antenatal screening for cervical length: Secondary | ICD-10-CM

## 2018-10-18 DIAGNOSIS — O26899 Other specified pregnancy related conditions, unspecified trimester: Secondary | ICD-10-CM

## 2018-10-18 DIAGNOSIS — O26892 Other specified pregnancy related conditions, second trimester: Secondary | ICD-10-CM | POA: Insufficient documentation

## 2018-10-18 DIAGNOSIS — O9989 Other specified diseases and conditions complicating pregnancy, childbirth and the puerperium: Secondary | ICD-10-CM

## 2018-10-18 DIAGNOSIS — Z3A16 16 weeks gestation of pregnancy: Secondary | ICD-10-CM | POA: Insufficient documentation

## 2018-10-18 DIAGNOSIS — Z711 Person with feared health complaint in whom no diagnosis is made: Secondary | ICD-10-CM

## 2018-10-18 NOTE — Discharge Instructions (Signed)
Abdominal Pain During Pregnancy ° °Abdominal pain is common during pregnancy, and has many possible causes. Some causes are more serious than others, and sometimes the cause is not known. Abdominal pain can be a sign that labor is starting. It can also be caused by normal growth and stretching of muscles and ligaments during pregnancy. Always tell your health care provider if you have any abdominal pain. °Follow these instructions at home: °· Do not have sex or put anything in your vagina until your pain goes away completely. °· Get plenty of rest until your pain improves. °· Drink enough fluid to keep your urine pale yellow. °· Take over-the-counter and prescription medicines only as told by your health care provider. °· Keep all follow-up visits as told by your health care provider. This is important. °Contact a health care provider if: °· Your pain continues or gets worse after resting. °· You have lower abdominal pain that: °? Comes and goes at regular intervals. °? Spreads to your back. °? Is similar to menstrual cramps. °· You have pain or burning when you urinate. °Get help right away if: °· You have a fever or chills. °· You have vaginal bleeding. °· You are leaking fluid from your vagina. °· You are passing tissue from your vagina. °· You have vomiting or diarrhea that lasts for more than 24 hours. °· Your baby is moving less than usual. °· You feel very weak or faint. °· You have shortness of breath. °· You develop severe pain in your upper abdomen. °Summary °· Abdominal pain is common during pregnancy, and has many possible causes. °· If you experience abdominal pain during pregnancy, tell your health care provider right away. °· Follow your health care provider's home care instructions and keep all follow-up visits as directed. °This information is not intended to replace advice given to you by your health care provider. Make sure you discuss any questions you have with your health care  provider. °Document Released: 07/24/2005 Document Revised: 10/26/2016 Document Reviewed: 10/26/2016 °Elsevier Interactive Patient Education © 2019 [employer] ° °

## 2018-10-18 NOTE — MAU Note (Signed)
Pt states her nephew "jumped on her stomach" at approximately 1500 today.  Is having lower abdominal pain, denies bleeding.

## 2018-10-18 NOTE — Telephone Encounter (Signed)
Patient canceled via automated reminder system (Automated Cancel)

## 2018-10-18 NOTE — MAU Provider Note (Signed)
History     CSN: [id number]  Arrival date and time: 10/18/18 1521   First Provider Initiated Contact with Patient 10/18/18 1704      Chief Complaint  Patient presents with  . Abdominal Pain   HPI   [name] is a 23 y.o.female G2P0010 @ [redacted]w[redacted]d here with lower abdominal cramping. She said the pain started when she found out she was pregnant. She had a previous 15 week loss due to ROM. She says the pain is in her lower abdomen and feels like cramping. She took tylenol around 1200 noon which did not help. No bleeding. + fetal movement.  Intercourse was this morning.   Multiple MAU visits this pregnancy. She missed her regular scheduled OB appointment today.   OB History    Gravida  2   Para      Term      Preterm      AB  1   Living        SAB  1   TAB      Ectopic      Multiple      Live Births              Past Medical History:  Diagnosis Date  . Depression   . Fibroid   . G6PD deficiency   . Supervision of normal first pregnancy, antepartum 02/18/2018    Nursing Staff Provider Office Location  [facility] Dating  LMP/6 week [place]  Language   English  Anatomy US   Flu Vaccine  n/a Genetic Screen  NIPS: low risk, female  AFP:   TDaP vaccine    Hgb A1C or  GTT Early  Third trimester  Rhogam     LAB RESULTS  Feeding Plan Breast Blood Type A/Positive/-- (07/15 0942)  Contraception  Antibody Negative (07/15 0942) Circumcision  Rubella 3.00 (07/15 2800) Pediatricia    Past Surgical History:  Procedure Laterality Date  . NO PAST SURGERIES      Family History  Problem Relation Age of Onset  . Mental illness Mother   . ADD / ADHD Neg Hx   . Anxiety disorder Neg Hx   . Alcohol abuse Neg Hx   . Arthritis Neg Hx   . Asthma Neg Hx   . Cancer Neg Hx   . Birth defects Neg Hx   . Depression Neg Hx   . COPD Neg Hx   . Diabetes Neg Hx   . Drug abuse Neg Hx   . Early death Neg Hx   . Hearing loss Neg Hx   . Heart disease Neg Hx   . Hyperlipidemia Neg Hx   .  Hypertension Neg Hx   . Intellectual disability Neg Hx   . Kidney disease Neg Hx   . Learning disabilities Neg Hx   . Miscarriages / Stillbirths Neg Hx   . Stroke Neg Hx   . Obesity Neg Hx   . Vision loss Neg Hx   . Varicose Veins Neg Hx     Social History   Tobacco Use  . Smoking status: Never Smoker  . Smokeless tobacco: Never Used  Substance Use Topics  . Alcohol use: No    Frequency: Never  . Drug use: No    Allergies:  Allergies  Allergen Reactions  . Blue Dyes (Parenteral) Other (See Comments)    G6PD-- Decreased hemoglobin  . Codeine Other (See Comments)    Decreased hemoglobin  . Other Other (See Comments)  G6PD--Decreased hemoglobin to potato chips (not potatoes in other forms)    Medications Prior to Admission  Medication Sig Dispense Refill Last Dose  . ondansetron (ZOFRAN ODT) 4 MG disintegrating tablet Take 1 tablet (4 mg total) by mouth every 8 (eight) hours as needed for nausea or vomiting. 30 tablet 1 10/17/2018 at Unknown time  . Prenatal Vit-Fe Fumarate-FA (PRENATAL MULTIVITAMIN) TABS tablet Take 1 tablet by mouth daily at 12 noon.   10/17/2018 at Unknown time   Review of Systems  Constitutional: Negative for fever.  Gastrointestinal: Positive for abdominal pain. Negative for nausea and vomiting.  Genitourinary: Positive for pelvic pain. Negative for vaginal bleeding and vaginal discharge.   Physical Exam   Blood pressure 128/66, pulse (!) 108, temperature 98.5 F (36.9 C), temperature source Oral, resp. rate 16, weight 93.8 kg, last menstrual period 07/03/2018.  Physical Exam  Constitutional: She is oriented to person, place, and time. She appears well-developed and well-nourished. No distress.  HENT:  Head: Normocephalic.  Respiratory: Effort normal.  GI: Soft. She exhibits no distension. There is no abdominal tenderness. There is no rebound and no guarding.  Genitourinary:    Genitourinary Comments: Bimanual: Closed, thick, posterior     Musculoskeletal: Normal range of motion.  Neurological: She is alert and oriented to person, place, and time.  Skin: Skin is warm. She is not diaphoretic.  Psychiatric: Her behavior is normal.   MAU Course  Procedures  None   MDM  UA normal 2 days ago Patient unable to urinate today in MAU for a UA.  Cervical length 2.9 today. Patient reassured.    Assessment and Plan   A:  1. Abdominal pain during pregnancy, antepartum   2. Encounter for antenatal screening for cervical length   3. Physically well but worried     P:  Discharge home in stable condition Return to MAU If symptoms worsen  Message sent to the [facility] to reschedule OB appointment that she missed today.     Lezlie Lye, NP 10/18/2018 6:52 PM

## 2018-10-20 ENCOUNTER — Other Ambulatory Visit: Payer: Self-pay

## 2018-10-20 ENCOUNTER — Inpatient Hospital Stay (HOSPITAL_COMMUNITY)
Admission: AD | Admit: 2018-10-20 | Discharge: 2018-10-21 | DRG: 779 | Disposition: A | Discharge status: Home / Self Care | Payer: Medicaid Other | Attending: Obstetrics & Gynecology | Admitting: Obstetrics & Gynecology

## 2018-10-20 ENCOUNTER — Inpatient Hospital Stay (HOSPITAL_COMMUNITY): Payer: Medicaid Other

## 2018-10-20 ENCOUNTER — Encounter (HOSPITAL_COMMUNITY): Payer: Self-pay

## 2018-10-20 DIAGNOSIS — Z3A16 16 weeks gestation of pregnancy: Secondary | ICD-10-CM

## 2018-10-20 DIAGNOSIS — Z3480 Encounter for supervision of other normal pregnancy, unspecified trimester: Secondary | ICD-10-CM

## 2018-10-20 DIAGNOSIS — O039 Complete or unspecified spontaneous abortion without complication: Principal | ICD-10-CM | POA: Diagnosis present

## 2018-10-20 DIAGNOSIS — R109 Unspecified abdominal pain: Secondary | ICD-10-CM

## 2018-10-20 DIAGNOSIS — N883 Incompetence of cervix uteri: Secondary | ICD-10-CM

## 2018-10-20 DIAGNOSIS — O3432 Maternal care for cervical incompetence, second trimester: Secondary | ICD-10-CM | POA: Diagnosis present

## 2018-10-20 DIAGNOSIS — O26892 Other specified pregnancy related conditions, second trimester: Secondary | ICD-10-CM

## 2018-10-20 DIAGNOSIS — D550 Anemia due to glucose-6-phosphate dehydrogenase [G6PD] deficiency: Secondary | ICD-10-CM | POA: Diagnosis present

## 2018-10-20 DIAGNOSIS — O209 Hemorrhage in early pregnancy, unspecified: Secondary | ICD-10-CM | POA: Diagnosis present

## 2018-10-20 LAB — TYPE AND SCREEN
ABO/RH(D): A POS
Antibody Screen: NEGATIVE

## 2018-10-20 LAB — CBC
HEMATOCRIT: 38.4 % (ref 36.0–46.0)
Hemoglobin: 11.8 g/dL — ABNORMAL LOW (ref 12.0–15.0)
MCH: 27.8 pg (ref 26.0–34.0)
MCHC: 30.7 g/dL (ref 30.0–36.0)
MCV: 90.6 fL (ref 80.0–100.0)
Platelets: 324 10*3/uL (ref 150–400)
RBC: 4.24 MIL/uL (ref 3.87–5.11)
RDW: 13.1 % (ref 11.5–15.5)
WBC: 8.8 10*3/uL (ref 4.0–10.5)
nRBC: 0 % (ref 0.0–0.2)

## 2018-10-20 MED ORDER — MORPHINE SULFATE (PF) 4 MG/ML IV SOLN
4.0000 mg | Freq: Once | INTRAVENOUS | Status: AC
  Administered 2018-10-20: 4 mg via INTRAVENOUS
  Filled 2018-10-20: qty 1

## 2018-10-20 MED ORDER — DOCUSATE SODIUM 100 MG PO CAPS
100.0000 mg | ORAL_CAPSULE | Freq: Every day | ORAL | Status: DC
  Administered 2018-10-21: 100 mg via ORAL
  Filled 2018-10-20: qty 1

## 2018-10-20 MED ORDER — CALCIUM CARBONATE ANTACID 500 MG PO CHEW: 2.0000 | CHEWABLE_TABLET | ORAL | Status: DC | PRN

## 2018-10-20 MED ORDER — PRENATAL MULTIVITAMIN CH: 1.0000 | ORAL_TABLET | Freq: Every day | ORAL | Status: DC

## 2018-10-20 MED ORDER — LACTATED RINGERS IV SOLN
INTRAVENOUS | Status: DC
  Administered 2018-10-20 – 2018-10-21 (×4): via INTRAVENOUS

## 2018-10-20 MED ORDER — ZOLPIDEM TARTRATE 5 MG PO TABS: 5.0000 mg | ORAL_TABLET | Freq: Every evening | ORAL | Status: DC | PRN

## 2018-10-20 MED ORDER — FENTANYL CITRATE (PF) 100 MCG/2ML IJ SOLN
100.0000 ug | Freq: Once | INTRAMUSCULAR | Status: AC
  Administered 2018-10-21: 100 ug via INTRAVENOUS
  Filled 2018-10-20: qty 2

## 2018-10-20 MED ORDER — ACETAMINOPHEN 325 MG PO TABS
650.0000 mg | ORAL_TABLET | ORAL | Status: DC | PRN
  Administered 2018-10-20 – 2018-10-21 (×2): 650 mg via ORAL
  Filled 2018-10-20 (×2): qty 2

## 2018-10-20 NOTE — MAU Provider Note (Signed)
Chief Complaint: Abdominal Pain and Vaginal Bleeding   First Provider Initiated Contact with Patient 10/20/18 1143     SUBJECTIVE HPI: Sandra Macdonald is a 23 y.o. G2P0010 at [redacted]w[redacted]d who presents to Maternity Admissions reporting abdominal cramping & vaginal bleeding. Symptoms started today. Reports bright red blood on toilet paper. Not saturating pads or passing clots. No recent intercourse.  Hx of 15 wk loss in 03/2018. Has not had prenatal care yet with this pregnancy. Was in MAU on Friday for abdominal pain; had closed cervix & TVUS with CL of 2.9 cm.   Location: abdomen Quality: cramping Severity: 10/10 on pain scale Duration: 1 day Timing: intermittent Modifying factors: none Associated signs and symptoms: vaginal bleeding  Past Medical History:  Diagnosis Date  . Depression   . Fibroid   . G6PD deficiency   . Supervision of normal first pregnancy, antepartum 02/18/2018    Nursing Staff Provider Office Location  [facility] Dating  LMP/6 week [place]  Language   English  Anatomy US   Flu Vaccine  n/a Genetic Screen  NIPS: low risk, female  AFP:   TDaP vaccine    Hgb A1C or  GTT Early  Third trimester  Rhogam     LAB RESULTS  Feeding Plan Breast Blood Type A/Positive/-- (07/15 0942)  Contraception  Antibody Negative (07/15 0942) Circumcision  Rubella 3.00 (07/15 0942) Pediatricia   OB History  Gravida Para Term Preterm AB Living  2       1    SAB TAB Ectopic Multiple Live Births  1            # Outcome Date GA Lbr Len/2nd Weight Sex Delivery Anes PTL Lv  2 Current           1 SAB 03/20/18 [redacted]w[redacted]d          Past Surgical History:  Procedure Laterality Date  . NO PAST SURGERIES     Social History   Socioeconomic History  . Marital status: Single    Spouse name: Not on file  . Number of children: Not on file  . Years of education: Not on file  . Highest education level: 10th grade  Occupational History  . Not on file  Social Needs  . Financial resource strain: Not very hard  . Food  insecurity:    Worry: Never true    Inability: Never true  . Transportation needs:    Medical: No    Non-medical: No  Tobacco Use  . Smoking status: Never Smoker  . Smokeless tobacco: Never Used  Substance and Sexual Activity  . Alcohol use: No    Frequency: Never  . Drug use: No  . Sexual activity: Not Currently    Birth control/protection: None  Lifestyle  . Physical activity:    Days per week: 0 days    Minutes per session: 0 min  . Stress: Only a little  Relationships  . Social connections:    Talks on phone: Never    Gets together: Never    Attends religious service: More than 4 times per year    Active member of club or organization: No    Attends meetings of clubs or organizations: Never    Relationship status: Never married  . Intimate partner violence:    Fear of current or ex partner: No    Emotionally abused: No    Physically abused: No    Forced sexual activity: No  Other Topics Concern  . Not on file  Social History Narrative  . Not on file   Family History  Problem Relation Age of Onset  . Mental illness Mother   . ADD / ADHD Neg Hx   . Anxiety disorder Neg Hx   . Alcohol abuse Neg Hx   . Arthritis Neg Hx   . Asthma Neg Hx   . Cancer Neg Hx   . Birth defects Neg Hx   . Depression Neg Hx   . COPD Neg Hx   . Diabetes Neg Hx   . Drug abuse Neg Hx   . Early death Neg Hx   . Hearing loss Neg Hx   . Heart disease Neg Hx   . Hyperlipidemia Neg Hx   . Hypertension Neg Hx   . Intellectual disability Neg Hx   . Kidney disease Neg Hx   . Learning disabilities Neg Hx   . Miscarriages / Stillbirths Neg Hx   . Stroke Neg Hx   . Obesity Neg Hx   . Vision loss Neg Hx   . Varicose Veins Neg Hx    No current facility-administered medications on file prior to encounter.    Current Outpatient Medications on File Prior to Encounter  Medication Sig Dispense Refill  . ondansetron (ZOFRAN ODT) 4 MG disintegrating tablet Take 1 tablet (4 mg total) by mouth  every 8 (eight) hours as needed for nausea or vomiting. 30 tablet 1  . Prenatal Vit-Fe Fumarate-FA (PRENATAL MULTIVITAMIN) TABS tablet Take 1 tablet by mouth daily at 12 noon.     Allergies  Allergen Reactions  . Blue Dyes (Parenteral) Other (See Comments)    G6PD-- Decreased hemoglobin  . Codeine Other (See Comments)    Decreased hemoglobin  . Other Other (See Comments)    G6PD--Decreased hemoglobin to potato chips (not potatoes in other forms)    I have reviewed patient's Past Medical Hx, Surgical Hx, Family Hx, Social Hx, medications and allergies.   Review of Systems  Constitutional: Negative.   Gastrointestinal: Positive for abdominal pain.  Genitourinary: Positive for vaginal bleeding.    OBJECTIVE Patient Vitals for the past 24 hrs:  BP Temp Temp src Pulse Resp SpO2 Height Weight  10/20/18 1137 137/79 98.1 F (36.7 C) Oral (!) 126 20 100 % - -  10/20/18 1130 - - - - - - 5\' 5"  (1.651 m) 92.5 kg   Constitutional: Well-developed, well-nourished female in no acute distress.  Cardiovascular: normal rate & rhythm, no murmur Respiratory: normal rate and effort. Lung sounds clear throughout GI: Abd soft, non-tender, Pos BS x 4. No guarding or rebound tenderness MS: Extremities nontender, no edema, normal ROM Neurologic: Alert and oriented x 4.  GU:     SPECULUM EXAM: bright red blood staining vulva. Bulging membranes into vagina  BIMANUAL: gentle digital exam performed, could not feel cervix  LAB RESULTS No results found for this or any previous visit (from the past 24 hour(s)).  IMAGING No results found.  MAU COURSE Orders Placed This Encounter  Procedures  . [place] MFM OB LIMITED  . Urinalysis, Routine w reflex microscopic  . CBC on admission  . Trendelenberg Position  . Type and screen [place]   Meds ordered this encounter  Medications  . lactated ringers infusion  . morphine 4 MG/ML injection 4 mg    MDM FHT present via  doppler  Discussed results of exam with the patient.   C/w Dr. Elonda Husky. Will obtain bedside ultrasound to evaluate cervix.  Ultrasound shows hourglassing  membranes. No fetal parts in bulging membranes. Cervical canal dilated 1.2cm.  Reviewed ultrasound with Dr. Elonda Husky. Will admit to [facility]. Place in trendelenburg.   ASSESSMENT 1. Incompetent cervix in pregnancy, antepartum, second trimester   2. Supervision of other normal pregnancy, antepartum   3. [redacted] weeks gestation of pregnancy   4. Abdominal pain during pregnancy in second trimester     PLAN Admit to [place] IV access Trendelenburg position Antenatal diet FHT Q shift  Jorje Guild, NP 10/20/2018  2:03 PM

## 2018-10-20 NOTE — MAU Note (Signed)
Sandra Macdonald is a 23 y.o. at [redacted]w[redacted]d here in MAU reporting: abdominal pain since last night and bleeding since this AM, noticed blood when she wipes, not wearing a pad. No vaginal discharge, no recent IC  Onset of complaint: last night  Pain score: 10/10  There were no vitals filed for this visit.    FHT:156  Lab orders placed from triage: UA, pt unable to void at this time

## 2018-10-21 ENCOUNTER — Encounter (HOSPITAL_COMMUNITY): Payer: Self-pay

## 2018-10-21 DIAGNOSIS — O3430 Maternal care for cervical incompetence, unspecified trimester: Secondary | ICD-10-CM

## 2018-10-21 DIAGNOSIS — O364XX0 Maternal care for intrauterine death, not applicable or unspecified: Secondary | ICD-10-CM

## 2018-10-21 DIAGNOSIS — O42919 Preterm premature rupture of membranes, unspecified as to length of time between rupture and onset of labor, unspecified trimester: Secondary | ICD-10-CM

## 2018-10-21 LAB — CBC
HCT: 30.7 % — ABNORMAL LOW (ref 36.0–46.0)
Hemoglobin: 9.4 g/dL — ABNORMAL LOW (ref 12.0–15.0)
MCH: 27.6 pg (ref 26.0–34.0)
MCHC: 30.6 g/dL (ref 30.0–36.0)
MCV: 90.3 fL (ref 80.0–100.0)
NRBC: 0 % (ref 0.0–0.2)
Platelets: 279 10*3/uL (ref 150–400)
RBC: 3.4 MIL/uL — ABNORMAL LOW (ref 3.87–5.11)
RDW: 13.1 % (ref 11.5–15.5)
WBC: 9.8 10*3/uL (ref 4.0–10.5)

## 2018-10-21 LAB — ABO/RH: ABO/RH(D): A POS

## 2018-10-21 MED ORDER — MISOPROSTOL 200 MCG PO TABS
ORAL_TABLET | ORAL | Status: AC
  Filled 2018-10-21: qty 4

## 2018-10-21 MED ORDER — FENTANYL CITRATE (PF) 100 MCG/2ML IJ SOLN
INTRAMUSCULAR | Status: AC
  Filled 2018-10-21: qty 2

## 2018-10-21 MED ORDER — MORPHINE SULFATE (PF) 4 MG/ML IV SOLN
INTRAVENOUS | Status: AC
  Administered 2018-10-21: 4 mg
  Filled 2018-10-21: qty 1

## 2018-10-21 MED ORDER — SODIUM CHLORIDE 0.9 % IV SOLN
500.0000 mg | INTRAVENOUS | Status: DC
  Administered 2018-10-21: 500 mg via INTRAVENOUS
  Filled 2018-10-21: qty 500

## 2018-10-21 MED ORDER — OXYTOCIN 40 UNITS IN NORMAL SALINE INFUSION - SIMPLE MED
INTRAVENOUS | Status: AC
  Filled 2018-10-21: qty 1000

## 2018-10-21 MED ORDER — MISOPROSTOL 200 MCG PO TABS
200.0000 ug | ORAL_TABLET | Freq: Once | ORAL | Status: AC
  Administered 2018-10-21: 200 ug via VAGINAL

## 2018-10-21 MED ORDER — FENTANYL CITRATE (PF) 100 MCG/2ML IJ SOLN
50.0000 ug | Freq: Once | INTRAMUSCULAR | Status: AC
  Administered 2018-10-21: 50 ug via INTRAVENOUS

## 2018-10-21 MED ORDER — MORPHINE SULFATE (PF) 4 MG/ML IV SOLN: 4.0000 mg | Freq: Once | INTRAVENOUS | Status: AC

## 2018-10-21 MED ORDER — SODIUM CHLORIDE 0.9 % IV SOLN
2.0000 g | Freq: Four times a day (QID) | INTRAVENOUS | Status: DC
  Administered 2018-10-21: 2 g via INTRAVENOUS
  Filled 2018-10-21: qty 2000

## 2018-10-21 MED ORDER — OXYTOCIN 40 UNITS IN NORMAL SALINE INFUSION - SIMPLE MED
1.0000 m[IU]/min | INTRAVENOUS | Status: DC
  Administered 2018-10-21: 41.667 m[IU]/min via INTRAVENOUS

## 2018-10-21 MED ORDER — ENOXAPARIN SODIUM 60 MG/0.6ML ~~LOC~~ SOLN
50.0000 mg | SUBCUTANEOUS | Status: DC
  Administered 2018-10-21: 50 mg via SUBCUTANEOUS
  Filled 2018-10-21: qty 0.6

## 2018-10-21 MED ORDER — OXYCODONE HCL 5 MG PO TABS: 5.0000 mg | ORAL_TABLET | ORAL | 0 refills | Status: DC | PRN

## 2018-10-21 NOTE — Progress Notes (Signed)
The chaplain checked in with Pt. RN-Caitlin for F/U spiritual care visit.  At this time the Pt. is waiting for a visit from the MD about discharge plans.  The RN will call the chaplain if needed for F/U spiritual care.

## 2018-10-21 NOTE — Progress Notes (Addendum)
Faculty Note  S: Notified by RN of non-viable fetal delivery, RN Caitlyn clamped/cut cord and wrapped fetus for family to hold. Patient requesting more pain medicine, gave 4 mg morphine.   Back in to room for increased bleeding.  O: BP (!) 100/59   Pulse 94   Temp 98 F (36.7 C) (Oral)   Resp 17   Ht 5\' 5"  (1.651 m)   Wt 92.5 kg   LMP 07/03/2018   SpO2 99%   BMI 33.93 kg/m   Gen: alert, oriented SVE: placenta in vagina, gently removed, appears intact, several large blood clots removed as well  EBL: ~600 mL  A/P: Pt is 23 y.o. G2P0010 who is s/p 2nd trimester SAB today, with bleeding and EBL: 600 mL. Placenta appears intact. S/p cytotec 200 mcg. Will monitor for further bleeding. Placenta to path. Fetus to remain with family for now.    Feliz Beam, M.D. Attending Center for [employer])

## 2018-10-21 NOTE — Discharge Summary (Signed)
Physician Discharge Summary  Patient ID: Sandra Macdonald MRN: [id number] DOB/AGE: 27-May-1996 23 y.o.  Admit date: 10/20/2018 Discharge date: 10/21/2018   Discharge Diagnoses:  Principal Problem:   Incompetent cervix in pregnancy, antepartum, second trimester Active Problems:   G6PD deficiency anemia (HCC)   Vaginal bleeding before [redacted] weeks gestation   SAB (spontaneous abortion)   Hospital Course: Please see HPI dated 10/20/2018 for details. This is a 23 y.o. G2P0020 now PPD#0 from a spontaneous vaginal delivery of a non-viable fetus @ [redacted]w[redacted]d. Patient presented yesterday with membranes hourglassing into vagina. She was admitted, started on antibiotics and put into Trendelenburg. Called to room today where fetal parts were in vagina and she was noted to have ruptured amniotic sac. Fetal head entrapped in cervix and she was given cytotec. Fetus delivered shortly thereafter, cord clamped and cut. Placenta delivered intact about an hour later. She had EBL of ~600 mL.   She was seen several hours after delivery and requested to go home. She had minimal bleeding, appropriate pain with abdominal palpation and otherwise had no complaints. She was given instructions for follow up and she will follow up in 2 weeks.  Of note, patient's mother verbalizes her unhappiness about patient's visit to MAU on Friday, where she had been told to return with cramping or bleeding, patient felt cramping on Saturday but was uncomfortable with provider from Friday and was afraid if she returned, staff would be "mad" at her. States she felt uncomfortable about coming to MAU with complaints. I asked permission to relate their complaints/concerns and escalate to management, they were agreeable. I have relayed this to house coverage and patient relations.  Physical exam  Vitals:   10/21/18 1216 10/21/18 1221 10/21/18 1238 10/21/18 1613  BP: 104/61 107/67 115/63 122/64  Pulse: 97 96 96 97  Resp: 17 16  18   Temp:  98.4 F  (36.9 C)  99.4 F (37.4 C)  TempSrc:  Oral  Oral  SpO2:    100%  Weight:      Height:       Physical Exam:  General: alert, oriented, cooperative Chest: normal respiratory effort Heart: RRR  Abdomen: soft, appropriately tender to palpation  Uterine Fundus: not palpable Lochia: moderate, rubra DVT Evaluation: no evidence of DVT Extremities: no edema, no calf tenderness   Postpartum contraception: Not Discussed  Disposition: home  Discharged Condition: fair  Discharge Instructions    Call MD for:  difficulty breathing, headache or visual disturbances   Complete by:  As directed    Call MD for:  extreme fatigue   Complete by:  As directed    Call MD for:  hives   Complete by:  As directed    Call MD for:  persistant dizziness or light-headedness   Complete by:  As directed    Call MD for:  persistant nausea and vomiting   Complete by:  As directed    Call MD for:  redness, tenderness, or signs of infection (pain, swelling, redness, odor or green/yellow discharge around incision site)   Complete by:  As directed    Call MD for:  severe uncontrolled pain   Complete by:  As directed    Call MD for:  temperature >100.4   Complete by:  As directed    Diet - low sodium heart healthy   Complete by:  As directed      Allergies as of 10/21/2018      Reactions   Blue Dyes (parenteral) Other (See  Comments)   G6PD-- Decreased hemoglobin   Codeine Other (See Comments)   Decreased hemoglobin   Other Other (See Comments)   G6PD--Decreased hemoglobin to potato chips (not potatoes in other forms)      Medication List    STOP taking these medications   ondansetron 4 MG disintegrating tablet Commonly known as:  Zofran ODT     TAKE these medications   acetaminophen 500 MG tablet Commonly known as:  TYLENOL Take 1,000 mg by mouth every 6 (six) hours as needed for moderate pain.   oxyCODONE 5 MG immediate release tablet Commonly known as:  Roxicodone Take 1 tablet (5 mg  total) by mouth every 4 (four) hours as needed for severe pain.   prenatal multivitamin Tabs tablet Take 1 tablet by mouth daily at 12 noon.      Follow-up [place] [facility]. Schedule an appointment as soon as possible for a visit in 2 week(s).   Specialty:  Obstetrics and Gynecology Contact information: [place] [phone]           Total face-to-face time with patient: 30 minutes. Over 50% of encounter was spent on counseling and coordination of care.  Signed: Feliz Beam, M.D. Attending Center for [employer] (Faculty Practice)  10/21/2018, 5:28 PM

## 2018-10-21 NOTE — H&P (Signed)
Attestation signed by Florian Buff, MD at 10/20/2018 4:23 PM  Attestation of Attending Supervision of Advanced Practitioner (CNM/NP/PA): Evaluation and management procedures were performed by the Advanced Practitioner under my supervision and collaboration. I have reviewed the Advanced Practitioner's note and chart, and I agree with the management and plan.  Jacelyn Grip MD Attending Physician for the Center for [facility] 10/20/2018 4:23 PM     Expand All Collapse All    Show:Clear all [x] Manual[x] Template[] Copied  Added by: [x] Jorje Guild, NP  [] Hover for details Chief Complaint: Abdominal Pain and Vaginal Bleeding   First Provider Initiated Contact with Patient 10/20/18 1143     SUBJECTIVE HPI: Sandra Macdonald is a 23 y.o. G2P0010 at [redacted]w[redacted]d who presents to Maternity Admissions reporting abdominal cramping & vaginal bleeding. Symptoms started today. Reports bright red blood on toilet paper. Not saturating pads or passing clots. No recent intercourse.  Hx of 15 wk loss in 03/2018. Has not had prenatal care yet with this pregnancy. Was in MAU on Friday for abdominal pain; had closed cervix & TVUS with CL of 2.9 cm.   Location: abdomen Quality: cramping Severity: 10/10 on pain scale Duration: 1 day Timing: intermittent Modifying factors: none Associated signs and symptoms: vaginal bleeding      Past Medical History:  Diagnosis Date   Depression    Fibroid    G6PD deficiency    Supervision of normal first pregnancy, antepartum 02/18/2018    Nursing Staff  Provider Office Location            [facility]     Dating            LMP/6 week [place]  Language   English           Anatomy US       Flu Vaccine    n/a            Genetic Screen            NIPS: low risk, female  AFP:   TDaP vaccine                      Hgb A1C or  GTT   Early  Third trimester  Rhogam                               LAB RESULTS  Feeding Plan       Breast            Blood Type   A/Positive/-- (07/15  7867)  Contraception              Antibody            Negative (07/15 0942) Circumcision                 Rubella            3.00 (07/15 0942) Pediatricia                   OB History  Gravida Para Term Preterm AB Living  2       1    SAB TAB Ectopic Multiple Live Births     1               # Outcome Date GA Lbr Len/2nd Weight Sex Delivery Anes PTL Lv  2 Current  1 SAB 03/20/18 [redacted]w[redacted]d               Past Surgical History:  Procedure Laterality Date   NO PAST SURGERIES     Social History        Socioeconomic History   Marital status: Single    Spouse name: Not on file   Number of children: Not on file   Years of education: Not on file   Highest education level: 10th grade  Occupational History   Not on file  Social Needs   Financial resource strain: Not very hard   Food insecurity:    Worry: Never true    Inability: Never true   Transportation needs:    Medical: No    Non-medical: No  Tobacco Use   Smoking status: Never Smoker   Smokeless tobacco: Never Used  Substance and Sexual Activity   Alcohol use: No    Frequency: Never   Drug use: No   Sexual activity: Not Currently    Birth control/protection: None  Lifestyle   Physical activity:    Days per week: 0 days    Minutes per session: 0 min   Stress: Only a little  Relationships   Social connections:    Talks on phone: Never    Gets together: Never    Attends religious service: More than 4 times per year    Active member of club or organization: No    Attends meetings of clubs or organizations: Never    Relationship status: Never married   Intimate partner violence:    Fear of current or ex partner: No    Emotionally abused: No    Physically abused: No    Forced sexual activity: No  Other Topics Concern   Not on file  Social History Narrative   Not on file        Family History  Problem Relation Age of Onset     Mental illness Mother    ADD / ADHD Neg Hx    Anxiety disorder Neg Hx    Alcohol abuse Neg Hx    Arthritis Neg Hx    Asthma Neg Hx    Cancer Neg Hx    Birth defects Neg Hx    Depression Neg Hx    COPD Neg Hx    Diabetes Neg Hx    Drug abuse Neg Hx    Early death Neg Hx    Hearing loss Neg Hx    Heart disease Neg Hx    Hyperlipidemia Neg Hx    Hypertension Neg Hx    Intellectual disability Neg Hx    Kidney disease Neg Hx    Learning disabilities Neg Hx    Miscarriages / Stillbirths Neg Hx    Stroke Neg Hx    Obesity Neg Hx    Vision loss Neg Hx    Varicose Veins Neg Hx    No current facility-administered medications on file prior to encounter.          Current Outpatient Medications on File Prior to Encounter  Medication Sig Dispense Refill   ondansetron (ZOFRAN ODT) 4 MG disintegrating tablet Take 1 tablet (4 mg total) by mouth every 8 (eight) hours as needed for nausea or vomiting. 30 tablet 1   Prenatal Vit-Fe Fumarate-FA (PRENATAL MULTIVITAMIN) TABS tablet Take 1 tablet by mouth daily at 12 noon.          Allergies  Allergen Reactions   Blue Dyes (Parenteral) Other (See Comments)  G6PD-- Decreased hemoglobin   Codeine Other (See Comments)    Decreased hemoglobin   Other Other (See Comments)    G6PD--Decreased hemoglobin to potato chips (not potatoes in other forms)    I have reviewed patient's Past Medical Hx, Surgical Hx, Family Hx, Social Hx, medications and allergies.   Review of Systems  Constitutional: Negative.   Gastrointestinal: Positive for abdominal pain.  Genitourinary: Positive for vaginal bleeding.    OBJECTIVE Patient Vitals for the past 24 hrs:  BP Temp Temp src Pulse Resp SpO2 Height Weight  10/20/18 1137 137/79 98.1 F (36.7 C) Oral (!) 126 20 100 % -- --  10/20/18 1130 -- -- -- -- -- -- 5\' 5"  (1.651 m) 92.5 kg   Constitutional: Well-developed, well-nourished  female in no acute distress.  Cardiovascular: normal rate & rhythm, no murmur Respiratory: normal rate and effort. Lung sounds clear throughout GI: Abd soft, non-tender, Pos BS x 4. No guarding or rebound tenderness MS: Extremities nontender, no edema, normal ROM Neurologic: Alert and oriented x 4.  GU:     SPECULUM EXAM: bright red blood staining vulva. Bulging membranes into vagina             BIMANUAL: gentle digital exam performed, could not feel cervix  LAB RESULTS LabResultsLast24Hours  No results found for this or any previous visit (from the past 24 hour(s)).    IMAGING No results found.  MAU COURSE    Orders Placed This Encounter  Procedures   [place] MFM OB LIMITED   Urinalysis, Routine w reflex microscopic   CBC on admission   Trendelenberg Position   Type and screen [place] ordered this encounter  Medications   lactated ringers infusion   morphine 4 MG/ML injection 4 mg    MDM FHT present via doppler  Discussed results of exam with the patient.   C/w Dr. Elonda Husky. Will obtain bedside ultrasound to evaluate cervix.  Ultrasound shows hourglassing membranes. No fetal parts in bulging membranes. Cervical canal dilated 1.2cm.  Reviewed ultrasound with Dr. Elonda Husky. Will admit to [facility]. Place in trendelenburg.   ASSESSMENT 1. Incompetent cervix in pregnancy, antepartum, second trimester   2. Supervision of other normal pregnancy, antepartum   3. [redacted] weeks gestation of pregnancy   4. Abdominal pain during pregnancy in second trimester     PLAN Admit to [place] IV access Trendelenburg position Antenatal diet FHT Q shift  Jorje Guild, NP 10/20/2018  2:03 PM

## 2018-10-21 NOTE — Progress Notes (Signed)
Faculty Note  S: Called to room by RN for delivery. Fetal feet sticking out of vagina. Patient given fentanyl 50 mcg for pain relief.    O: BP 119/69   Pulse (!) 114   Temp 98 F (36.7 C) (Oral)   Resp 17   Ht 5\' 5"  (1.651 m)   Wt 92.5 kg   LMP 07/03/2018   SpO2 99%   BMI 33.93 kg/m   Gen: alert, oriented SVE: fetal body in vagina, head entrapped in cervix   A/P: Pt is 23 y.o. G2P0010 @ [redacted]w[redacted]d who is admitted for membranes prolapsing into vagina, now PPROM with fetal parts in vagina, head entrapped in cervix. 200 mcg cytotec given orally, will await contraction and cervical dilation, expect delivery shortly. Patient desires to see fetus.      Feliz Beam, M.D. Attending Center for [employer] Fish farm manager)

## 2018-10-21 NOTE — Progress Notes (Signed)
Patient ID: Sandra Macdonald, female   DOB: 07-30-1996, 23 y.o.   MRN: [id number] [place]) NOTE  Sandra Macdonald is a 23 y.o. G2P0010 with Estimated Date of Delivery: 04/04/19   By  early ultrasound [redacted]w[redacted]d  who is admitted for management of acute presentation for incompetent cervix, with hour glassing membranes into the vagina.    Fetal presentation is unsure. Length of Stay:  1  Days  Date of admission:10/20/2018  Subjective:  Patient reports the fetal movement as too early to feel. Patient reports uterine contraction  activity as none. Patient reports  vaginal bleeding as none. Patient describes fluid per vagina as None.  Vitals:  Blood pressure 104/64, pulse 93, temperature 99 F (37.2 C), temperature source Oral, resp. rate 17, height 5\' 5"  (1.651 m), weight 92.5 kg, last menstrual period 07/03/2018, SpO2 100 %. Vitals:   10/20/18 1505 10/20/18 1943 10/21/18 0223 10/21/18 0602  BP: 117/60 (!) 100/32 (!) 101/47 104/64  Pulse: 87 93 88 93  Resp: 18 18 18 17   Temp: 98.5 F (36.9 C) 98.1 F (36.7 C) 98.6 F (37 C) 99 F (37.2 C)  TempSrc: Oral Oral Oral Oral  SpO2: 100% 100% 100% 100%  Weight:      Height:       Physical Examination:  General appearance - alert, well appearing, and in no distress Abdomen - soft, nontender, nondistended, no masses or organomegaly Fundal Height:  size equals dates Pelvic Exam:  examination not indicated Cervical Exam: Not evaluated.  Extremities: extremities normal, atraumatic, no cyanosis or edema with DTRs 2+ bilaterally Membranes:intact  Fetal Monitoring:       Labs:  Results for orders placed or performed during the hospital encounter of 10/20/18 (from the past 24 hour(s))  Type and screen [place]   Collection Time: 10/20/18  2:10 PM  Result Value Ref Range   ABO/RH(D) A POS    Antibody Screen NEG    Sample Expiration      10/23/2018 Performed at [facility], [place] [address],  [place], [place] [address]   ABO/Rh   Collection Time: 10/20/18  2:10 PM  Result Value Ref Range   ABO/RH(D)      A POS Performed at [facility] [address], [place], [place] [address]   CBC on admission   Collection Time: 10/20/18  2:16 PM  Result Value Ref Range   WBC 8.8 4.0 - 10.5 K/uL   RBC 4.24 3.87 - 5.11 MIL/uL   Hemoglobin 11.8 (L) 12.0 - 15.0 g/dL   HCT 38.4 36.0 - 46.0 %   MCV 90.6 80.0 - 100.0 fL   MCH 27.8 26.0 - 34.0 pg   MCHC 30.7 30.0 - 36.0 g/dL   RDW 13.1 11.5 - 15.5 %   Platelets 324 150 - 400 K/uL   nRBC 0.0 0.0 - 0.2 %    Imaging Studies:    Bulging 6 cm of hour glassing membranes in the vagina with no fetal parts in BOW, full report to follow  Medications:  Scheduled . docusate sodium  100 mg Oral Daily  . enoxaparin (LOVENOX) injection  40 mg Subcutaneous Q24H  . prenatal multivitamin  1 tablet Oral Q1200   I have reviewed the patient's current medications.  ASSESSMENT: G2P0010 [redacted]w[redacted]d Estimated Date of Delivery: 04/04/19   Patient Active Problem List   Diagnosis Date Noted  . 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 Supervision of other normal  pregnancy, antepartum 09/16/2018  . Vaginal bleeding before [redacted] weeks gestation 03/20/2018  . SAB (spontaneous abortion) 03/20/2018  . Depression 02/18/2018  . G6PD deficiency anemia ([place]) 01/14/2018  . Fibroids 12/30/2017    PLAN: >continue in Trendelenburg position >antibiotics begun in case emergency cerclage is possible if fluid membranes recede >re evaluate status of cervix tomorrow, 48 hours of Trendelenburg position with foley in place >lovenox prophylaxis  Mertie Clause  10/21/2018,8:09 AM

## 2018-10-21 NOTE — Progress Notes (Signed)
Patient discharged home with her mother and significant other. Discharge teaching, pain management, follow-up appts, medications, and reasons to seek care discussed. Pt verbalized understanding. Baby left at hospital  for hospital disposal.

## 2018-10-21 NOTE — Progress Notes (Addendum)
Infant delivered at 1105am. Placenta delivered intact at 1150am. Dr. Rosana Hoes at bedside for delivery of placetna.

## 2018-10-21 NOTE — Progress Notes (Signed)
   10/21/18 1400  Clinical Encounter Type  Visited With Patient and family together  Visit Type Initial;Spiritual support  Referral From Nurse  Consult/Referral To Chaplain  The chaplain responded to Pt.spiritual care referral for non viable fetal delivery.  The chaplain checked the chart and talked to the Pt. RN-Caitlin before entering the room.  The chaplain was pastorally present with the Pt. and family. The chaplain heard the Pt. mother's questions and concern about the Pt.grieving process and the possible need for counseling. The chaplain confirmed the Pt. interest in having her baby (a name has been chosen by the Pt.) return to the room later in the day.  The Pt. and mother recognize a Apostolic faith tradition. The Pt. and family have plans to rest at the this time. The chaplain will return for F/U spiritual care as needed.

## 2018-10-21 NOTE — Progress Notes (Signed)
CSW received consult due to IUFD.  CSW available for support secondary to [facility] and will await call from [place] before becoming involved.  CSW screening out referral at this time.  Ollen Barges, [place]  [facility] [employer] [phone]

## 2018-10-22 ENCOUNTER — Telehealth: Payer: Self-pay | Admitting: General Practice

## 2018-10-22 NOTE — Telephone Encounter (Signed)
Left message for patient to give our office a call to reschedule New OB appointment.

## 2018-10-31 ENCOUNTER — Telehealth: Payer: Self-pay | Admitting: Family Medicine

## 2018-10-31 NOTE — Telephone Encounter (Signed)
Attempted to call patient to get her scheduled for her postpartum visit. No answer, left voicemail with appointment time and date. Also left office number if needing to reschedule. Reminder mailed

## 2018-11-20 ENCOUNTER — Telehealth: Payer: Self-pay | Admitting: Obstetrics & Gynecology

## 2018-11-20 NOTE — Telephone Encounter (Signed)
Called the patient to inform of the upcoming appointment. Stated she is at work. Sending the patient a [employer].

## 2018-11-21 ENCOUNTER — Ambulatory Visit: Payer: Medicaid Other | Admitting: Obstetrics and Gynecology

## 2018-11-21 ENCOUNTER — Telehealth: Payer: Self-pay | Admitting: Clinical

## 2018-11-21 ENCOUNTER — Other Ambulatory Visit: Payer: Self-pay

## 2018-11-21 ENCOUNTER — Encounter: Payer: Self-pay | Admitting: Obstetrics and Gynecology

## 2018-11-21 NOTE — Telephone Encounter (Signed)
Left HIPPA-compliant message to call back Sandra Macdonald from Center for [employer] at [phone].

## 2018-11-21 NOTE — Progress Notes (Signed)
Patient checked in by RN but did not answer phone x4 when attempted to call to connect with provider.

## 2018-11-22 ENCOUNTER — Telehealth: Payer: Self-pay | Admitting: Family Medicine

## 2018-11-22 NOTE — Telephone Encounter (Signed)
Attempted to call patient with her rescheduled postpartum visit. No answer, left detailed message with appointment time and date (4/24 @ 10:35) and that it will be a virtual visit so they will will be giving her a call. Office number given if needing to reschedule.

## 2018-11-28 ENCOUNTER — Telehealth: Payer: Self-pay | Admitting: Obstetrics & Gynecology

## 2018-11-28 NOTE — Telephone Encounter (Signed)
Left the patient a voicemail message regarding the upcoming visit. Informed the patient to call our clinic if she has any questions or concerns.

## 2018-11-29 ENCOUNTER — Ambulatory Visit: Payer: Medicaid Other | Admitting: Obstetrics and Gynecology

## 2018-11-29 ENCOUNTER — Other Ambulatory Visit: Payer: Self-pay

## 2018-11-29 DIAGNOSIS — Z91199 Patient's noncompliance with other medical treatment and regimen due to unspecified reason: Secondary | ICD-10-CM

## 2018-11-29 DIAGNOSIS — Z5329 Procedure and treatment not carried out because of patient's decision for other reasons: Secondary | ICD-10-CM

## 2018-11-29 NOTE — Progress Notes (Signed)
1st attempt: Called pt @ 10:27a, no answer, left VM for call back, advised will call her again in a couple of minutes.  2nd attempt: Still no answer, left VM to reschedule appointment.

## 2018-12-05 ENCOUNTER — Encounter: Payer: Self-pay | Admitting: *Deleted

## 2019-07-12 IMAGING — US US OB TRANSVAGINAL
1 series · 15 of 26 positions shown · non-contrast
Comparison: December 30, 2017

CLINICAL DATA: Vaginal bleeding.  Pregnant patient.  Low back pain.

EXAM:
TRANSVAGINAL OB ULTRASOUND
TECHNIQUE: Transvaginal ultrasound was performed for complete evaluation of the
gestation as well as the maternal uterus, adnexal regions, and
pelvic cul-de-sac.

[Series 1: us ob transvaginal · 15 of 26 slices shown]
[im 1/26]
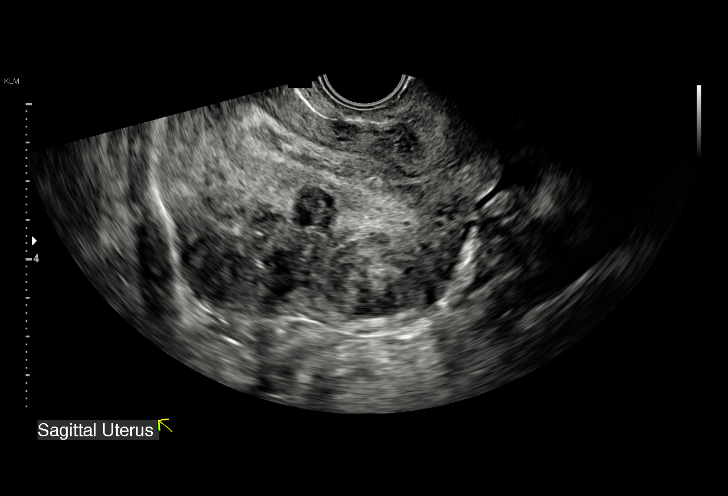
[im 3/26]
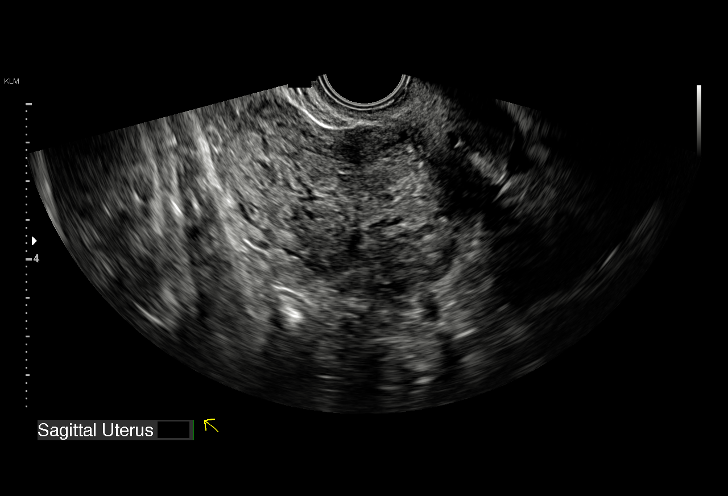
[im 5/26]
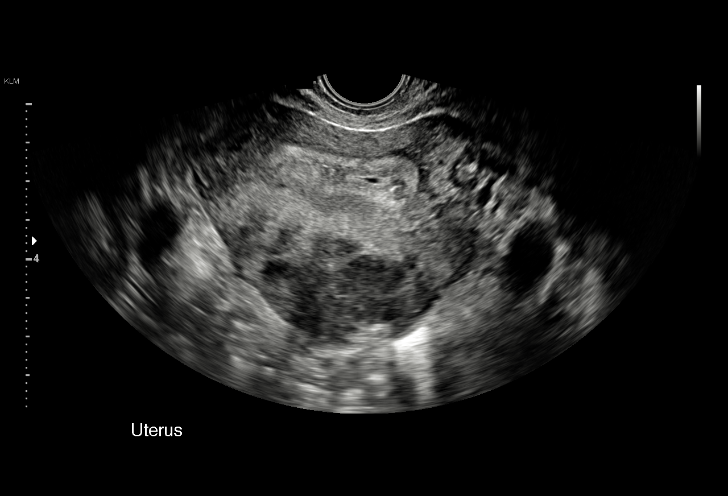
[im 7/26]
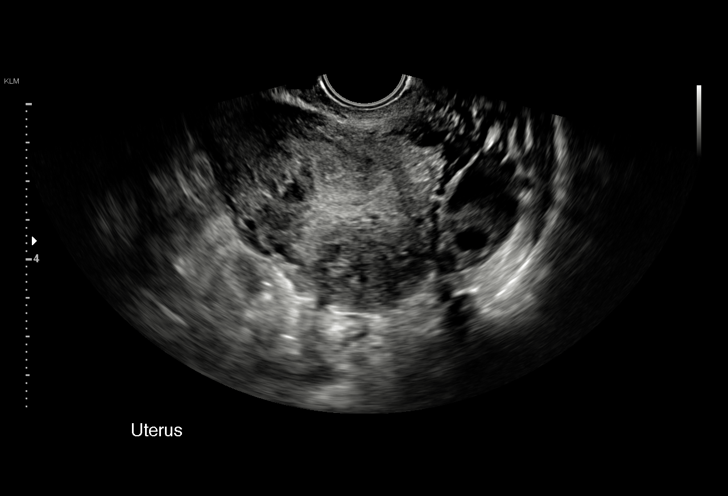
[im 8/26]
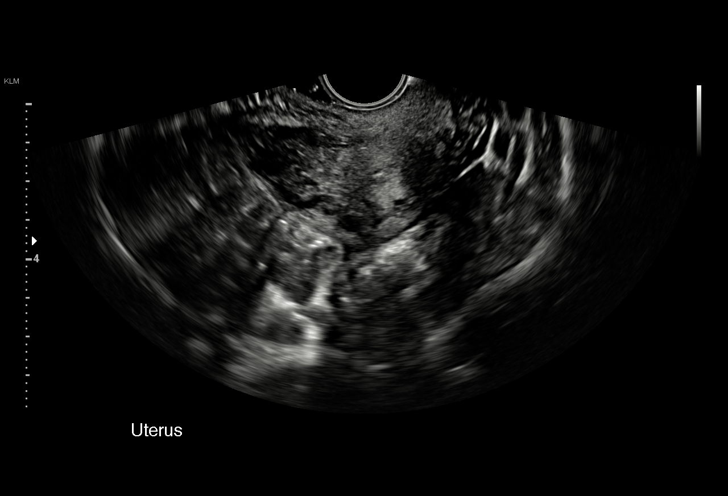
[im 10/26]
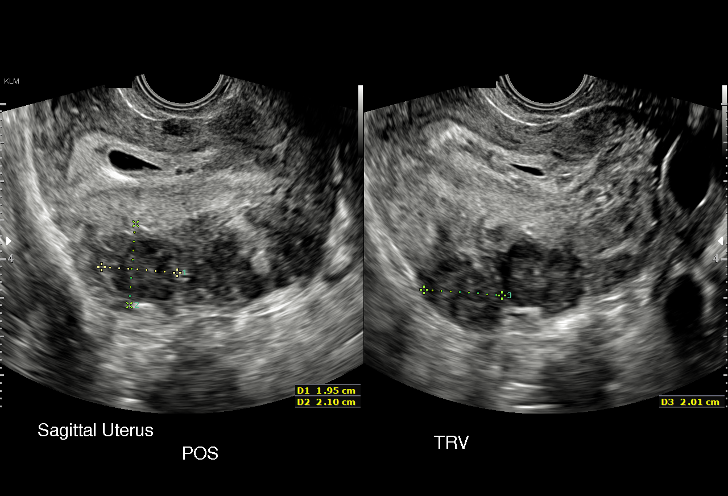
[im 12/26]
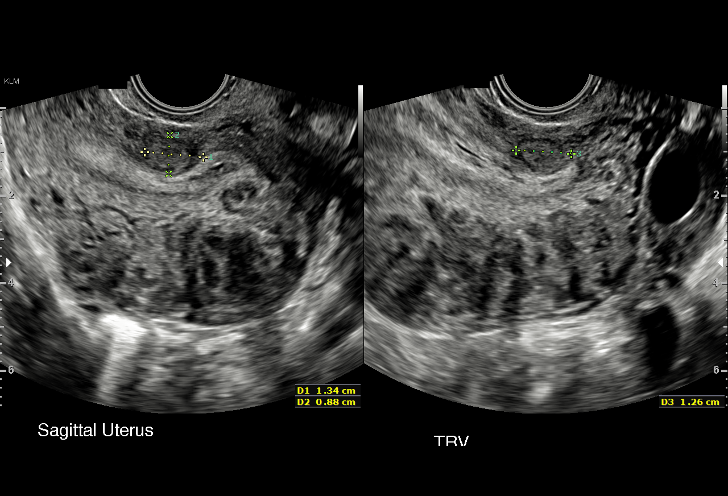
[im 14/26]
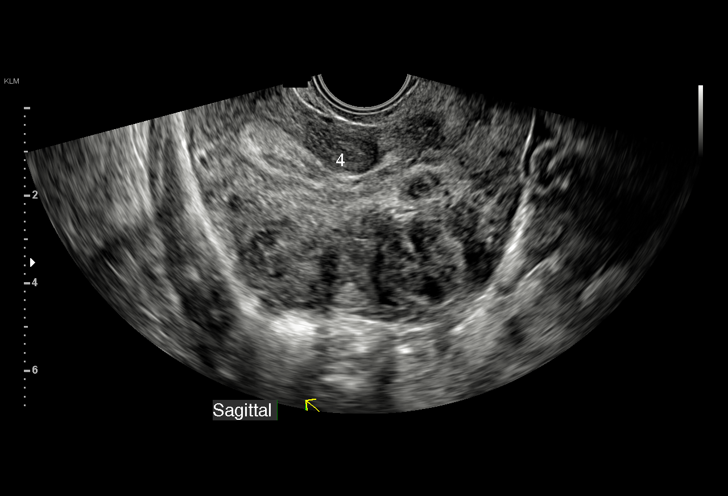
[im 15/26]
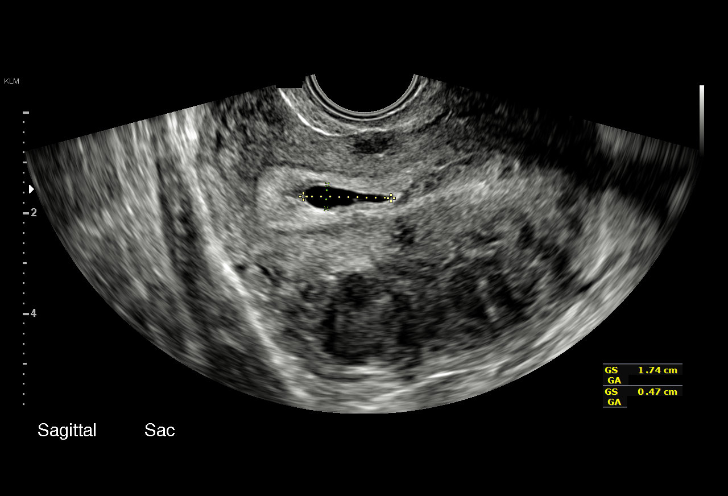
[im 17/26]
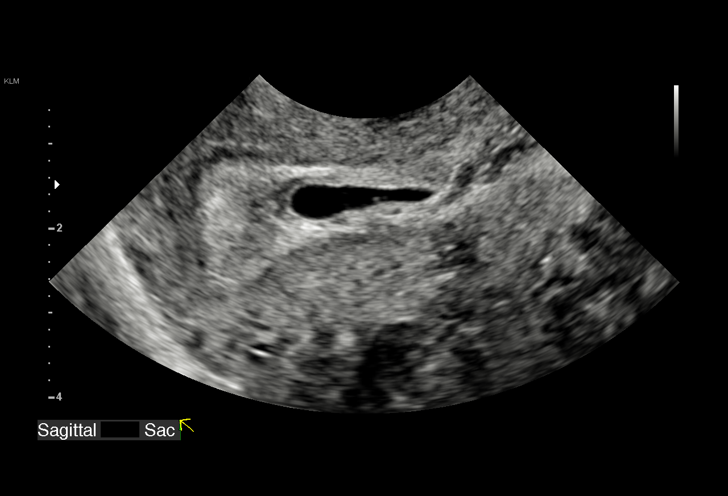
[im 19/26]
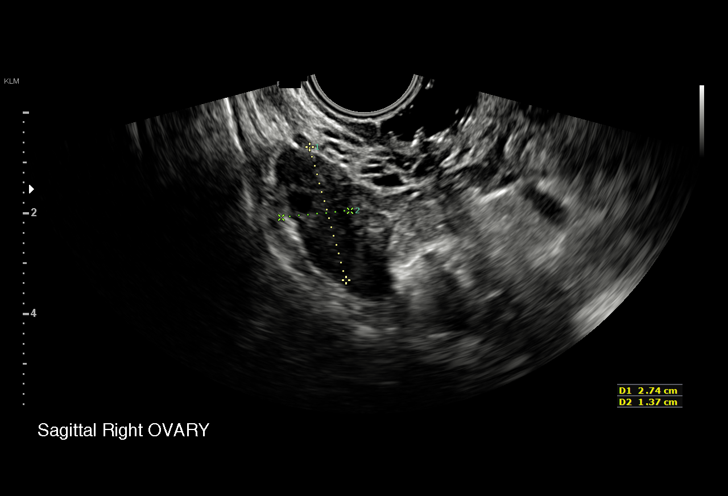
[im 20/26]
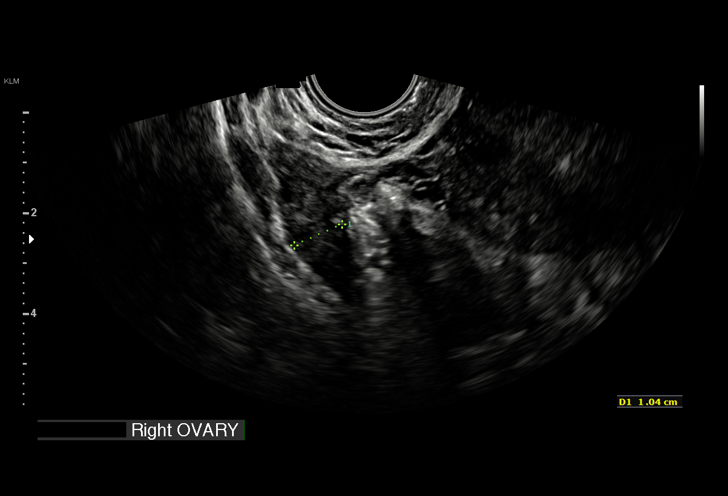
[im 22/26]
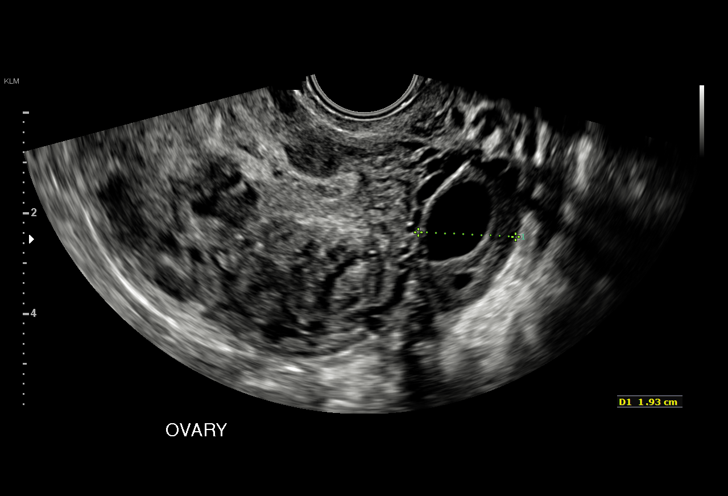
[im 24/26]
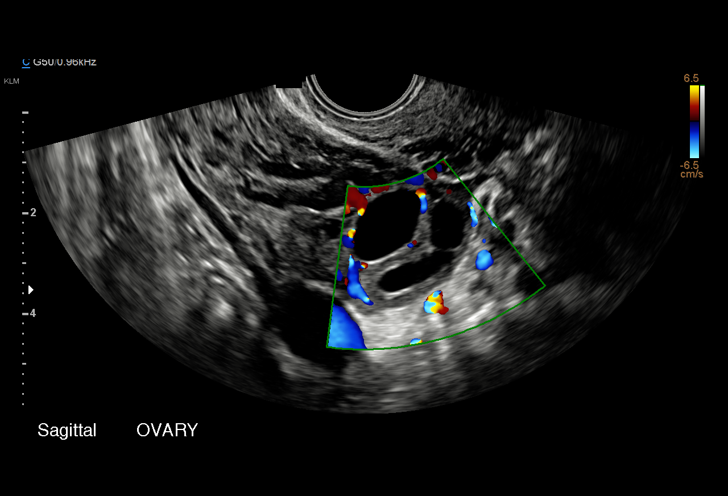
[im 26/26]
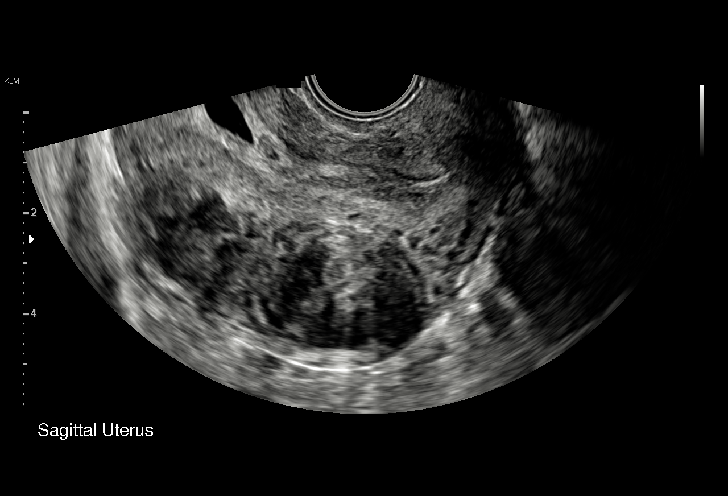

[15 of 26 positions shown; findings below may reference images not displayed]

FINDINGS: Intrauterine gestational sac: There is an oval fluid collection in
the endometrium, possibly an early gestational sac.

MSD: 11.3 mm   5 w   6 d

CRL:     mm    w  d                  US EDC:

Subchorionic hemorrhage:  None visualized.

Maternal uterus/adnexae: The ovaries are normal in appearance.
Multiple fibroids are seen throughout the uterus, also described on
the previous study. The largest measures up to 2.9 cm.
IMPRESSION: 1. Probable early intrauterine gestational sac, but no yolk sac,
fetal pole, or cardiac activity yet visualized. Recommend follow-up
quantitative B-HCG levels and follow-up US in 14 days to assess
viability. This recommendation follows SRU consensus guidelines:
Diagnostic Criteria for Nonviable Pregnancy Early in the First
Trimester. N Engl J Med 6258; [DATE].
2. Multiple fibroids in the uterus.

## 2019-07-18 IMAGING — US US OB TRANSVAGINAL
1 series · 15 of 28 positions shown · non-contrast
Comparison: 01/04/2018

CLINICAL DATA: Pregnancy of unknown anatomic location

EXAM:
TRANSVAGINAL OB ULTRASOUND
TECHNIQUE: Transvaginal ultrasound was performed for complete evaluation of the
gestation as well as the maternal uterus, adnexal regions, and
pelvic cul-de-sac.

[Series 1: us ob transvaginal · 15 of 49 slices shown]
[im 1/49]
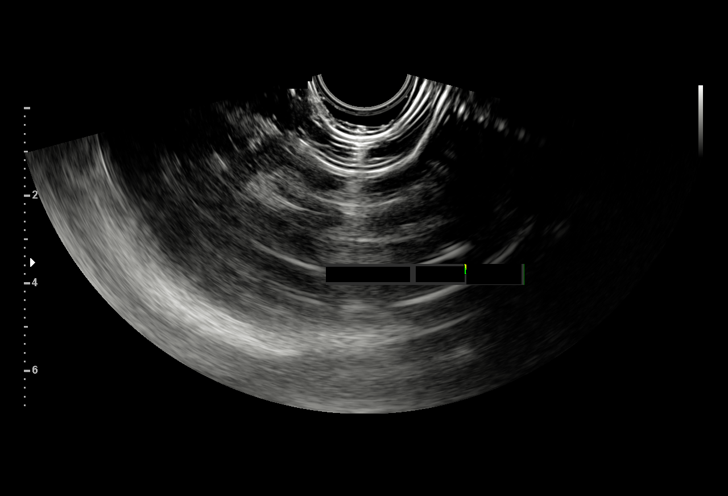
[im 4/49]
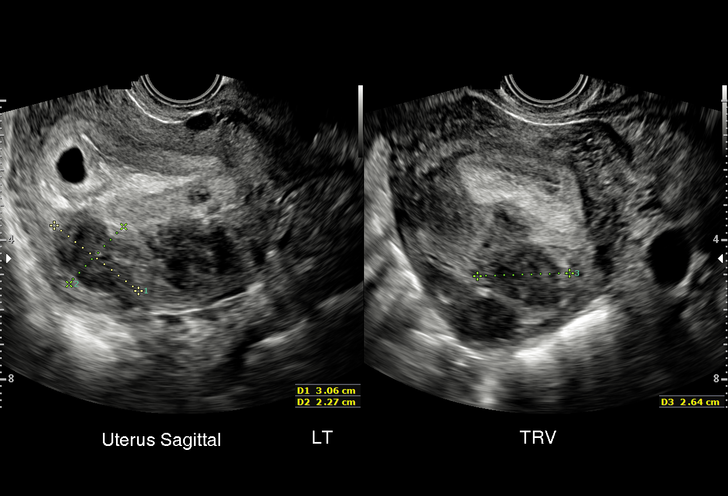
[im 8/49]
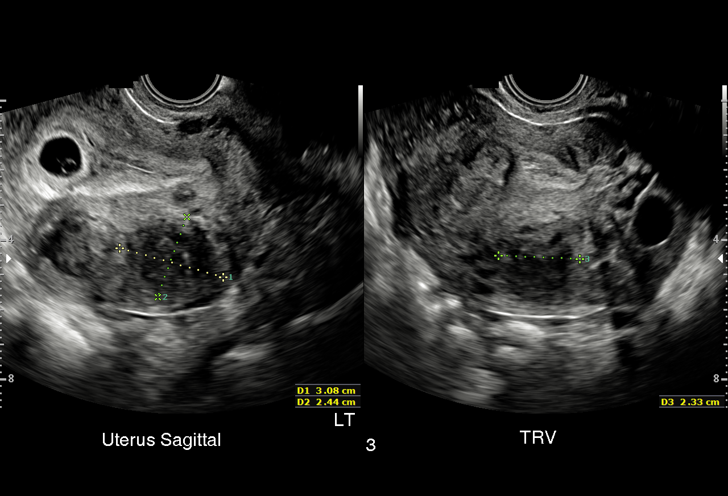
[im 11/49]
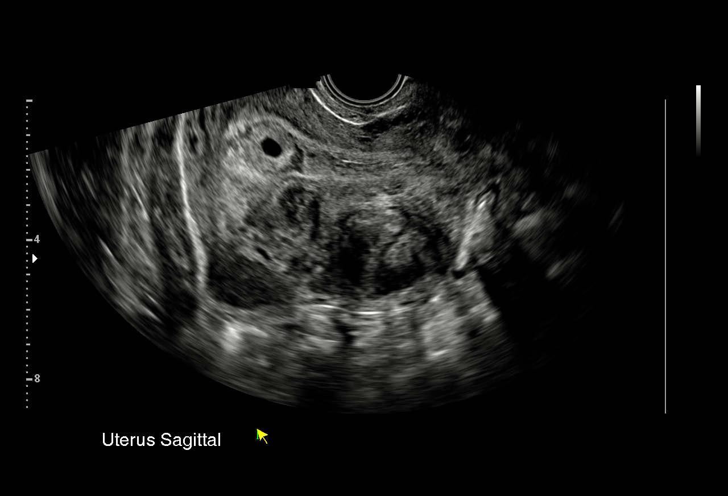
[im 15/49]
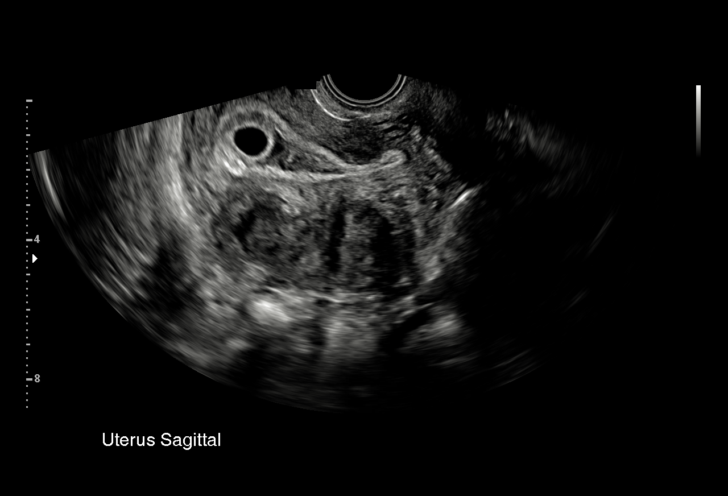
[im 18/49]
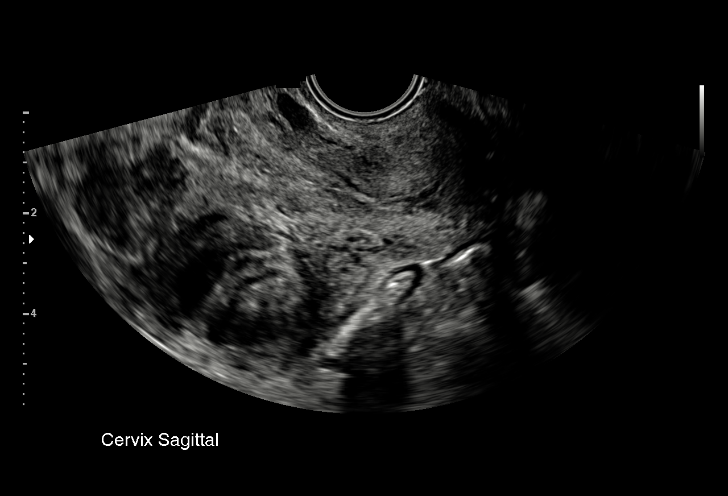
[im 22/49]
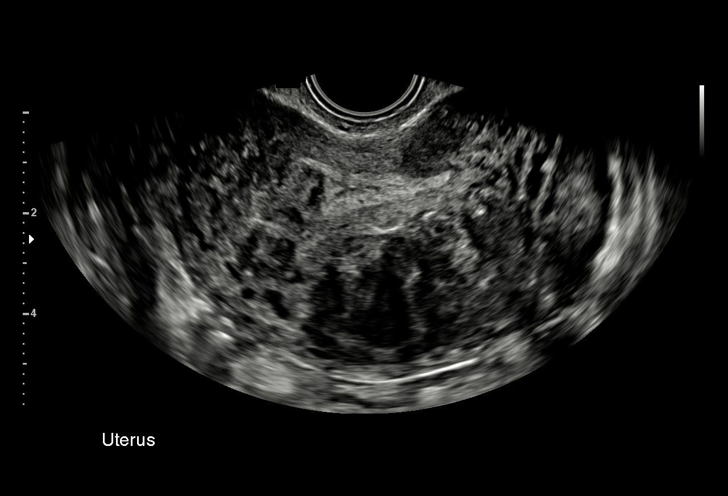
[im 25/49]
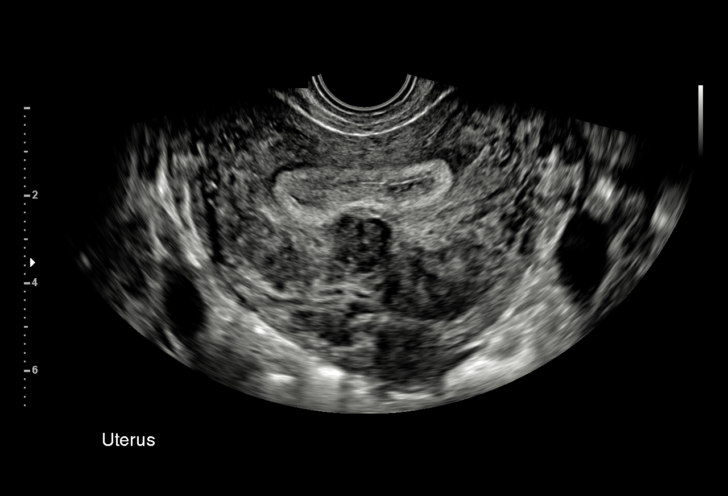
[im 27/49]
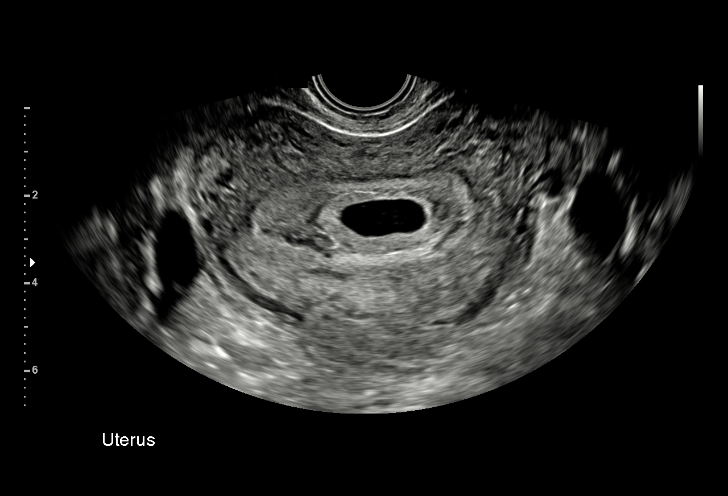
[im 31/49]
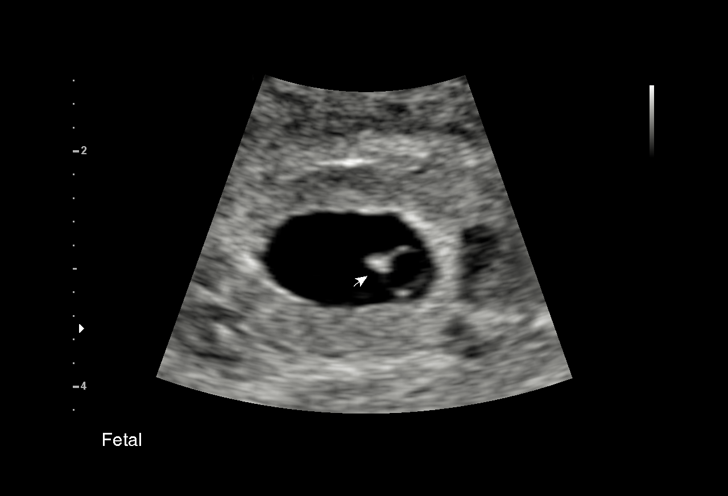
[im 34/49]
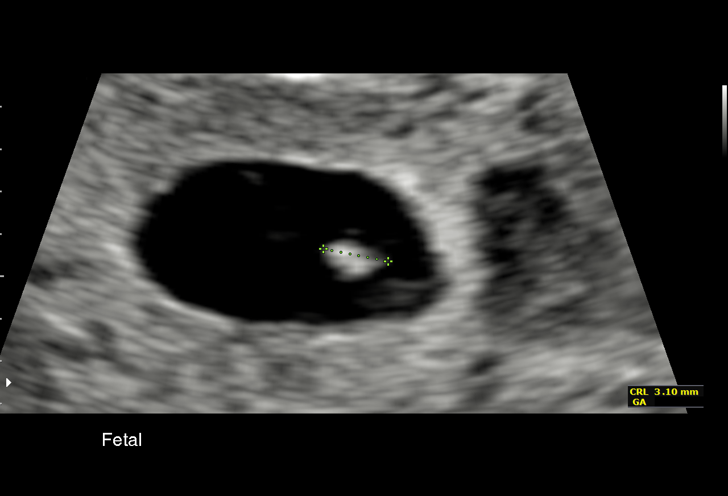
[im 38/49]
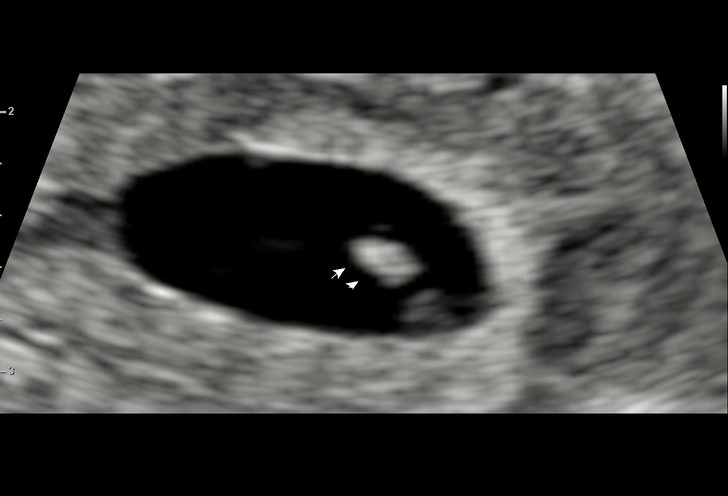
[im 41/49]
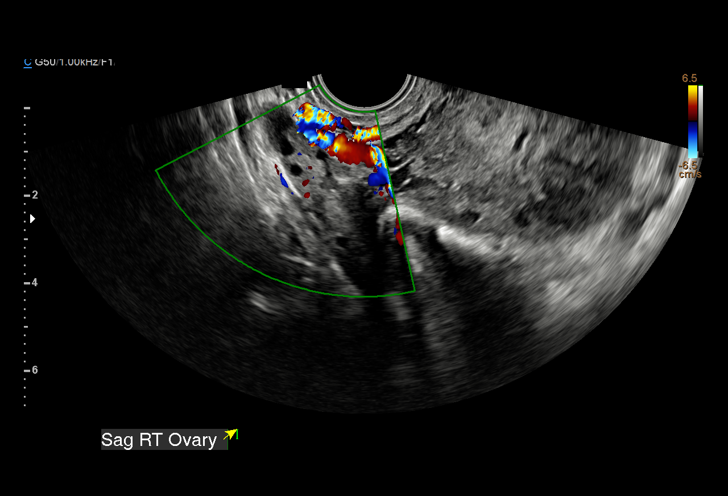
[im 45/49]
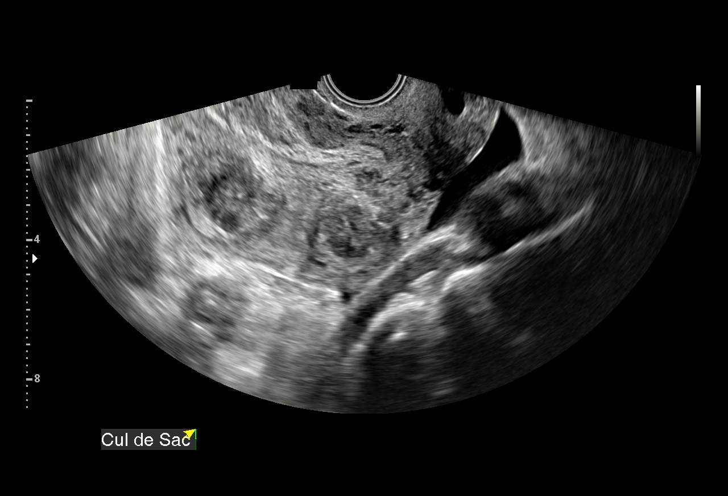
[im 49/49]
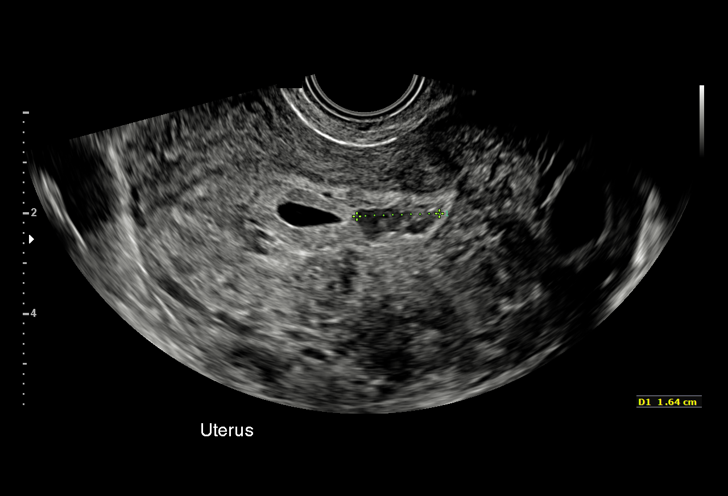

[15 of 28 positions shown; findings below may reference images not displayed]

FINDINGS: Intrauterine gestational sac: Present, single

Yolk sac:  Present

Embryo:  Present

Cardiac Activity: Present

Heart Rate: 110 bpm

CRL:   3.1 mm   5 w 6 d                  US EDC: 09/06/2017

Subchorionic hemorrhage:  Subchronic hemorrhage 30 x 5 x 16 mm

Maternal uterus/adnexae:

Uterine leiomyomata again identified largest intramural 3.6 x 2.5 x
3.8 cm.

LEFT ovary normal size and morphology 2.4 x 3.8 x 2.3 cm.

RIGHT ovary normal size and morphology 3.8 x 1.4 x 1.5 cm.

Trace free pelvic fluid.

No adnexal masses.
IMPRESSION: Single live intrauterine gestation as above.

Moderate subchronic hemorrhage.

Uterine leiomyomata.

## 2019-12-01 ENCOUNTER — Ambulatory Visit (HOSPITAL_COMMUNITY)
Admission: RE | Admit: 2019-12-01 | Discharge: 2019-12-01 | Disposition: A | Discharge status: Home / Self Care | Payer: Medicaid Other | Attending: Psychiatry | Admitting: Psychiatry

## 2019-12-01 DIAGNOSIS — Z818 Family history of other mental and behavioral disorders: Secondary | ICD-10-CM | POA: Diagnosis not present

## 2019-12-01 DIAGNOSIS — R440 Auditory hallucinations: Secondary | ICD-10-CM | POA: Diagnosis not present

## 2019-12-01 DIAGNOSIS — R45850 Homicidal ideations: Secondary | ICD-10-CM | POA: Insufficient documentation

## 2019-12-01 DIAGNOSIS — F329 Major depressive disorder, single episode, unspecified: Secondary | ICD-10-CM | POA: Insufficient documentation
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
## 2019-12-01 NOTE — ED Notes (Addendum)
Lindon Romp, NP, patient meets inpatient criteria. TTS to secure placement. [facility] Charge RN contacted to inform them of patient transfer to [facility] from [facility] Observation Unit.

## 2019-12-02 ENCOUNTER — Other Ambulatory Visit: Payer: Self-pay

## 2019-12-02 ENCOUNTER — Emergency Department (HOSPITAL_COMMUNITY)
Admission: EM | Admit: 2019-12-02 | Discharge: 2019-12-02 | Disposition: A | Discharge status: Other Acute Inpatient Hospital | Payer: Medicaid Other | Attending: Emergency Medicine | Admitting: Emergency Medicine

## 2019-12-02 ENCOUNTER — Encounter (HOSPITAL_COMMUNITY): Payer: Self-pay

## 2019-12-02 ENCOUNTER — Encounter (HOSPITAL_COMMUNITY): Payer: Self-pay | Admitting: Psychiatry

## 2019-12-02 ENCOUNTER — Inpatient Hospital Stay (HOSPITAL_COMMUNITY)
Admission: AD | Admit: 2019-12-02 | Discharge: 2019-12-09 | DRG: 897 | Disposition: A | Discharge status: Home / Self Care | Payer: Medicaid Other | Source: Intra-hospital | Attending: Psychiatry | Admitting: Psychiatry

## 2019-12-02 DIAGNOSIS — R45851 Suicidal ideations: Secondary | ICD-10-CM | POA: Diagnosis present

## 2019-12-02 DIAGNOSIS — Z79899 Other long term (current) drug therapy: Secondary | ICD-10-CM

## 2019-12-02 DIAGNOSIS — Z20822 Contact with and (suspected) exposure to covid-19: Secondary | ICD-10-CM | POA: Diagnosis present

## 2019-12-02 DIAGNOSIS — G4700 Insomnia, unspecified: Secondary | ICD-10-CM | POA: Diagnosis present

## 2019-12-02 DIAGNOSIS — F22 Delusional disorders: Secondary | ICD-10-CM | POA: Insufficient documentation

## 2019-12-02 DIAGNOSIS — F329 Major depressive disorder, single episode, unspecified: Secondary | ICD-10-CM | POA: Diagnosis present

## 2019-12-02 DIAGNOSIS — F19959 Other psychoactive substance use, unspecified with psychoactive substance-induced psychotic disorder, unspecified: Principal | ICD-10-CM | POA: Diagnosis present

## 2019-12-02 DIAGNOSIS — R45850 Homicidal ideations: Secondary | ICD-10-CM | POA: Insufficient documentation

## 2019-12-02 DIAGNOSIS — D75A Glucose-6-phosphate dehydrogenase (G6PD) deficiency without anemia: Secondary | ICD-10-CM | POA: Diagnosis present

## 2019-12-02 DIAGNOSIS — K219 Gastro-esophageal reflux disease without esophagitis: Secondary | ICD-10-CM | POA: Diagnosis present

## 2019-12-02 DIAGNOSIS — F419 Anxiety disorder, unspecified: Secondary | ICD-10-CM | POA: Diagnosis present

## 2019-12-02 DIAGNOSIS — F29 Unspecified psychosis not due to a substance or known physiological condition: Secondary | ICD-10-CM | POA: Diagnosis present

## 2019-12-02 DIAGNOSIS — F4320 Adjustment disorder, unspecified: Secondary | ICD-10-CM

## 2019-12-02 DIAGNOSIS — D649 Anemia, unspecified: Secondary | ICD-10-CM | POA: Diagnosis present

## 2019-12-02 DIAGNOSIS — Z818 Family history of other mental and behavioral disorders: Secondary | ICD-10-CM

## 2019-12-02 DIAGNOSIS — F1290 Cannabis use, unspecified, uncomplicated: Secondary | ICD-10-CM | POA: Diagnosis present

## 2019-12-02 LAB — CBC
HCT: 33.8 % — ABNORMAL LOW (ref 36.0–46.0)
Hemoglobin: 9.7 g/dL — ABNORMAL LOW (ref 12.0–15.0)
MCH: 21.1 pg — ABNORMAL LOW (ref 26.0–34.0)
MCHC: 28.7 g/dL — ABNORMAL LOW (ref 30.0–36.0)
MCV: 73.5 fL — ABNORMAL LOW (ref 80.0–100.0)
Platelets: 459 10*3/uL — ABNORMAL HIGH (ref 150–400)
RBC: 4.6 MIL/uL (ref 3.87–5.11)
RDW: 18.3 % — ABNORMAL HIGH (ref 11.5–15.5)
WBC: 11.3 10*3/uL — ABNORMAL HIGH (ref 4.0–10.5)
nRBC: 0 % (ref 0.0–0.2)

## 2019-12-02 LAB — ACETAMINOPHEN LEVEL: Acetaminophen (Tylenol), Serum: <10 ug/mL — ABNORMAL LOW (ref 10–30)

## 2019-12-02 LAB — RAPID URINE DRUG SCREEN, HOSP PERFORMED
Amphetamines: NOT DETECTED
Barbiturates: NOT DETECTED
Benzodiazepines: NOT DETECTED
Cocaine: NOT DETECTED
Opiates: NOT DETECTED
Tetrahydrocannabinol: POSITIVE — AB

## 2019-12-02 LAB — COMPREHENSIVE METABOLIC PANEL
ALT: 25 U/L (ref 0–44)
AST: 23 U/L (ref 15–41)
Albumin: 4.2 g/dL (ref 3.5–5.0)
Alkaline Phosphatase: 59 U/L (ref 38–126)
Anion gap: 9 (ref 5–15)
BUN: 13 mg/dL (ref 6–20)
CO2: 25 mmol/L (ref 22–32)
Calcium: 9.4 mg/dL (ref 8.9–10.3)
Chloride: 103 mmol/L (ref 98–111)
Creatinine, Ser: 0.73 mg/dL (ref 0.44–1.00)
GFR calc Af Amer: >60 mL/min (ref >60)
GFR calc non Af Amer: >60 mL/min (ref >60)
Glucose, Bld: 101 mg/dL — ABNORMAL HIGH (ref 70–99)
Potassium: 3.6 mmol/L (ref 3.5–5.1)
Sodium: 137 mmol/L (ref 135–145)
Total Bilirubin: 0.5 mg/dL (ref 0.3–1.2)
Total Protein: 9 g/dL — ABNORMAL HIGH (ref 6.5–8.1)

## 2019-12-02 LAB — RESPIRATORY PANEL BY RT PCR (FLU A&B, COVID)
Influenza A by PCR: NEGATIVE
Influenza B by PCR: NEGATIVE
SARS Coronavirus 2 by RT PCR: NEGATIVE

## 2019-12-02 LAB — I-STAT BETA HCG BLOOD, ED (MC, WL, AP ONLY): I-stat hCG, quantitative: <5 m[IU]/mL (ref <5)

## 2019-12-02 LAB — SALICYLATE LEVEL: Salicylate Lvl: <7 mg/dL — ABNORMAL LOW (ref 7.0–30.0)

## 2019-12-02 LAB — ETHANOL: Alcohol, Ethyl (B): <10 mg/dL (ref <10)

## 2019-12-02 MED ORDER — OLANZAPINE 5 MG PO TABS
5.0000 mg | ORAL_TABLET | Freq: Every day | ORAL | Status: DC
  Administered 2019-12-02 – 2019-12-03 (×2): 5 mg via ORAL
  Filled 2019-12-02: qty 1
  Filled 2019-12-02: qty 2
  Filled 2019-12-02 (×4): qty 1

## 2019-12-02 MED ORDER — OLANZAPINE 2.5 MG PO TABS
2.5000 mg | ORAL_TABLET | ORAL | Status: AC
  Administered 2019-12-02: 2.5 mg via ORAL
  Filled 2019-12-02: qty 1

## 2019-12-02 MED ORDER — OLANZAPINE 5 MG PO TABS: 5.0000 mg | ORAL_TABLET | Freq: Every day | ORAL | Status: DC
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
## 2019-12-02 NOTE — Plan of Care (Signed)
[facility] Observation Crisis Plan  Reason for Crisis Plan:  Crisis Stabilization   Plan of Care:  Referral for Telepsychiatry/Psychiatric Consult  Family Support:      Current Living Environment:  Living Arrangements: Parent, Other relatives  Insurance:   Hospital Account    Name Acct ID Class Status Primary Coverage   Sandra Macdonald, Sandra Macdonald [place]:[id number] [place]        Guarantor Account (for Hospital Account [id number])    Name Relation to Pt Service Area Active? Acct Type   [name] Self [facility] Yes Behavioral Health   Address Phone       [id number] [place], [place] [address] [phone])          Coverage Information (for Hospital Account [id number])    F/O Payor/Plan Precert #   [facility] MEDICAID/[place] #   Sandra Macdonald, Sandra Macdonald   Address Phone   [address] [place], [place] [address] [phone]      Legal Guardian:     Primary Care Provider:  Patient, No Pcp Per  Current Outpatient Providers:  None  Psychiatrist:     Counselor/Therapist:     Compliant with Medications:  No, none currently prescribed  Additional Information:   Sandra Macdonald 4/27/20218:33 PM
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
## 2019-12-02 NOTE — Discharge Summary (Signed)
  Sandra Macdonald, 24 y.o., female patient seen via tele psych by this provider, Dr. Dwyane Dee; and chart reviewed on 12/02/19.  On evaluation Sandra Macdonald reports she asked her mother to bring her to the hospital because "I didn't want to hurt nobody in the house."  Patient states that she has been feeling paranoid that the people in her house (mother, sister, father) were trying to set her up and hurt her.  Patient denies suicidal/self-harm ideation, and psychosis.  Continues to endorse paranoia, and some thoughts of wanting to hurt others in her house hold.  Patient states that she smokes marijuana daily and feel that the way she feels may be related to the marijuana.  Patient denies prior psychiatric history.    Patient has been accepted to [facility] for observation\  Patient is to be transferred to [facility] Observation Unit

## 2019-12-02 NOTE — Progress Notes (Addendum)
Pt accepted to [facility] Adult Unit; 205-01.   Shuvon Rankin, NP is the accepting provider.    Dr. Dwyane Dee is the attending provider.    Call report to [phone].    Taquita @ [facility] ED notified.     Pt is Voluntary.    Pt may be transported by [facility].   Pt scheduled as soon as possible; pending negative COVID screen.   Domenic Schwab, MSW, LCSW-A Clinical Disposition Social Worker [facility]/TTS [phone]

## 2019-12-02 NOTE — ED Notes (Signed)
Call report to [facility]
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
## 2019-12-02 NOTE — ED Notes (Signed)
Transport called.

## 2019-12-02 NOTE — ED Triage Notes (Signed)
Patient arrived and unwilling to answer any questions at this time but tearful in triage. Stating she has no pain or medical complaints at this time. Denies SI and HI while in triage.

## 2019-12-02 NOTE — Plan of Care (Signed)
This Probation officer notified [facility] staff that pt needs covid testing for her upcoming [facility] admission. Pt to be admitted to Rm 205-01 pending negative covid results.   Brita Romp Bed Placement/Flow Manager [facility] [phone]

## 2019-12-02 NOTE — ED Notes (Signed)
[facility] called back stated that they will be able to get patient in an hour.

## 2019-12-03 DIAGNOSIS — F29 Unspecified psychosis not due to a substance or known physiological condition: Secondary | ICD-10-CM | POA: Diagnosis present

## 2019-12-03 DIAGNOSIS — D75A Glucose-6-phosphate dehydrogenase (G6PD) deficiency without anemia: Secondary | ICD-10-CM | POA: Diagnosis present

## 2019-12-03 DIAGNOSIS — F419 Anxiety disorder, unspecified: Secondary | ICD-10-CM | POA: Diagnosis present

## 2019-12-03 DIAGNOSIS — D649 Anemia, unspecified: Secondary | ICD-10-CM | POA: Diagnosis present

## 2019-12-03 DIAGNOSIS — F19959 Other psychoactive substance use, unspecified with psychoactive substance-induced psychotic disorder, unspecified: Secondary | ICD-10-CM | POA: Diagnosis present

## 2019-12-03 DIAGNOSIS — F1290 Cannabis use, unspecified, uncomplicated: Secondary | ICD-10-CM | POA: Diagnosis present

## 2019-12-03 DIAGNOSIS — G4700 Insomnia, unspecified: Secondary | ICD-10-CM | POA: Diagnosis present

## 2019-12-03 DIAGNOSIS — K219 Gastro-esophageal reflux disease without esophagitis: Secondary | ICD-10-CM | POA: Diagnosis present

## 2019-12-03 DIAGNOSIS — R45850 Homicidal ideations: Secondary | ICD-10-CM | POA: Diagnosis present

## 2019-12-03 DIAGNOSIS — Z79899 Other long term (current) drug therapy: Secondary | ICD-10-CM | POA: Diagnosis not present

## 2019-12-03 DIAGNOSIS — Z818 Family history of other mental and behavioral disorders: Secondary | ICD-10-CM | POA: Diagnosis not present

## 2019-12-03 DIAGNOSIS — R45851 Suicidal ideations: Secondary | ICD-10-CM | POA: Diagnosis present

## 2019-12-03 DIAGNOSIS — Z20822 Contact with and (suspected) exposure to covid-19: Secondary | ICD-10-CM | POA: Diagnosis present

## 2019-12-03 DIAGNOSIS — F329 Major depressive disorder, single episode, unspecified: Secondary | ICD-10-CM | POA: Diagnosis present

## 2019-12-03 MED ORDER — ALUM & MAG HYDROXIDE-SIMETH 200-200-20 MG/5ML PO SUSP: 30.0000 mL | ORAL | Status: DC | PRN

## 2019-12-03 MED ORDER — HYDROXYZINE HCL 25 MG PO TABS
25.0000 mg | ORAL_TABLET | Freq: Three times a day (TID) | ORAL | Status: DC | PRN
  Administered 2019-12-04 – 2019-12-06 (×5): 25 mg via ORAL
  Filled 2019-12-03 (×5): qty 1

## 2019-12-03 MED ORDER — ACETAMINOPHEN 325 MG PO TABS: 650.0000 mg | ORAL_TABLET | Freq: Four times a day (QID) | ORAL | Status: DC | PRN

## 2019-12-03 MED ORDER — MAGNESIUM HYDROXIDE 400 MG/5ML PO SUSP: 30.0000 mL | Freq: Every day | ORAL | Status: DC | PRN

## 2019-12-03 MED ORDER — TRAZODONE HCL 50 MG PO TABS
50.0000 mg | ORAL_TABLET | Freq: Every evening | ORAL | Status: DC | PRN
  Administered 2019-12-03 – 2019-12-05 (×3): 50 mg via ORAL
  Filled 2019-12-03 (×4): qty 1

## 2019-12-03 MED ORDER — OLANZAPINE 5 MG PO TBDP
5.0000 mg | ORAL_TABLET | Freq: Every day | ORAL | Status: DC
  Administered 2019-12-03: 5 mg via ORAL
  Filled 2019-12-03 (×4): qty 1

## 2019-12-03 NOTE — Progress Notes (Signed)
Pt was reluctant with taking medications, but writer explained the medications and pt agreed to try them . Pt stated the Zyprexa made her " feel some type of way". Pt encouraged to try tonight and tell us how she felt in the morning.

## 2019-12-03 NOTE — Progress Notes (Signed)
Pt resting in bed through night with with eyes closed unlabored respirations. Safety maintained with q15 min rounds. Monitoring continues.
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
## 2019-12-03 NOTE — Progress Notes (Signed)
   12/03/19 2200  Psych Admission Type (Psych Patients Only)  Admission Status Voluntary  Psychosocial Assessment  Patient Complaints Anxiety  Eye Contact Brief  Facial Expression Flat;Sad  Affect Apprehensive;Preoccupied  Speech Logical/coherent;Soft  Interaction Avoidant;Guarded;Cautious  Motor Activity Fidgety  Appearance/Hygiene In scrubs;Disheveled  Behavior Characteristics Cooperative  Mood Anxious;Preoccupied  Thought Process  Coherency WDL  Content Preoccupation  Delusions None reported or observed  Perception Hallucinations  Hallucination Auditory  Judgment Limited  Confusion WDL  Danger to Self  Current suicidal ideation? Denies  Danger to Others  Danger to Others None reported or observed  Danger to Others Abnormal  Harmful Behavior to others No threats or harm toward other people  Destructive Behavior No threats or harm toward property

## 2019-12-03 NOTE — Progress Notes (Signed)
The patient's positive event for today is that she had a good appetite. Her goal for tomorrow is to come out of her room more.

## 2019-12-03 NOTE — Progress Notes (Signed)
Pt transferred to room 500-2. MHT oriented pt to unit, room, and staff members.  Pt is in her room resting comfortably at this time.
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
## 2019-12-03 NOTE — BH Assessment (Signed)
[place] Assessment Progress Note  Per Myles Lipps, MD, this voluntary pt requires psychiatric hospitalization at this time.  Jasmine has assigned pt to [facility] Rm [id number].  Pt's nurse has been notified.  Jalene Mullet, [place] Coordinator [phone]
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
## 2019-12-03 NOTE — Progress Notes (Signed)
EKG COMPLETE AND GIVEN TO MD FOR REVIEW!

## 2019-12-04 DIAGNOSIS — F19959 Other psychoactive substance use, unspecified with psychoactive substance-induced psychotic disorder, unspecified: Principal | ICD-10-CM

## 2019-12-04 MED ORDER — OLANZAPINE 10 MG PO TABS
10.0000 mg | ORAL_TABLET | Freq: Every day | ORAL | Status: DC
  Administered 2019-12-04 – 2019-12-05 (×2): 10 mg via ORAL
  Filled 2019-12-04 (×5): qty 1

## 2019-12-04 MED ORDER — FLUOXETINE HCL 20 MG PO CAPS
20.0000 mg | ORAL_CAPSULE | Freq: Every day | ORAL | Status: DC
  Administered 2019-12-04 – 2019-12-09 (×6): 20 mg via ORAL
  Filled 2019-12-04 (×3): qty 1
  Filled 2019-12-04: qty 7
  Filled 2019-12-04 (×4): qty 1

## 2019-12-04 NOTE — Progress Notes (Signed)
   12/04/19 2130  Psych Admission Type (Psych Patients Only)  Admission Status Voluntary  Psychosocial Assessment  Patient Complaints Anxiety;Other (Comment) (racing thoughts)  Eye Contact Brief  Facial Expression Anxious;Pensive  Affect Anxious;Apprehensive;Preoccupied  Speech Logical/coherent;Soft  Interaction Cautious;Forwards little;Guarded;Minimal  Motor Activity Fidgety;Restless  Appearance/Hygiene In scrubs  Behavior Characteristics Cooperative;Anxious  Mood Depressed;Anxious  Thought Process  Coherency WDL  Content Preoccupation  Delusions None reported or observed  Perception WDL  Hallucination None reported or observed  Judgment Poor  Confusion None  Danger to Self  Current suicidal ideation? Denies  Danger to Others  Danger to Others None reported or observed   Pt seen at med window. Preoccupied with her medication in particular, her Zyprexa. Given yellow orally disintegrating pill the night before. Given order for regular PO Zyprexa tonight and anxious about it working since it is not in the same form as before.

## 2019-12-04 NOTE — BHH Suicide Risk Assessment (Signed)
[place] INPATIENT:  Family/Significant Other Suicide Prevention Education  Suicide Prevention Education:  Education Completed; mother, Nikkia Ruotolo [phone] has been identified by the patient as the family member/significant other with whom the patient will be residing, and identified as the person(s) who will aid the patient in the event of a mental health crisis (suicidal ideations/suicide attempt).  With written consent from the patient, the family member/significant other has been provided the following suicide prevention education, prior to the and/or following the discharge of the patient.  The suicide prevention education provided includes the following:  Suicide risk factors  Suicide prevention and interventions  National Suicide Hotline telephone number  [facility] assessment telephone number  [facility] Emergency Assistance [place] and/or Residential Mobile Crisis Unit telephone number  Request made of family/significant other to:  Remove weapons (e.g., guns, rifles, knives), all items previously/currently identified as safety concern.    Remove drugs/medications (over-the-counter, prescriptions, illicit drugs), all items previously/currently identified as a safety concern.  The family member/significant other verbalizes understanding of the suicide prevention education information provided.  The family member/significant other agrees to remove the items of safety concern listed above.  Patient lives with her mother, mother voices no home safety concerns regarding discharge. Mother asked CSW to review medication list and asked to be informed of discharge planning.  Mother also inquired about FMLA or other work documentation, CSW provided the fax number for the [place] office.  No other questions or concerns at this time.   Joellen Jersey 12/04/2019, 2:38 PM

## 2019-12-04 NOTE — Progress Notes (Signed)
The patient learned today how to talk about some of her bad experiences.Her goal for tomorrow is to open up more.

## 2019-12-04 NOTE — BHH Group Notes (Signed)
[facility] Group Therapy 12/04/2019 2:47 PM  Type of Therapy: Mental Health Association Presentation  Participation Level: Active  Participation Quality: Attentive  Affect: Appropriate  Cognitive: Oriented  Insight: Developing/Improving  Engagement in Therapy: Engaged  Modes of Intervention: Discussion, Education and Socialization   Summary of Progress/Problems: Sandra Macdonald ([place]) Speaker came to talk about his personal journey with mental health. The pt processed ways by which to relate to the speaker. [place] speaker provided handouts and educational information pertaining to groups and services offered by the [facility]. Pt was engaged in speaker's presentation and was receptive to resources provided.   Stephanie Acre, MSW, [facility] 12/04/2019 2:47 PM

## 2019-12-04 NOTE — Plan of Care (Signed)
Progress note  D: pt found in bed; allowed to rest. Upon approach pt is guarded and fidgety. Pt seems paranoid with this writers questions, stating, "why does everyone keep asking me the same questions". Pt was pleasant though. Pt had requests for their medication administration record which was printed. Pt denies any physical complaints or pain. Pt did laugh inappropriately a few times during assessment. Pt denies si/hi/ah/vh though, and verbally agrees to approach staff if these become apparent or before harming themself/others while at [place].  A: Pt provided support and encouragement. Pt given medication per protocol and standing orders. Q13m safety checks implemented and continued.  R: Pt safe on the unit. Will continue to monitor.  Pt progressing in the following metrics   Problem: Education: Goal: Knowledge of [place] [facility] Education information/materials will improve Outcome: Progressing Goal: Emotional status will improve Outcome: Progressing Goal: Mental status will improve Outcome: Progressing Goal: Verbalization of understanding the information provided will improve Outcome: Progressing

## 2019-12-04 NOTE — Progress Notes (Signed)
[facility] MD Progress Note  12/04/2019 1:20 PM Sandra Macdonald  MRN:  [id number]   Subjective: Follow-up for this 24 year old female diagnosed with psychoactive substance induced psychosis.  Patient reports that she is feeling much better today.  She states that the medication that she took last night did seem to help her but she was concerned about side effects.  She states that she is tolerating it well.  She denies having any continued suicidal or homicidal ideations and denies any hallucinations today.  She reports that she has been having problems at home for quite some time, at least the last several months.  She stated the voices were becoming so bad at home that she was trying to find ways to block out the noise such as putting a fan by her ear so that she would hear the voices and would block out other noises so she could sleep some.  She reports that she is concerned on whether it was due to her substance or if it is due to her hereditary and reports that her mother has been diagnosed with paranoid schizophrenia and has been at the [facility] multiple times.  She is questioning about her medications and what she can take to assist with her depression.  She does report having some severe depressive days due to her miscarriages in the past with the last one being in March 2020.  Principal Problem: Psychoactive substance-induced psychosis ([place]) Diagnosis: Principal Problem:   Psychoactive substance-induced psychosis ([place]) Active Problems:   Psychosis ([place])  Total Time spent with patient: 30 minutes  Past Psychiatric History: Patient denied any previous psychiatric evaluations, psychiatric treatment or psychiatric admissions.  Past Medical History:  Past Medical History:  Diagnosis Date  . Depression   . Fibroid   . G6PD deficiency   . Supervision of normal first pregnancy, antepartum 02/18/2018    Nursing Staff Provider Office Location  [facility] Dating  LMP/6 week [place]  Language   English  Anatomy US   Flu  Vaccine  n/a Genetic Screen  NIPS: low risk, female  AFP:   TDaP vaccine    Hgb A1C or  GTT Early  Third trimester  Rhogam     LAB RESULTS  Feeding Plan Breast Blood Type A/Positive/-- (07/15 0942)  Contraception  Antibody Negative (07/15 0942) Circumcision  Rubella 3.00 (07/15 [id number]) Pediatricia    Past Surgical History:  Procedure Laterality Date  . NO PAST SURGERIES     Family History:  Family History  Problem Relation Age of Onset  . Mental illness Mother   . ADD / ADHD Neg Hx   . Anxiety disorder Neg Hx   . Alcohol abuse Neg Hx   . Arthritis Neg Hx   . Asthma Neg Hx   . Cancer Neg Hx   . Birth defects Neg Hx   . Depression Neg Hx   . COPD Neg Hx   . Diabetes Neg Hx   . Drug abuse Neg Hx   . Early death Neg Hx   . Hearing loss Neg Hx   . Heart disease Neg Hx   . Hyperlipidemia Neg Hx   . Hypertension Neg Hx   . Intellectual disability Neg Hx   . Kidney disease Neg Hx   . Learning disabilities Neg Hx   . Miscarriages / Stillbirths Neg Hx   . Stroke Neg Hx   . Obesity Neg Hx   . Vision loss Neg Hx   . Varicose Veins Neg Hx  Family Psychiatric  History: reports that her mother has been at [facility] mul;tiple times and is diagnosed as paranoid schizophrenic and bipolar disorder  Social History:  Social History   Substance and Sexual Activity  Alcohol Use Yes  . Alcohol/week: 1.0 standard drinks  . Types: 1 Standard drinks or equivalent per week   Comment: per pt drinks socially 1-2x month on average     Social History   Substance and Sexual Activity  Drug Use Yes  . Frequency: 14.0 times per week  . Types: Marijuana   Comment: pt reports 1-2 blunts on work days, more on days off    Social History   Socioeconomic History  . Marital status: Single    Spouse name: Not on file  . Number of children: Not on file  . Years of education: Not on file  . Highest education level: 10th grade  Occupational History  . Not on file  Tobacco Use  . Smoking status: Never  Smoker  . Smokeless tobacco: Never Used  Substance and Sexual Activity  . Alcohol use: Yes    Alcohol/week: 1.0 standard drinks    Types: 1 Standard drinks or equivalent per week    Comment: per pt drinks socially 1-2x month on average  . Drug use: Yes    Frequency: 14.0 times per week    Types: Marijuana    Comment: pt reports 1-2 blunts on work days, more on days off  . Sexual activity: Not Currently    Birth control/protection: None  Other Topics Concern  . Not on file  Social History Narrative  . Not on file   Social Determinants of Health   Financial Resource Strain:   . Difficulty of Paying Living Expenses:   Food Insecurity:   . Worried About Charity fundraiser in the Last Year:   . Arboriculturist in the Last Year:   Transportation Needs:   . Film/video editor (Medical):   Marland Kitchen Lack of Transportation (Non-Medical):   Physical Activity:   . Days of Exercise per Week:   . Minutes of Exercise per Session:   Stress:   . Feeling of Stress :   Social Connections:   . Frequency of Communication with Friends and Family:   . Frequency of Social Gatherings with Friends and Family:   . Attends Religious Services:   . Active Member of Clubs or Organizations:   . Attends Archivist Meetings:   Marland Kitchen Marital Status:    Additional Social History:                         Sleep: Good  Appetite:  Fair  Current Medications: Current Facility-Administered Medications  Medication Dose Route Frequency Provider Last Rate Last Admin  . acetaminophen (TYLENOL) tablet 650 mg  650 mg Oral Q6H PRN Sharma Covert, MD      . alum & mag hydroxide-simeth (MAALOX/MYLANTA) 200-200-20 MG/5ML suspension 30 mL  30 mL Oral Q4H PRN Sharma Covert, MD      . FLUoxetine (PROZAC) capsule 20 mg  20 mg Oral Daily Ignace Mandigo, Lowry Ram, [place]      . hydrOXYzine (ATARAX/VISTARIL) tablet 25 mg  25 mg Oral TID PRN Sharma Covert, MD      . magnesium hydroxide (MILK OF MAGNESIA)  suspension 30 mL  30 mL Oral Daily PRN Sharma Covert, MD      . OLANZapine [facility]) tablet 10 mg  10 mg Oral QHS Brieanna Nau, Lowry Ram, FNP      . traZODone (DESYREL) tablet 50 mg  50 mg Oral QHS PRN Sharma Covert, MD   50 mg at 12/03/19 2120    Lab Results:  Results for orders placed or performed during the hospital encounter of 12/02/19 (from the past 48 hour(s))  Respiratory Panel by RT PCR (Flu A&B, Covid) - Nasopharyngeal Swab     Status: None   Collection Time: 12/02/19  3:07 PM   Specimen: Nasopharyngeal Swab  Result Value Ref Range   SARS Coronavirus 2 by RT PCR NEGATIVE NEGATIVE    Comment: (NOTE) SARS-CoV-2 target nucleic acids are NOT DETECTED. The SARS-CoV-2 RNA is generally detectable in upper respiratoy specimens during the acute phase of infection. The lowest concentration of SARS-CoV-2 viral copies this assay can detect is 131 copies/mL. A negative result does not preclude SARS-Cov-2 infection and should not be used as the sole basis for treatment or other patient management decisions. A negative result may occur with  improper specimen collection/handling, submission of specimen other than nasopharyngeal swab, presence of viral mutation(s) within the areas targeted by this assay, and inadequate number of viral copies (<131 copies/mL). A negative result must be combined with clinical observations, patient history, and epidemiological information. The expected result is Negative. Fact Sheet for Patients:  PinkCheek.be Fact Sheet for Healthcare Providers:  GravelBags.it This test is not yet ap proved or cleared by the [place] FDA and  has been authorized for detection and/or diagnosis of SARS-CoV-2 by FDA under an Emergency Use Authorization (EUA). This EUA will remain  in effect (meaning this test can be used) for the duration of the COVID-19 declaration under Section 564(b)(1) of the Act, 21  U.S.C. section 360bbb-3(b)(1), unless the authorization is terminated or revoked sooner.    Influenza A by PCR NEGATIVE NEGATIVE   Influenza B by PCR NEGATIVE NEGATIVE    Comment: (NOTE) The Xpert Xpress SARS-CoV-2/FLU/RSV assay is intended as an aid in  the diagnosis of influenza from Nasopharyngeal swab specimens and  should not be used as a sole basis for treatment. Nasal washings and  aspirates are unacceptable for Xpert Xpress SARS-CoV-2/FLU/RSV  testing. Fact Sheet for Patients: PinkCheek.be Fact Sheet for Healthcare Providers: GravelBags.it This test is not yet approved or cleared by the [place] FDA and  has been authorized for detection and/or diagnosis of SARS-CoV-2 by  FDA under an Emergency Use Authorization (EUA). This EUA will remain  in effect (meaning this test can be used) for the duration of the  Covid-19 declaration under Section 564(b)(1) of the Act, 21  U.S.C. section 360bbb-3(b)(1), unless the authorization is  terminated or revoked. Performed at [facility], [place] [address] [place], [place] [address]     Blood Alcohol level:  Lab Results  Component Value Date   ETH <10 [id number]    Metabolic Disorder Labs: Lab Results  Component Value Date   HGBA1C <4.2 (L) 09/16/2018   No results found for: PROLACTIN No results found for: CHOL, TRIG, HDL, CHOLHDL, VLDL, LDLCALC  Physical Findings: AIMS: Facial and Oral Movements Muscles of Facial Expression: None, normal Lips and Perioral Area: None, normal Jaw: None, normal Tongue: None, normal,Extremity Movements Upper (arms, wrists, hands, fingers): None, normal Lower (legs, knees, ankles, toes): None, normal, Trunk Movements Neck, shoulders, hips: None, normal, Overall Severity Severity of abnormal movements (highest score from questions above): None, normal Incapacitation due to abnormal movements: None, normal Patient's  awareness  of abnormal movements (rate only patient's report): No Awareness, Dental Status Current problems with teeth and/or dentures?: No Does patient usually wear dentures?: No  CIWA:  CIWA-Ar Total: 2 COWS:     Musculoskeletal: Strength & Muscle Tone: within normal limits Gait & Station: normal Patient leans: N/A  Psychiatric Specialty Exam: Physical Exam  Nursing note and vitals reviewed. Constitutional: She is oriented to person, place, and time. She appears well-developed and well-nourished.  Respiratory: Effort normal.  Musculoskeletal:        General: Normal range of motion.  Neurological: She is alert and oriented to person, place, and time.  Skin: Skin is warm.    Review of Systems  Constitutional: Negative.   HENT: Negative.   Eyes: Negative.   Respiratory: Negative.   Cardiovascular: Negative.   Gastrointestinal: Negative.   Genitourinary: Negative.   Musculoskeletal: Negative.   Skin: Negative.   Neurological: Negative.   Psychiatric/Behavioral: The patient is nervous/anxious.     Blood pressure 109/64, pulse (!) 116, temperature 98.6 F (37 C), temperature source Oral, resp. rate 18, height 5\' 5"  (1.651 m), weight 72.6 kg, SpO2 100 %.Body mass index is 26.63 kg/m.  General Appearance: Disheveled  Eye Contact:  Fair  Speech:  Clear and Coherent and Normal Rate  Volume:  Decreased  Mood:  Anxious and Depressed  Affect:  Congruent and Inappropriate at times  Thought Process:  Coherent and Descriptions of Associations: Intact  Orientation:  Full (Time, Place, and Person)  Thought Content:  WDL  Suicidal Thoughts:  No  Homicidal Thoughts:  No  Memory:  Immediate;   Fair Recent;   Fair Remote;   Fair  Judgement:  Fair  Insight:  Fair  Psychomotor Activity:  Normal  Concentration:  Concentration: Fair  Recall:  [employer] of Knowledge:  Fair  Language:  Fair  Akathisia:  No  Handed:  Right  AIMS (if indicated):     Assets:  Communication  Skills Desire for Improvement Financial Resources/Insurance Housing Social Support  ADL's:  Intact  Cognition:  WNL  Sleep:  Number of Hours: 6   Assessment: Patient presents in her room and is pleasant, calm, cooperative.  Patient still has some inappropriate affect such as when asked about her miscarried she laughs a little before answering the question.  She has reported some depression and anxiety still but reports that the hallucinations are gone.  She agrees to continue the Zyprexa and is informed about the side effects and benefits of the medication she is currently taking.  Discussed her the possibility of starting Prozac to assist with some of her depression and anxiety and she is in agreement with this.  Patient is presenting much more improved since she was admitted and feel that potentially she may be discharging the next 2 to 3 days.  Treatment Plan Summary: Daily contact with patient to assess and evaluate symptoms and progress in treatment and Medication management Increase Zyprexa 10 mg p.o. nightly for psychotic symptoms Continue trazodone 50 mg p.o. nightly as needed for insomnia Continue Vistaril 25 mg p.o. 3 times daily as needed for anxiety Start Prozac 20 mg p.o. daily for depression and anxiety Encourage group therapy participation Continue every 15 minute safety checks  Lowry Ram Kindel Rochefort, FNP 12/04/2019, 1:20 PM

## 2019-12-04 NOTE — Progress Notes (Signed)
Recreation Therapy Notes  Date: 4.29.21 Time: 1000 Location: 500 [address] Dayroom  Group Topic: Wellness  Goal Area(s) Addresses:  Patient will define components of whole wellness. Patient will verbalize benefit of whole wellness.  Intervention: Music   Activity: Exercise.  LRT led group in a series of stretches to loosen up the body.  Each patient then led the group in an exercise of their choosing.  Patients were encouraged to take breaks and drink water as needed.  Education: Wellness, Dentist.   Education Outcome: Acknowledges education/In group clarification offered/Needs additional education.   Clinical Observations/Feedback: Pt did not attend group session.     Victorino Sparrow, LRT/CTRS         Ria Comment, Arav Bannister A 12/04/2019 11:03 AM
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
## 2019-12-04 NOTE — Progress Notes (Signed)
This Probation officer called to [employer] because this pt agitated and throwing things around the hall. Entered to find all the towels thrown in the floor and water pitcher spilled on the floor. Pt clearly agitated and breathing heavily, saying "I can't breathe. I feel closed in. Can't you feel it? I have to get out of here. Please let me out of here." Pt tearful and pacing the floor. Pt asked the reason for her panic and she couldn't answer. Pt assured that she was breathing. Pt walked through deep breathing exercises. Pt assured that during the 15 minute checks, staff would make sure that she was still breathing. Pt encouraged to sit down and let the medication begin to work. Pt walked back to her room and sat on her bed. Pt still tearful but willing to try and let her medication work to calm her down. Will continue to monitor.

## 2019-12-04 NOTE — Progress Notes (Signed)
Recreation Therapy Notes  INPATIENT RECREATION THERAPY ASSESSMENT  Patient Details Name: Sandra Macdonald MRN: [id number] DOB: 1995/12/22 Today's Date: 12/04/2019       Information Obtained From: Patient  Able to Participate in Assessment/Interview: Yes  Patient Presentation: Alert  Reason for Admission (Per Patient): Other (Comments)(Hearing voices)  Patient Stressors: Work  Radiographer, therapeutic:   Building control surveyor, Arguments, Music, Deep Breathing, Substance Abuse, Impulsivity, Talk, Art, Avoidance, Read, Hot Bath/Shower  Leisure Interests (2+):  Art - Draw, Art - Coloring, Individual - Other (Comment)(Youtube; Sleep)  Frequency of Recreation/Participation: Other (Comment)(Draw/Color- Weekly; Youtube, Sleep- Daily)  Awareness of Community Resources:  Yes  Community Resources:  Other (Comment)(Stores)  Current Use: Yes  If no, Barriers?:    Expressed Interest in [employer] Information: No  [place] of Residence:  [place]  Patient Main Form of Transportation: Uber/Lyft  Patient Strengths:  Ignoring people  Patient Identified Areas of Improvement:  Not lashing out when angry; Patience  Patient Goal for Hospitalization:  "to not be in bed all day"  Current SI (including self-harm):  No  Current HI:  No  Current AVH: No  Staff Intervention Plan: Group Attendance, Collaborate with Interdisciplinary Treatment Team  Consent to Intern Participation: N/A    Victorino Sparrow, LRT/CTRS  Victorino Sparrow A 12/04/2019, 11:22 AM

## 2019-12-05 NOTE — BHH Group Notes (Signed)
LCSW Aftercare Discharge Planning Group Note   12/05/2019 1100am  Type of Group and Topic: Psychoeducational Group:  Discharge Planning  Participation Level:  Active  Description of Group  Discharge planning group reviews patient's anticipated discharge plans and assists patients to anticipate and address any barriers to wellness/recovery in the community.  Suicide prevention education is reviewed with patients in group.  Therapeutic Goals 1. Patients will state their anticipated discharge plan and mental health aftercare 2. Patients will identify potential barriers to wellness in the community setting 3. Patients will engage in problem solving, solution focused discussion of ways to anticipate and address barriers to wellness/recovery  Summary of Patient Progress: Pt very active in group discussion regarding wellness.  Pt read aloud to the group from the handout and was attentive throughout. Pt stated that spiritual and intellectual were wellness areas she was doing well in, emotional is an area she needs to improve on.     Plan for Discharge/Comments:    Transportation Means:   Supports:  Therapeutic Modalities: Motivational Interviewing    Joanne Chars, LCSW 12/05/2019 2:13 PM
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
## 2019-12-05 NOTE — Tx Team (Signed)
Interdisciplinary Treatment and Diagnostic Plan Update  12/05/2019 Time of Session: 0900 Sandra Macdonald MRN: [id number]  Principal Diagnosis: Psychoactive substance-induced psychosis ([place])  Secondary Diagnoses: Principal Problem:   Psychoactive substance-induced psychosis ([place]) Active Problems:   Psychosis ([place])   Current Medications:  Current Facility-Administered Medications  Medication Dose Route Frequency Provider Last Rate Last Admin  . acetaminophen (TYLENOL) tablet 650 mg  650 mg Oral Q6H PRN Sharma Covert, MD      . alum & mag hydroxide-simeth (MAALOX/MYLANTA) 200-200-20 MG/5ML suspension 30 mL  30 mL Oral Q4H PRN Sharma Covert, MD      . FLUoxetine (PROZAC) capsule 20 mg  20 mg Oral Daily Money, Lowry Ram, FNP   20 mg at 12/05/19 0746  . hydrOXYzine (ATARAX/VISTARIL) tablet 25 mg  25 mg Oral TID PRN Sharma Covert, MD   25 mg at 12/05/19 0332  . magnesium hydroxide (MILK OF MAGNESIA) suspension 30 mL  30 mL Oral Daily PRN Sharma Covert, MD      . OLANZapine [facility]) tablet 10 mg  10 mg Oral QHS Money, Lowry Ram, FNP   10 mg at 12/04/19 2051  . traZODone (DESYREL) tablet 50 mg  50 mg Oral QHS PRN Sharma Covert, MD   50 mg at 12/04/19 2051   PTA Medications: Medications Prior to Admission  Medication Sig Dispense Refill Last Dose  . acetaminophen (TYLENOL) 500 MG tablet Take 1,000 mg by mouth every 6 (six) hours as needed for moderate pain.     Marland Kitchen oxyCODONE (ROXICODONE) 5 MG immediate release tablet Take 1 tablet (5 mg total) by mouth every 4 (four) hours as needed for severe pain. (Patient not taking: Reported on 11/21/2018) 15 tablet 0     Patient Stressors:    Patient Strengths:    Treatment Modalities: Medication Management, Group therapy, Case management,  1 to 1 session with clinician, Psychoeducation, Recreational therapy.   Physician Treatment Plan for Primary Diagnosis: Psychoactive substance-induced psychosis ([place]) Long Term Goal(s):  Improvement in symptoms so as ready for discharge Improvement in symptoms so as ready for discharge   Short Term Goals: Ability to identify changes in lifestyle to reduce recurrence of condition will improve Ability to verbalize feelings will improve Ability to disclose and discuss suicidal ideas Ability to demonstrate self-control will improve Ability to identify and develop effective coping behaviors will improve Ability to maintain clinical measurements within normal limits will improve Ability to identify triggers associated with substance abuse/mental health issues will improve Ability to identify changes in lifestyle to reduce recurrence of condition will improve Ability to verbalize feelings will improve Ability to disclose and discuss suicidal ideas Ability to demonstrate self-control will improve Ability to identify and develop effective coping behaviors will improve Ability to maintain clinical measurements within normal limits will improve Ability to identify triggers associated with substance abuse/mental health issues will improve  Medication Management: Evaluate patient's response, side effects, and tolerance of medication regimen.  Therapeutic Interventions: 1 to 1 sessions, Unit Group sessions and Medication administration.  Evaluation of Outcomes: Progressing  Physician Treatment Plan for Secondary Diagnosis: Principal Problem:   Psychoactive substance-induced psychosis ([place]) Active Problems:   Psychosis ([place])  Long Term Goal(s): Improvement in symptoms so as ready for discharge Improvement in symptoms so as ready for discharge   Short Term Goals: Ability to identify changes in lifestyle to reduce recurrence of condition will improve Ability to verbalize feelings will improve Ability to disclose and discuss suicidal ideas Ability to demonstrate  self-control will improve Ability to identify and develop effective coping behaviors will improve Ability to maintain  clinical measurements within normal limits will improve Ability to identify triggers associated with substance abuse/mental health issues will improve Ability to identify changes in lifestyle to reduce recurrence of condition will improve Ability to verbalize feelings will improve Ability to disclose and discuss suicidal ideas Ability to demonstrate self-control will improve Ability to identify and develop effective coping behaviors will improve Ability to maintain clinical measurements within normal limits will improve Ability to identify triggers associated with substance abuse/mental health issues will improve     Medication Management: Evaluate patient's response, side effects, and tolerance of medication regimen.  Therapeutic Interventions: 1 to 1 sessions, Unit Group sessions and Medication administration.  Evaluation of Outcomes: Progressing   RN Treatment Plan for Primary Diagnosis: Psychoactive substance-induced psychosis ([place]) Long Term Goal(s): Knowledge of disease and therapeutic regimen to maintain health will improve  Short Term Goals: Ability to identify and develop effective coping behaviors will improve and Compliance with prescribed medications will improve  Medication Management: RN will administer medications as ordered by provider, will assess and evaluate patient's response and provide education to patient for prescribed medication. RN will report any adverse and/or side effects to prescribing provider.  Therapeutic Interventions: 1 on 1 counseling sessions, Psychoeducation, Medication administration, Evaluate responses to treatment, Monitor vital signs and CBGs as ordered, Perform/monitor CIWA, COWS, AIMS and Fall Risk screenings as ordered, Perform wound care treatments as ordered.  Evaluation of Outcomes: Progressing   LCSW Treatment Plan for Primary Diagnosis: Psychoactive substance-induced psychosis ([place]) Long Term Goal(s): Safe transition to appropriate next  level of care at discharge, Engage patient in therapeutic group addressing interpersonal concerns.  Short Term Goals: Engage patient in aftercare planning with referrals and resources, Increase social support and Increase skills for wellness and recovery  Therapeutic Interventions: Assess for all discharge needs, 1 to 1 time with Social worker, Explore available resources and support systems, Assess for adequacy in community support network, Educate family and significant other(s) on suicide prevention, Complete Psychosocial Assessment, Interpersonal group therapy.  Evaluation of Outcomes: Progressing   Progress in Treatment: Attending groups: Yes. Participating in groups: Yes. Taking medication as prescribed: Yes. Toleration medication: Yes. Family/Significant other contact made: Yes, individual(s) contacted:  mother Patient understands diagnosis: Yes. Discussing patient identified problems/goals with staff: Yes. Medical problems stabilized or resolved: Yes. Denies suicidal/homicidal ideation: Yes. Issues/concerns per patient self-inventory: No. Other: none  New problem(s) identified: No, Describe:  none  New Short Term/Long Term Goal(s):  Patient Goals:  Address the voices, less anxiety  Discharge Plan or Barriers:   Reason for Continuation of Hospitalization: Anxiety Depression Hallucinations Medication stabilization  Estimated Length of Stay:3-5 days.  Attendees: Patient: Sandra Macdonald 12/05/2019   Physician:  12/05/2019   Nursing: Neldon Newport, RN 12/05/2019   RN Care Manager: 12/05/2019   Social Worker: Lurline Idol, LCSW 12/05/2019   Recreational Therapist:  12/05/2019   Other: Marvia Pickles, RNP 12/05/2019   Other:  12/05/2019   Other: 12/05/2019     Scribe for Treatment Team: Joanne Chars, LCSW 12/05/2019 10:36 AM

## 2019-12-05 NOTE — Progress Notes (Signed)
[facility] MD Progress Note  12/05/2019 1:56 PM Sandra Macdonald  MRN:  [id number]   Subjective: Follow-up for this 24 year old female diagnosed with psychoactive substance induced psychosis.  Patient reports that she is doing a little bit better today except for having some anxiety last night she reports that another patient was bothering her and she felt like they were hitting on her and it made her feel anxious.  She states that she did not tell anyone about it and she was encouraged to do so if anyone made any other comments to her.  She stated that last night when she became anxious and stressed out that she did have some auditory hallucinations but they went away shortly thereafter.  Today she denies any suicidal or homicidal ideations and denies any hallucinations.  She does report some continued minor anxiety but feels that the Vistaril is helping her tremendously.  She states that she slept fair last night but because of that her anxiety it was difficult for sleep.  She states that her appetite is still been good.  She does report that she feels the medications are continuing to help her though.  Principal Problem: Psychoactive substance-induced psychosis ([place]) Diagnosis: Principal Problem:   Psychoactive substance-induced psychosis ([place]) Active Problems:   Psychosis ([place])  Total Time spent with patient: 20 minutes  Past Psychiatric History: Patient denied any previous psychiatric evaluations, psychiatric treatment or psychiatric admissions.  Past Medical History:  Past Medical History:  Diagnosis Date  . Depression   . Fibroid   . G6PD deficiency   . Supervision of normal first pregnancy, antepartum 02/18/2018    Nursing Staff Provider Office Location  [facility] Dating  LMP/6 week [place]  Language   English  Anatomy US   Flu Vaccine  n/a Genetic Screen  NIPS: low risk, female  AFP:   TDaP vaccine    Hgb A1C or  GTT Early  Third trimester  Rhogam     LAB RESULTS  Feeding Plan Breast Blood Type A/Positive/--  (07/15 0942)  Contraception  Antibody Negative (07/15 0942) Circumcision  Rubella 3.00 (07/15 [id number]) Pediatricia    Past Surgical History:  Procedure Laterality Date  . NO PAST SURGERIES     Family History:  Family History  Problem Relation Age of Onset  . Mental illness Mother   . ADD / ADHD Neg Hx   . Anxiety disorder Neg Hx   . Alcohol abuse Neg Hx   . Arthritis Neg Hx   . Asthma Neg Hx   . Cancer Neg Hx   . Birth defects Neg Hx   . Depression Neg Hx   . COPD Neg Hx   . Diabetes Neg Hx   . Drug abuse Neg Hx   . Early death Neg Hx   . Hearing loss Neg Hx   . Heart disease Neg Hx   . Hyperlipidemia Neg Hx   . Hypertension Neg Hx   . Intellectual disability Neg Hx   . Kidney disease Neg Hx   . Learning disabilities Neg Hx   . Miscarriages / Stillbirths Neg Hx   . Stroke Neg Hx   . Obesity Neg Hx   . Vision loss Neg Hx   . Varicose Veins Neg Hx    Family Psychiatric  History: reports that her mother has been at [facility] mul;tiple times and is diagnosed as paranoid schizophrenic and bipolar disorder Social History:  Social History   Substance and Sexual Activity  Alcohol Use Yes  . Alcohol/week:  1.0 standard drinks  . Types: 1 Standard drinks or equivalent per week   Comment: per pt drinks socially 1-2x month on average     Social History   Substance and Sexual Activity  Drug Use Yes  . Frequency: 14.0 times per week  . Types: Marijuana   Comment: pt reports 1-2 blunts on work days, more on days off    Social History   Socioeconomic History  . Marital status: Single    Spouse name: Not on file  . Number of children: Not on file  . Years of education: Not on file  . Highest education level: 10th grade  Occupational History  . Not on file  Tobacco Use  . Smoking status: Never Smoker  . Smokeless tobacco: Never Used  Substance and Sexual Activity  . Alcohol use: Yes    Alcohol/week: 1.0 standard drinks    Types: 1 Standard drinks or equivalent per week     Comment: per pt drinks socially 1-2x month on average  . Drug use: Yes    Frequency: 14.0 times per week    Types: Marijuana    Comment: pt reports 1-2 blunts on work days, more on days off  . Sexual activity: Not Currently    Birth control/protection: None  Other Topics Concern  . Not on file  Social History Narrative  . Not on file   Social Determinants of Health   Financial Resource Strain:   . Difficulty of Paying Living Expenses:   Food Insecurity:   . Worried About Charity fundraiser in the Last Year:   . Arboriculturist in the Last Year:   Transportation Needs:   . Film/video editor (Medical):   Marland Kitchen Lack of Transportation (Non-Medical):   Physical Activity:   . Days of Exercise per Week:   . Minutes of Exercise per Session:   Stress:   . Feeling of Stress :   Social Connections:   . Frequency of Communication with Friends and Family:   . Frequency of Social Gatherings with Friends and Family:   . Attends Religious Services:   . Active Member of Clubs or Organizations:   . Attends Archivist Meetings:   Marland Kitchen Marital Status:    Additional Social History:                         Sleep: Fair  Appetite:  Good  Current Medications: Current Facility-Administered Medications  Medication Dose Route Frequency Provider Last Rate Last Admin  . acetaminophen (TYLENOL) tablet 650 mg  650 mg Oral Q6H PRN Sharma Covert, MD      . alum & mag hydroxide-simeth (MAALOX/MYLANTA) 200-200-20 MG/5ML suspension 30 mL  30 mL Oral Q4H PRN Sharma Covert, MD      . FLUoxetine (PROZAC) capsule 20 mg  20 mg Oral Daily Jafet [place], Lowry Ram, FNP   20 mg at 12/05/19 0746  . hydrOXYzine (ATARAX/VISTARIL) tablet 25 mg  25 mg Oral TID PRN Sharma Covert, MD   25 mg at 12/05/19 0332  . magnesium hydroxide (MILK OF MAGNESIA) suspension 30 mL  30 mL Oral Daily PRN Sharma Covert, MD      . OLANZapine [facility]) tablet 10 mg  10 mg Oral QHS [place], Lowry Ram, FNP   10  mg at 12/04/19 2051  . traZODone (DESYREL) tablet 50 mg  50 mg Oral QHS PRN Sharma Covert, MD   50 mg  at 12/04/19 2051    Lab Results: No results found for this or any previous visit (from the past 48 hour(s)).  Blood Alcohol level:  Lab Results  Component Value Date   ETH <10 Q000111Q    Metabolic Disorder Labs: Lab Results  Component Value Date   HGBA1C <4.2 (L) 09/16/2018   No results found for: PROLACTIN No results found for: CHOL, TRIG, HDL, CHOLHDL, VLDL, LDLCALC  Physical Findings: AIMS: Facial and Oral Movements Muscles of Facial Expression: None, normal Lips and Perioral Area: None, normal Jaw: None, normal Tongue: None, normal,Extremity Movements Upper (arms, wrists, hands, fingers): None, normal Lower (legs, knees, ankles, toes): None, normal, Trunk Movements Neck, shoulders, hips: None, normal, Overall Severity Severity of abnormal movements (highest score from questions above): None, normal Incapacitation due to abnormal movements: None, normal Patient's awareness of abnormal movements (rate only patient's report): No Awareness, Dental Status Current problems with teeth and/or dentures?: No Does patient usually wear dentures?: No  CIWA:  CIWA-Ar Total: 2 COWS:     Musculoskeletal: Strength & Muscle Tone: within normal limits Gait & Station: normal Patient leans: N/A  Psychiatric Specialty Exam: Physical Exam  Nursing note and vitals reviewed. Constitutional: She is oriented to person, place, and time. She appears well-developed and well-nourished.  Respiratory: Effort normal.  Musculoskeletal:        General: Normal range of motion.  Neurological: She is alert and oriented to person, place, and time.  Skin: Skin is warm.  Psychiatric: Her mood appears anxious.    Review of Systems  Constitutional: Negative.   HENT: Negative.   Eyes: Negative.   Respiratory: Negative.   Cardiovascular: Negative.   Gastrointestinal: Negative.    Genitourinary: Negative.   Musculoskeletal: Negative.   Skin: Negative.   Neurological: Negative.   Psychiatric/Behavioral: The patient is nervous/anxious.     Blood pressure 109/64, pulse (!) 116, temperature 98.6 F (37 C), temperature source Oral, resp. rate 18, height 5\' 5"  (1.651 m), weight 72.6 kg, SpO2 100 %.Body mass index is 26.63 kg/m.  General Appearance: Disheveled  Eye Contact:  Good  Speech:  Clear and Coherent and Normal Rate  Volume:  Decreased  Mood:  Anxious  Affect:  Flat  Thought Process:  Coherent and Descriptions of Associations: Intact  Orientation:  Full (Time, Place, and Person)  Thought Content:  WDL  Suicidal Thoughts:  No  Homicidal Thoughts:  No  Memory:  Immediate;   Fair Recent;   Fair Remote;   Fair  Judgement:  Fair  Insight:  Fair  Psychomotor Activity:  Normal  Concentration:  Concentration: Fair  Recall:  [employer] of Knowledge:  Fair  Language:  Fair  Akathisia:  No  Handed:  Right  AIMS (if indicated):     Assets:  Desire for Improvement Financial Resources/Insurance Housing Physical Health Social Support  ADL's:  Intact  Cognition:  WNL  Sleep:  Number of Hours: 5.25   Assessment: Patient presents in her room lying in the bed but she is awake.  Patient is pleasant, calm, cooperative and appears to be anxious.  Patient's affect is flat today but she has denied any suicidal or homicidal ideations and denies any hallucinations.  Patient is encouraged to notify staff of any issues that she has including those issues with other patients.  She is informed that we would do her best to keep her safe and to stop other patients from bothering her and she is in agreement with this.  We will  continue all current medications as prescribed at this time.  It was discussed to move patient to [place] due to her improvement however patient informed staff that she felt safer staying on the [facility] at this time.  Patient will remain in her current  room with potential move at a later date.  Treatment Plan Summary: Daily contact with patient to assess and evaluate symptoms and progress in treatment and Medication management  Continue Prozac 20 mg p.o. daily for depression Continue Zyprexa 10 mg p.o. nightly for psychotic features Continue trazodone 50 mg p.o. nightly as needed for insomnia Continue Vistaril 25 mg p.o. 3 times daily as needed for anxiety Encourage group therapy participation Continue every 15 minute safety checks  Lewis Shock, FNP 12/05/2019, 1:56 PM

## 2019-12-05 NOTE — Progress Notes (Signed)
Recreation Therapy Notes  Date: 4.30.21 Time: 1000 Location: 500 Hall Dayroom  Group Topic: Communication, Team Building, Problem Solving  Goal Area(s) Addresses:  Patient will effectively work with peer towards shared goal.  Patient will identify skill used to make activity successful.  Patient will identify how skills used during activity can be used to reach post d/c goals.   Behavioral Response: Engaged  Intervention: STEM Activity   Activity: In team's, using 10 red plastic cups, patients were asked to flip the cups over and build a pyramid using the rubber band contraption provided to them.    Education: Education officer, community, Dentist.   Education Outcome: Acknowledges education/In group clarification offered/Needs additional education.   Clinical Observations/Feedback: Pt was bright and smiling during group.  Pt worked well with peers.  Pt expressed a few times that she was going to quit because they kept knocking the cups over and it was frustrating but regrouped and stayed with it.  Pt explained the group used to teamwork to complete the activity.  Pt was appropriate and active throughout group session.      Victorino Sparrow, LRT/CTRS    Victorino Sparrow A 12/05/2019 11:53 AM

## 2019-12-05 NOTE — Progress Notes (Signed)
   12/05/19 2040  Psych Admission Type (Psych Patients Only)  Admission Status Voluntary  Psychosocial Assessment  Patient Complaints None  Eye Contact Brief  Facial Expression Anxious;Animated  Affect Anxious;Appropriate to circumstance;Preoccupied  Speech Logical/coherent;Soft  Interaction Avoidant;Cautious;Guarded;Minimal;Isolative;No initiation  Motor Activity Fidgety  Appearance/Hygiene Improved  Behavior Characteristics Appropriate to situation;Cooperative  Mood Anxious;Preoccupied;Pleasant  Aggressive Behavior  Targets  (reports feeling of wanting to puinch mom as reason for observation)  Thought Process  Coherency WDL  Content Preoccupation  Delusions None reported or observed  Perception WDL  Hallucination Auditory  Judgment Poor  Confusion None  Danger to Self  Current suicidal ideation? Denies  Danger to Others  Danger to Others None reported or observed  Danger to Others Abnormal  Harmful Behavior to others No threats or harm toward other people  Destructive Behavior No threats or harm toward property
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
## 2019-12-06 MED ORDER — LORAZEPAM 2 MG/ML IJ SOLN: 2.0000 mg | Freq: Once | INTRAMUSCULAR | Status: AC

## 2019-12-06 MED ORDER — LORAZEPAM 2 MG/ML IJ SOLN
INTRAMUSCULAR | Status: AC
  Administered 2019-12-06: 2 mg via INTRAMUSCULAR
  Filled 2019-12-06: qty 1

## 2019-12-06 MED ORDER — PANTOPRAZOLE SODIUM 20 MG PO TBEC
20.0000 mg | DELAYED_RELEASE_TABLET | Freq: Every day | ORAL | Status: DC
  Administered 2019-12-06 – 2019-12-09 (×4): 20 mg via ORAL
  Filled 2019-12-06 (×3): qty 1
  Filled 2019-12-06: qty 7
  Filled 2019-12-06 (×2): qty 1

## 2019-12-06 MED ORDER — TRAZODONE HCL 100 MG PO TABS: 100.0000 mg | ORAL_TABLET | Freq: Every evening | ORAL | Status: DC | PRN

## 2019-12-06 MED ORDER — TRAZODONE HCL 50 MG PO TABS
50.0000 mg | ORAL_TABLET | Freq: Once | ORAL | Status: AC
  Administered 2019-12-06: 50 mg via ORAL
  Filled 2019-12-06: qty 1

## 2019-12-06 MED ORDER — OLANZAPINE 2.5 MG PO TABS: 2.5000 mg | ORAL_TABLET | Freq: Once | ORAL | Status: DC

## 2019-12-06 MED ORDER — OLANZAPINE 5 MG PO TBDP
15.0000 mg | ORAL_TABLET | Freq: Every day | ORAL | Status: DC
  Administered 2019-12-06: 15 mg via ORAL
  Filled 2019-12-06 (×2): qty 3

## 2019-12-06 MED ORDER — HYDROXYZINE HCL 25 MG PO TABS
25.0000 mg | ORAL_TABLET | Freq: Four times a day (QID) | ORAL | Status: DC | PRN
  Administered 2019-12-06 – 2019-12-08 (×5): 25 mg via ORAL
  Filled 2019-12-06: qty 10
  Filled 2019-12-06 (×5): qty 1

## 2019-12-06 MED ORDER — TRAZODONE HCL 100 MG PO TABS
100.0000 mg | ORAL_TABLET | Freq: Every day | ORAL | Status: DC
  Administered 2019-12-06 – 2019-12-08 (×3): 100 mg via ORAL
  Filled 2019-12-06: qty 1
  Filled 2019-12-06: qty 7
  Filled 2019-12-06 (×3): qty 1

## 2019-12-06 MED ORDER — LORAZEPAM 1 MG PO TABS
1.0000 mg | ORAL_TABLET | Freq: Once | ORAL | Status: AC
  Administered 2019-12-06: 1 mg via ORAL
  Filled 2019-12-06: qty 1

## 2019-12-06 MED ORDER — TRAZODONE HCL 50 MG PO TABS: 50.0000 mg | ORAL_TABLET | Freq: Every evening | ORAL | Status: DC | PRN

## 2019-12-06 MED ORDER — OLANZAPINE 7.5 MG PO TABS: 15.0000 mg | ORAL_TABLET | Freq: Every day | ORAL | Status: DC

## 2019-12-06 MED ORDER — OLANZAPINE 5 MG PO TBDP
2.5000 mg | ORAL_TABLET | Freq: Once | ORAL | Status: AC
  Administered 2019-12-06: 2.5 mg via ORAL
  Filled 2019-12-06: qty 0.5
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
## 2019-12-06 NOTE — Progress Notes (Signed)
Pt denied SI and HI.  Rated depression, hopelessness and anxiety as 5/5/7 out of 10 respectively.  Pt has been sleeping and staying in her room.  RN will encourage pt to spend time in the Dayroom and interact with peers.  Safety checks maintained every 15 minutes.

## 2019-12-06 NOTE — Progress Notes (Signed)
Pt came out of her room for lunch and went to the cafeteria to pick up her food.  Pt took medications as prescribed.  Pt asked for towel and washcloth to take a shower.

## 2019-12-06 NOTE — B[facility] Group Notes (Signed)
[facility] Group Notes: (Clinical Social Work)   12/06/2019      Type of Therapy:  Group Therapy   Participation Level:  Did Not Attend    Selmer Dominion, LCSW 12/06/2019, 12:06 PM

## 2019-12-06 NOTE — Progress Notes (Signed)
Adult Psychoeducational Group Note  Date:  12/06/2019 Time:  12:19 AM  Group Topic/Focus:  Wrap-Up Group:   The focus of this group is to help patients review their daily goal of treatment and discuss progress on daily workbooks.  Participation Level:  Minimal  Participation Quality:  Appropriate  Affect:  Appropriate  Cognitive:  Disorganized and Confused  Insight: Limited  Engagement in Group:  Limited  Modes of Intervention:  Discussion  Additional Comments:  Pt stated her goal for today was to focus on her treatment plan. Pt stated she felt she accomplished her goal today. Pt stated she felt her relationship with her family has improved since she was admitted here. Pt stated been able to contact her Mother today improved her overall day. Pt rated her overall day on a 5 out of 10. Pt stated her appetite was improved today. Pt stated her slept last night was poor. Pt nurse was made aware of the situation. Pt stated she was in no physical pain.Pt deny visual hallucinations. Pt admitted to having some issues with auditory, about 30 minutes ago. Writer asked pt was she experienceing it now. Pt stated no. Pt nurse was made aware of the situation. Pt denies thoughts of harming himself or others. Pt stated that he would alert staff if anything changes.  Sandra Macdonald 12/06/2019, 12:19 AM

## 2019-12-06 NOTE — Progress Notes (Signed)
Patient became agitated d/t the voices she reported hearing when she was trying to get some rest. She came out of her room with just her underwater on and flipped over the laundry basket, knocked towels onto the floor. Writer asked her to try and calm down so we could talk. She kept repeating " the voices are not getting any better." NP notified and [facility] Marie. Order received for 2mg  ativan IM. Patient agreed to take IM. Writer spoke with her and she reports that she is afraid that she is going to die. Writer assured her that she is safe here and we are here to help. She reported that she works and can not be on medicine that will have her sleeping all day. She also reports that her mother comes here often and told Probation officer her name. Writer continued to talk with her until she started to feel sleepy. She was given coloring sheets and shown deep breathing exercises for when she feels her anxiety building up. Safety maintained with 15 min checks. Will monitor effectiveness of medication given.

## 2019-12-06 NOTE — Progress Notes (Signed)
Pt is sitting in the dayroom, watching T.V. with her peers.

## 2019-12-06 NOTE — Progress Notes (Signed)
   12/06/19 1940  COVID-19 Daily Checkoff  Have you had a fever (temp > 37.80C/100F)  in the past 24 hours?  No  If you have had runny nose, nasal congestion, sneezing in the past 24 hours, has it worsened? No  COVID-19 EXPOSURE  Have you traveled outside the state in the past 14 days? No  Have you been in contact with someone with a confirmed diagnosis of COVID-19 or PUI in the past 14 days without wearing appropriate PPE? No  Have you been diagnosed with COVID-19? No

## 2019-12-07 LAB — CBC WITH DIFFERENTIAL/PLATELET
Abs Immature Granulocytes: 0.02 10*3/uL (ref 0.00–0.07)
Basophils Absolute: 0 10*3/uL (ref 0.0–0.1)
Basophils Relative: 1 %
Eosinophils Absolute: 0 10*3/uL (ref 0.0–0.5)
Eosinophils Relative: 0 %
HCT: 33.8 % — ABNORMAL LOW (ref 36.0–46.0)
Hemoglobin: 9.8 g/dL — ABNORMAL LOW (ref 12.0–15.0)
Immature Granulocytes: 0 %
Lymphocytes Relative: 28 %
Lymphs Abs: 1.8 10*3/uL (ref 0.7–4.0)
MCH: 21 pg — ABNORMAL LOW (ref 26.0–34.0)
MCHC: 29 g/dL — ABNORMAL LOW (ref 30.0–36.0)
MCV: 72.5 fL — ABNORMAL LOW (ref 80.0–100.0)
Monocytes Absolute: 0.8 10*3/uL (ref 0.1–1.0)
Monocytes Relative: 12 %
Neutro Abs: 3.9 10*3/uL (ref 1.7–7.7)
Neutrophils Relative %: 59 %
Platelets: 450 10*3/uL — ABNORMAL HIGH (ref 150–400)
RBC: 4.66 MIL/uL (ref 3.87–5.11)
RDW: 18.5 % — ABNORMAL HIGH (ref 11.5–15.5)
WBC: 6.6 10*3/uL (ref 4.0–10.5)
nRBC: 0 % (ref 0.0–0.2)

## 2019-12-07 MED ORDER — OLANZAPINE 10 MG PO TBDP
10.0000 mg | ORAL_TABLET | Freq: Three times a day (TID) | ORAL | Status: DC | PRN
  Administered 2019-12-08: 10 mg via ORAL
  Filled 2019-12-07: qty 1

## 2019-12-07 MED ORDER — LORAZEPAM 2 MG/ML IJ SOLN: 1.0000 mg | Freq: Three times a day (TID) | INTRAMUSCULAR | Status: DC | PRN

## 2019-12-07 MED ORDER — RISPERIDONE 3 MG PO TABS
3.0000 mg | ORAL_TABLET | Freq: Every day | ORAL | Status: DC
  Administered 2019-12-07: 3 mg via ORAL
  Filled 2019-12-07 (×2): qty 1

## 2019-12-07 MED ORDER — RISPERIDONE 1 MG PO TABS
1.0000 mg | ORAL_TABLET | Freq: Every day | ORAL | Status: DC
  Administered 2019-12-07 – 2019-12-09 (×3): 1 mg via ORAL
  Filled 2019-12-07: qty 1
  Filled 2019-12-07: qty 7
  Filled 2019-12-07 (×3): qty 1

## 2019-12-07 MED ORDER — ZIPRASIDONE MESYLATE 20 MG IM SOLR: 20.0000 mg | INTRAMUSCULAR | Status: DC | PRN

## 2019-12-07 MED ORDER — LORAZEPAM 1 MG PO TABS
1.0000 mg | ORAL_TABLET | Freq: Three times a day (TID) | ORAL | Status: DC | PRN
  Administered 2019-12-07 – 2019-12-08 (×2): 1 mg via ORAL
  Filled 2019-12-07 (×2): qty 1

## 2019-12-07 NOTE — BHH Group Notes (Signed)
[facility] Group Notes: (Clinical Social Work)   12/07/2019      Type of Therapy:  Group Therapy   Participation Level:  Did Not Attend - was invited both individually by [facility] and by overhead announcement, chose not to attend.   Selmer Dominion, LCSW 12/07/2019, 3:23 PM
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
## 2019-12-07 NOTE — Plan of Care (Signed)
Progress note  D: pt presented to the nurse's station for toiletries; compliant with medication administration. Pt continues to be guarded and minimal but is pleasant. Pt continues to complain of auditory hallucinations. Pt still seems groggy. Pt is resting now. Pt denies si/hi/vh and verbally agrees to approach staff if these become apparent or before harming themself/others while at [facility].  A: Pt provided support and encouragement. Pt given medication per protocol and standing orders. Q49m safety checks implemented and continued.  R: Pt safe on the unit. Will continue to monitor.  Pt progressing in the following metrics  Problem: Education: Goal: Ability to state activities that reduce stress will improve Outcome: Progressing   Problem: Coping: Goal: Ability to identify and develop effective coping behavior will improve Outcome: Progressing   Problem: Self-Concept: Goal: Ability to identify factors that promote anxiety will improve Outcome: Progressing Goal: Level of anxiety will decrease Outcome: Progressing

## 2019-12-07 NOTE — Progress Notes (Signed)
Writer spoke with patient briefly tonight. She has been isolative to her room other than to come out for ice or her HS medicine. She did report her day was better but she reports sleeping most of the day. She requested a few toiletries before returning to her room. Safety maintained with 15 min checks.

## 2019-12-08 MED ORDER — RISPERIDONE 2 MG PO TABS
2.0000 mg | ORAL_TABLET | Freq: Every day | ORAL | Status: DC
  Administered 2019-12-08: 2 mg via ORAL
  Filled 2019-12-08 (×3): qty 1

## 2019-12-08 NOTE — Progress Notes (Signed)
Pt anxious, was somatic  Some this evening, but pt did not appear to be in any distress at this time.     12/08/19 2300  Psych Admission Type (Psych Patients Only)  Admission Status Voluntary  Psychosocial Assessment  Patient Complaints Anxiety  Eye Contact Fair  Facial Expression Flat  Affect Appropriate to circumstance;Preoccupied  Speech Logical/coherent;Soft  Interaction Minimal;Isolative  Motor Activity Slow  Appearance/Hygiene Unremarkable  Behavior Characteristics Anxious  Mood Preoccupied;Anxious  Aggressive Behavior  Targets  (reports feeling of wanting to puinch mom as reason for observation)  Thought Process  Coherency WDL  Content Preoccupation;Paranoia  Delusions Paranoid  Perception Hallucinations  Hallucination Auditory  Judgment Poor  Confusion Mild  Danger to Self  Current suicidal ideation? Denies  Danger to Others  Danger to Others None reported or observed  Danger to Others Abnormal  Harmful Behavior to others No threats or harm toward other people  Destructive Behavior No threats or harm toward property

## 2019-12-08 NOTE — Progress Notes (Signed)
   12/07/19 2100  COVID-19 Daily Checkoff  Have you had a fever (temp > 37.80C/100F)  in the past 24 hours?  No  If you have had runny nose, nasal congestion, sneezing in the past 24 hours, has it worsened? No  COVID-19 EXPOSURE  Have you traveled outside the state in the past 14 days? No  Have you been in contact with someone with a confirmed diagnosis of COVID-19 or PUI in the past 14 days without wearing appropriate PPE? No  Have you been living in the same home as a person with confirmed diagnosis of COVID-19 or a PUI (household contact)? No  Have you been diagnosed with COVID-19? No

## 2019-12-08 NOTE — Progress Notes (Signed)
   12/08/19 1300  Psych Admission Type (Psych Patients Only)  Admission Status Voluntary  Psychosocial Assessment  Patient Complaints Anxiety  Eye Contact Fair  Facial Expression Flat  Affect Appropriate to circumstance;Preoccupied  Speech Logical/coherent;Soft  Interaction Minimal;Isolative  Motor Activity Slow  Appearance/Hygiene Unremarkable  Behavior Characteristics Cooperative  Mood Preoccupied  Aggressive Behavior  Targets  (reports feeling of wanting to puinch mom as reason for observation)  Thought Process  Coherency WDL  Content Preoccupation;Paranoia  Delusions Paranoid  Perception Hallucinations  Hallucination Auditory  Judgment Poor  Confusion Mild  Danger to Self  Current suicidal ideation? Denies  Danger to Others  Danger to Others None reported or observed  Danger to Others Abnormal  Harmful Behavior to others No threats or harm toward other people  Destructive Behavior No threats or harm toward property
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
## 2019-12-08 NOTE — Progress Notes (Signed)
Recreation Therapy Notes  Date: 5.3.21 Time: 1000 Location: 500 Hall Dayroom  Group Topic: Coping Skills  Goal Area(s) Addresses:  Patient will identify unhealthy and healthy coping strategies. Patient will identify benefit of using healthy coping strategies.  Intervention: Worksheet, pencils  Activity: Unhealthy vs. Healthy Coping Strategies.  Patients are to identify a problem they are currently dealing.  Patients then identify the unhealthy coping strategies they have used and the consequences of using them.  Patients then identify the healthy coping strategies, expected outcomes and barriers to using the healthy strategies.  Education: Radiographer, therapeutic, Dentist.   Education Outcome: Acknowledges understanding/In group clarification offered/Needs additional education.   Clinical Observations/Feedback: Pt did not attend group.    Victorino Sparrow, LRT/CTRS         Victorino Sparrow A 12/08/2019 12:35 PM

## 2019-12-08 NOTE — Progress Notes (Signed)
Patient had a high pulse when vitals were taken. She reported feelin light-headed and dizzy. She received gatorade and advised to rest. He pulse sitting was 144, bp-133/86 and standing pulse 172, bp 119/74. Patient reports she feels her medication may be a little too much but she doesn't hear voices anymore.

## 2019-12-08 NOTE — BHH Group Notes (Signed)
LCSW Group Therapy Notes 12/08/2019 2:55 PM  Type of Therapy and Topic: Group Therapy: Overcoming Obstacles  Participation Level: Active  Description of Group:  In this group patients will be encouraged to explore what they see as obstacles to their own wellness and recovery. They will be guided to discuss their thoughts, feelings, and behaviors related to these obstacles. The group will process together ways to cope with barriers, with attention given to specific choices patients can make. Each patient will be challenged to identify changes they are motivated to make in order to overcome their obstacles. This group will be process-oriented, with patients participating in exploration of their own experiences as well as giving and receiving support and challenge from other group members.  Therapeutic Goals: 1. Patient will identify personal and current obstacles as they relate to admission. 2. Patient will identify barriers that currently interfere with their wellness or overcoming obstacles.  3. Patient will identify feelings, thought process and behaviors related to these barriers. 4. Patient will identify two changes they are willing to make to overcome these obstacles:   Summary of Patient Progress Chaneta joined group half-way through, she listened attentively to her peers. Garnell shared that grieving the loss of two miscarriages is something she struggled with prior to admission. Talking about her experiences have helped and her mother is her biggest support. Odilia also shared that medications have helped her anxiety.  Therapeutic Modalities:  Cognitive Behavioral Therapy Solution Focused Therapy Motivational Interviewing Relapse Prevention Therapy  Stephanie Acre, MSW, [facility] 12/08/2019 2:55 PM

## 2019-12-09 ENCOUNTER — Ambulatory Visit (HOSPITAL_COMMUNITY)
Admission: AD | Admit: 2019-12-09 | Discharge: 2019-12-09 | Disposition: A | Discharge status: Home / Self Care | Payer: Medicaid Other | Attending: Psychiatry | Admitting: Psychiatry

## 2019-12-09 DIAGNOSIS — F329 Major depressive disorder, single episode, unspecified: Secondary | ICD-10-CM | POA: Diagnosis not present

## 2019-12-09 DIAGNOSIS — Z818 Family history of other mental and behavioral disorders: Secondary | ICD-10-CM | POA: Insufficient documentation

## 2019-12-09 DIAGNOSIS — R45850 Homicidal ideations: Secondary | ICD-10-CM | POA: Insufficient documentation

## 2019-12-09 DIAGNOSIS — F419 Anxiety disorder, unspecified: Secondary | ICD-10-CM | POA: Insufficient documentation

## 2019-12-09 MED ORDER — RISPERIDONE 3 MG PO TABS: 3.0000 mg | ORAL_TABLET | Freq: Every day | ORAL | 0 refills | Status: DC

## 2019-12-09 MED ORDER — FLUOXETINE HCL 20 MG PO CAPS: 20.0000 mg | ORAL_CAPSULE | Freq: Every day | ORAL | 0 refills | Status: DC

## 2019-12-09 MED ORDER — RISPERIDONE 3 MG PO TABS
3.0000 mg | ORAL_TABLET | Freq: Every day | ORAL | Status: DC
  Filled 2019-12-09: qty 7
  Filled 2019-12-09: qty 1
  Filled 2019-12-09: qty 7

## 2019-12-09 MED ORDER — PANTOPRAZOLE SODIUM 20 MG PO TBEC: 20.0000 mg | DELAYED_RELEASE_TABLET | Freq: Every day | ORAL | 0 refills | Status: DC

## 2019-12-09 MED ORDER — RISPERIDONE 1 MG PO TABS: 1.0000 mg | ORAL_TABLET | Freq: Every morning | ORAL | 0 refills | Status: DC

## 2019-12-09 MED ORDER — TRAZODONE HCL 100 MG PO TABS: 100.0000 mg | ORAL_TABLET | Freq: Every day | ORAL | 0 refills | Status: DC

## 2019-12-09 NOTE — BHH Suicide Risk Assessment (Signed)
[facility] Discharge Suicide Risk Assessment   Principal Problem: Psychoactive substance-induced psychosis ([place]) Discharge Diagnoses: Principal Problem:   Psychoactive substance-induced psychosis ([place]) Active Problems:   Psychosis ([place])   Total Time spent with patient: 20 minutes  Musculoskeletal: Strength & Muscle Tone: within normal limits Gait & Station: normal Patient leans: N/A  Psychiatric Specialty Exam: Review of Systems  All other systems reviewed and are negative.   Blood pressure 119/74, pulse (!) 172, temperature 97.8 F (36.6 C), temperature source Oral, resp. rate 18, height 5\' 5"  (1.651 m), weight 72.6 kg, SpO2 100 %.Body mass index is 26.63 kg/m.  General Appearance: Casual  Eye Contact::  Fair  Speech:  Normal Rate409  Volume:  Normal  Mood:  Anxious  Affect:  Congruent  Thought Process:  Coherent and Descriptions of Associations: Intact  Orientation:  Full (Time, Place, and Person)  Thought Content:  Hallucinations: Auditory  Suicidal Thoughts:  No  Homicidal Thoughts:  No  Memory:  Immediate;   Fair Recent;   Fair Remote;   Fair  Judgement:  Intact  Insight:  Fair  Psychomotor Activity:  Normal  Concentration:  Fair  Recall:  [employer] of Knowledge:Fair  Language: Good  Akathisia:  Negative  Handed:  Right  AIMS (if indicated):     Assets:  Desire for Improvement Resilience  Sleep:  Number of Hours: 6.5  Cognition: WNL  ADL's:  Intact   Mental Status Per Nursing Assessment::   On Admission:  NA  Demographic Factors:  Low socioeconomic status and Unemployed  Loss Factors: Financial problems/change in socioeconomic status  Historical Factors: Impulsivity  Risk Reduction Factors:   Living with another person, especially a relative and Positive social support  Continued Clinical Symptoms:  Depression:   Comorbid alcohol abuse/dependence Hopelessness Alcohol/Substance Abuse/Dependencies More than one psychiatric diagnosis  Cognitive  Features That Contribute To Risk:  None    Suicide Risk:  Minimal: No identifiable suicidal ideation.  Patients presenting with no risk factors but with morbid ruminations; may be classified as minimal risk based on the severity of the depressive symptoms  Follow-up Information    [facility] Go to.   Specialty: Professional Counselor Why: Please go to this provider during their walk in hours.  Walk in hours are 8:30 am to 12:00 pm and 1:00 pm to 2:30 pm, Monday through Friday.  Contact information: [place] [phone]        [facility] Follow up on 12/29/2019.   Why: You are scheduled for an appointment on 12/29/19 at 2:30 pm.  This appointment will be held virtually.  Please call this provider prior to your appointment date for more information. Contact information: [place] [phone]          Plan Of Care/Follow-up recommendations:  Activity:  ad lib  Sharma Covert, MD 12/09/2019, 9:08 AM

## 2019-12-09 NOTE — Progress Notes (Signed)
Patient's self inventory sheet, patient sleeps good, no sleep medication.  Good appetite, normal energy level, good concentration.  Rated depression and hopeless 1, anxiety 2.  Denied withdrawals.  Denied SI.  Denied physical problems.  Denied physical pain.  Goal is discharge.  Plans to attend group and come out of her room.

## 2019-12-09 NOTE — Progress Notes (Signed)
  [facility] Adult Case Management Discharge Plan :  Will you be returning to the same living situation after discharge:  Yes,  home. At discharge, do you have transportation home?: Yes,  Safe Transportation at 11:00am. Do you have the ability to pay for your medications: Yes,  has Medicaid.  Release of information consent forms completed and in the chart.  Patient to Follow up at: Follow-up Information    [place] Go to.   Specialty: Professional Counselor Why: Please go to this provider during their walk in hours.  Walk in hours are 8:30 am to 12:00 pm and 1:00 pm to 2:30 pm, Monday through Friday.  Contact information: [place] [phone] [phone]        [facility] Follow up on 12/29/2019.   Why: You are scheduled for an appointment on 12/29/19 at 2:30 pm.  This appointment will be held virtually.  Please call this provider prior to your appointment date for more information. Contact information: [place] [phone]          Next level of care provider has access to [place] and Suicide Prevention discussed: Yes,  with mother.  Has patient been referred to the Quitline?: N/A patient is not a smoker  Patient has been referred for addiction treatment: Yes  Joellen Jersey, [place] 12/09/2019, 9:16 AM

## 2019-12-09 NOTE — Progress Notes (Signed)
Recreation Therapy Notes  INPATIENT RECREATION TR PLAN  Patient Details Name: Sandra Macdonald MRN: [id number] DOB: 1995/12/19 Today's Date: 12/09/2019  Rec Therapy Plan Is patient appropriate for Therapeutic Recreation?: Yes Treatment times per week: about 3 days Estimated Length of Stay: 5-7 days TR Treatment/Interventions: Group participation (Comment)  Discharge Criteria Pt will be discharged from therapy if:: Discharged Treatment plan/goals/alternatives discussed and agreed upon by:: Patient/family  Discharge Summary Short term goals set: See patient care plan Short term goals met: Not met Progress toward goals comments: Groups attended Which groups?: Wellness, Other (Comment)(Team building) Reason goals not met: Pt attended two groups Therapeutic equipment acquired: N/A Reason patient discharged from therapy: Discharge from hospital Pt/family agrees with progress & goals achieved: Yes Date patient discharged from therapy: 12/09/19   Victorino Sparrow, LRT/CTRS  Ria Comment, [place] 12/09/2019, 11:04 AM

## 2019-12-09 NOTE — H&P (Signed)
Behavioral Health Medical Screening Exam  Sandra Macdonald is an 24 y.o. female who presents to [facility] as a walk-in for increased anxiety homicidal ideation towards her mother. Pt was just discharged this afternoon after a 7 day admission at [facility]. Pt reports she was feeling HI earlier today when she got home after discharge. Pt denies current SI, HI or AVH. She requested for a clarification about her medication use and administration times. This information was provided by this provided. Pt states she can contract for safety. Pt was advised to follow up with her outpatient resources tomorrow.   Total Time spent with patient: 20 minutes  Psychiatric Specialty Exam: Physical Exam  Constitutional: She is oriented to person, place, and time. She appears well-developed and well-nourished.  HENT:  Head: Normocephalic.  Eyes: Pupils are equal, round, and reactive to light.  Respiratory: Effort normal.  Musculoskeletal:        General: Normal range of motion.     Cervical back: Normal range of motion.  Neurological: She is alert and oriented to person, place, and time.  Skin: Skin is warm and dry.  Psychiatric: Her speech is normal and behavior is normal. Judgment and thought content normal. Her mood appears anxious. Cognition and memory are normal.    Review of Systems  Psychiatric/Behavioral: Negative for agitation, behavioral problems, confusion, decreased concentration, dysphoric mood, hallucinations, self-injury, sleep disturbance and suicidal ideas. The patient is nervous/anxious. The patient is not hyperactive.   All other systems reviewed and are negative.   Blood pressure 119/74, pulse (!) 172, temperature 97.8 F (36.6 C), temperature source Oral, resp. rate 18, height 5\' 5"  (1.651 m), weight 72.6 kg, SpO2 100 %.Body mass index is 26.63 kg/m.  General Appearance: Casual  Eye Contact:  Good  Speech:  Normal Rate  Volume:  Decreased  Mood:  Anxious  Affect:  Congruent  Thought Process:   Coherent and Descriptions of Associations: Intact  Orientation:  Full (Time, Place, and Person)  Thought Content:  WDL  Suicidal Thoughts:  No  Homicidal Thoughts:  No  Memory:  Recent;   Fair  Judgement:  Fair  Insight:  Fair  Psychomotor Activity:  Normal  Concentration: Concentration: Good  Recall:  [employer] of Knowledge:Good  Language: Good  Akathisia:  No  Handed:  Right  AIMS (if indicated):     Assets:  Communication Skills Desire for Improvement Financial Resources/Insurance Housing  Sleep:  Number of Hours: 6.5    Musculoskeletal: Strength & Muscle Tone: within normal limits Gait & Station: normal Patient leans: N/A  Blood pressure 119/74, pulse (!) 172, temperature 97.8 F (36.6 C), temperature source Oral, resp. rate 18, height 5\' 5"  (1.651 m), weight 72.6 kg, SpO2 100 %.  Recommendations:  Based on my evaluation the patient does not appear to have an emergency medical condition.   Disposition: No evidence of imminent risk to self or others at present.   Patient does not meet criteria for psychiatric inpatient admission. Supportive therapy provided about ongoing stressors. Discussed crisis plan, support from social network, calling 911, coming to the Emergency Department, and calling Suicide Hotline.  Mliss Fritz, NP 12/09/2019, 8:19 PM

## 2019-12-09 NOTE — Plan of Care (Signed)
Pt attended two recreation therapy group sessions.    Victorino Sparrow, LRT/CTRS

## 2019-12-09 NOTE — Progress Notes (Signed)
Recreation Therapy Notes  Date: 5.4.21  Time: 1000 Location: 500 Hall Dayroom  Group Topic: Wellness  Goal Area(s) Addresses:  Patient will define components of whole wellness. Patient will verbalize benefit of whole wellness.  Behavioral Response: Engaged  Intervention: Music   Activity: Exercise.  LRT led group in a series of stretches.  Patients each got the opportunity to lead the group in an exercise of their choice.  Patients were encouraged to pay attention to any pains or strains they have.  Patients were also encouraged to drink water and take breaks if needed.    Education: Wellness, Dentist.   Education Outcome: Acknowledges education/In group clarification offered/Needs additional education.   Clinical Observations/Feedback:  Pt actively participated in the exercises.  Pt was pleasant and smiling throughout group session.  Pt was social when engaged.  Pt was able to complete the exercises presented in group.    Victorino Sparrow, LRT/CTRS

## 2019-12-09 NOTE — BHH Counselor (Signed)
Patient verbalizes readiness for discharge. CSW provided patient with peer support and support group information, additional copy of printed materials placed on chart.  CSW will coordinate Safe Transportation for patient to return home.  Stephanie Acre, MSW, LCSW-A Clinical Social Worker [facility]

## 2019-12-09 NOTE — Progress Notes (Signed)
Discharge Note:  Patient discharged home via Safe Transportt.  Patient denied SI and HI.  Denied A/V hallucinations.  Suicide prevention information given and discussed with patient who stated she understood and had no questions.  Patient stated she received all her belongings, clothing, toiletries, etc.  Patient stated she appreciated all assistance received from [facility] staff.  All required discharge information given to patient at discharge.

## 2019-12-09 NOTE — Progress Notes (Signed)
D:  Patient denied SI and HI, contracts for safety.  Denied A/V hallucinations.  Denied pain. A:  Medications administered per MD orders.  Emotional support and encouragement given patient. R:  Safety maintained with 15 minute checks.  
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
## 2019-12-10 ENCOUNTER — Observation Stay (HOSPITAL_COMMUNITY)
Admission: AD | Admit: 2019-12-10 | Discharge: 2019-12-11 | Disposition: A | Discharge status: Home / Self Care | Payer: Medicaid Other | Attending: Psychiatry | Admitting: Psychiatry

## 2019-12-10 ENCOUNTER — Other Ambulatory Visit: Payer: Self-pay

## 2019-12-10 ENCOUNTER — Encounter (HOSPITAL_COMMUNITY): Payer: Self-pay | Admitting: Registered Nurse

## 2019-12-10 DIAGNOSIS — Z20822 Contact with and (suspected) exposure to covid-19: Secondary | ICD-10-CM | POA: Insufficient documentation

## 2019-12-10 DIAGNOSIS — F419 Anxiety disorder, unspecified: Secondary | ICD-10-CM | POA: Diagnosis not present

## 2019-12-10 DIAGNOSIS — F329 Major depressive disorder, single episode, unspecified: Secondary | ICD-10-CM | POA: Insufficient documentation

## 2019-12-10 DIAGNOSIS — F29 Unspecified psychosis not due to a substance or known physiological condition: Principal | ICD-10-CM | POA: Insufficient documentation

## 2019-12-10 DIAGNOSIS — Z79899 Other long term (current) drug therapy: Secondary | ICD-10-CM | POA: Diagnosis not present

## 2019-12-10 LAB — RESPIRATORY PANEL BY RT PCR (FLU A&B, COVID)
Influenza A by PCR: NEGATIVE
Influenza B by PCR: NEGATIVE
SARS Coronavirus 2 by RT PCR: NEGATIVE

## 2019-12-10 MED ORDER — RISPERIDONE 1 MG PO TABS
1.0000 mg | ORAL_TABLET | Freq: Every morning | ORAL | Status: DC
  Administered 2019-12-11: 07:00:00 1 mg via ORAL
  Filled 2019-12-10: qty 1

## 2019-12-10 MED ORDER — FLUOXETINE HCL 20 MG PO CAPS
20.0000 mg | ORAL_CAPSULE | Freq: Every day | ORAL | Status: DC
  Administered 2019-12-11: 20 mg via ORAL
  Filled 2019-12-10: qty 1

## 2019-12-10 MED ORDER — RISPERIDONE 3 MG PO TABS
3.0000 mg | ORAL_TABLET | Freq: Every day | ORAL | Status: DC
  Administered 2019-12-10: 21:00:00 3 mg via ORAL
  Filled 2019-12-10: qty 1

## 2019-12-10 MED ORDER — MAGNESIUM HYDROXIDE 400 MG/5ML PO SUSP: 30.0000 mL | Freq: Every day | ORAL | Status: DC | PRN

## 2019-12-10 MED ORDER — ACETAMINOPHEN 325 MG PO TABS: 650.0000 mg | ORAL_TABLET | Freq: Four times a day (QID) | ORAL | Status: DC | PRN

## 2019-12-10 MED ORDER — PANTOPRAZOLE SODIUM 20 MG PO TBEC
20.0000 mg | DELAYED_RELEASE_TABLET | Freq: Every day | ORAL | Status: DC
  Administered 2019-12-11: 08:00:00 20 mg via ORAL
  Filled 2019-12-10: qty 1

## 2019-12-10 MED ORDER — ALUM & MAG HYDROXIDE-SIMETH 200-200-20 MG/5ML PO SUSP: 30.0000 mL | ORAL | Status: DC | PRN

## 2019-12-10 MED ORDER — TRAZODONE HCL 100 MG PO TABS
100.0000 mg | ORAL_TABLET | Freq: Every day | ORAL | Status: DC
  Administered 2019-12-10: 21:00:00 100 mg via ORAL
  Filled 2019-12-10: qty 1

## 2019-12-10 MED ORDER — HYDROXYZINE HCL 25 MG PO TABS
25.0000 mg | ORAL_TABLET | Freq: Three times a day (TID) | ORAL | Status: DC | PRN
  Administered 2019-12-10 – 2019-12-11 (×2): 25 mg via ORAL
  Filled 2019-12-10 (×2): qty 1

## 2019-12-10 NOTE — Progress Notes (Signed)
Pt now stating SI is improved, continues to contract for safety. Continues to deny HI & AVH. Quiet & staying in room with close observation continuing. Asking for water which is given with direct supervision only.

## 2019-12-10 NOTE — H&P (Signed)
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  Patient states that she is having hallucinations with voices telling her to kill someone but nobody in particular and no plan.  Patient was recently discharged from [facility] (4/24-5/24/2021).  Patient also presented as a walk-in last night on the same day as discharge.  Patient states that she did follow-up with her outpatient psychiatric services.  States she went to [facility] today at 3 PM and was given forms to fill out so that an appointment could be set up for her.  Patient was instructed that she needed to go early in the morning because stop seeing patients around 3:30 or 4 pm.  Spoke with Dr. Meredith Pel explained that this was patient's second time coming back since discharge recommend and observe overnight with possible discharge in the morning for patient to follow-up with outpatient services. Associated Signs/Symptoms: Depression Symptoms:  depressed mood, difficulty concentrating, hopelessness, anxiety, (Hypo) Manic Symptoms:  Hallucinations, Impulsivity, Anxiety Symptoms:  Excessive Worry, Psychotic Symptoms:  Hallucinations: Auditory PTSD Symptoms: NA Total Time spent with patient: 30 minutes  Past Psychiatric History: Auditory hallucinations  Is the patient at risk to self? No.  Has the patient been a risk to self in the past 6 months? No.  Has the patient been a risk to self within the distant past? Yes.    Is the patient a risk to others? No.  Has the patient been a risk to others in the past 6 months? No.  Has the patient been a risk to others within the distant past? No.   Prior  Inpatient Therapy:   Recently discharged from [facility] on 12/09/2019 Prior Outpatient Therapy:   In the process of setting up services with [facility]   Alcohol Screening:   Substance Abuse History in the last 12 months:  Yes.   Consequences of Substance Abuse: Psychotic symptoms Previous Psychotropic Medications: Yes  Psychological Evaluations: Yes  Past Medical History:  Past Medical History:  Diagnosis Date  . Depression   . Fibroid   . G6PD deficiency   . Supervision of normal first pregnancy, antepartum 02/18/2018    Nursing Staff Provider Office Location  [facility] Dating  LMP/6 week [place]  Language   English  Anatomy US   Flu Vaccine  n/a Genetic Screen  NIPS: low risk, female  AFP:   TDaP vaccine    Hgb A1C or  GTT Early  Third trimester  Rhogam     LAB RESULTS  Feeding Plan Breast Blood Type A/Positive/-- (07/15 0942)  Contraception  Antibody Negative (07/15 0942) Circumcision  Rubella 3.00 (07/15 [id number]) Pediatricia    Past Surgical History:  Procedure Laterality Date  . NO PAST SURGERIES     Family History:  Family History  Problem Relation Age of Onset  . Mental illness Mother   . ADD / ADHD Neg Hx   . Anxiety disorder Neg Hx   . Alcohol abuse Neg Hx   . Arthritis Neg Hx   . Asthma Neg Hx   . Cancer Neg Hx   . Birth defects Neg Hx   . Depression Neg Hx   . COPD Neg Hx   . Diabetes  Neg Hx   . Drug abuse Neg Hx   . Early death Neg Hx   . Hearing loss Neg Hx   . Heart disease Neg Hx   . Hyperlipidemia Neg Hx   . Hypertension Neg Hx   . Intellectual disability Neg Hx   . Kidney disease Neg Hx   . Learning disabilities Neg Hx   . Miscarriages / Stillbirths Neg Hx   . Stroke Neg Hx   . Obesity Neg Hx   . Vision loss Neg Hx   . Varicose Veins Neg Hx    Family Psychiatric History: Unaware Tobacco Screening:   Social History:  Social History   Substance and Sexual Activity  Alcohol Use Yes  . Alcohol/week: 1.0 standard drinks  . Types: 1 Standard  drinks or equivalent per week   Comment: per pt drinks socially 1-2x month on average     Social History   Substance and Sexual Activity  Drug Use Yes  . Frequency: 14.0 times per week  . Types: Marijuana   Comment: pt reports 1-2 blunts on work days, more on days off    Additional Social History:                           Allergies:   Allergies  Allergen Reactions  . Blue Dyes (Parenteral) Other (See Comments)    G6PD-- Decreased hemoglobin  . Codeine Other (See Comments)    Decreased hemoglobin  . Other Other (See Comments)    G6PD--Decreased hemoglobin to potato chips (not potatoes in other forms)   Lab Results: No results found for this or any previous visit (from the past 48 hour(s)).  Blood Alcohol level:  Lab Results  Component Value Date   ETH <10 [id number]    Metabolic Disorder Labs:  Lab Results  Component Value Date   HGBA1C <4.2 (L) 09/16/2018   No results found for: PROLACTIN No results found for: CHOL, TRIG, HDL, CHOLHDL, VLDL, LDLCALC  Current Medications: Current Facility-Administered Medications  Medication Dose Route Frequency Provider Last Rate Last Admin  . acetaminophen (TYLENOL) tablet 650 mg  650 mg Oral Q6H PRN Gay Rape B, NP      . alum & mag hydroxide-simeth (MAALOX/MYLANTA) 200-200-20 MG/5ML suspension 30 mL  30 mL Oral Q4H PRN Yailyn Strack B, NP      . FLUoxetine (PROZAC) capsule 20 mg  20 mg Oral Daily Dennice Tindol B, NP      . magnesium hydroxide (MILK OF MAGNESIA) suspension 30 mL  30 mL Oral Daily PRN Ernestene Coover B, NP      . pantoprazole (PROTONIX) EC tablet 20 mg  20 mg Oral Daily Faithlyn Recktenwald B, NP      . [START ON 12/11/2019] risperiDONE (RISPERDAL) tablet 1 mg  1 mg Oral q AM Montavis Schubring B, NP      . risperiDONE (RISPERDAL) tablet 3 mg  3 mg Oral QHS Treyvon Blahut B, NP      . traZODone (DESYREL) tablet 100 mg  100 mg Oral QHS Tyquarius Paglia B, NP       PTA Medications: Medications Prior to  Admission  Medication Sig Dispense Refill Last Dose  . FLUoxetine (PROZAC) 20 MG capsule Take 1 capsule (20 mg total) by mouth daily. 30 capsule 0   . pantoprazole (PROTONIX) 20 MG tablet Take 1 tablet (20 mg total) by mouth daily. 30 tablet 0   . risperiDONE (RISPERDAL) 1 MG tablet  Take 1 tablet (1 mg total) by mouth in the morning. 30 tablet 0   . risperiDONE (RISPERDAL) 3 MG tablet Take 1 tablet (3 mg total) by mouth at bedtime. 30 tablet 0   . traZODone (DESYREL) 100 MG tablet Take 1 tablet (100 mg total) by mouth at bedtime. 30 tablet 0     Musculoskeletal: Strength & Muscle Tone: within normal limits Gait & Station: normal Patient leans: N/A  Psychiatric Specialty Exam: Physical Exam  Vitals reviewed. Constitutional: She is oriented to person, place, and time. She appears well-developed and well-nourished.  Musculoskeletal:     Cervical back: Normal range of motion.  Neurological: She is alert and oriented to person, place, and time.  Skin: Skin is warm and dry.  Psychiatric: Her mood appears anxious. Her speech is tangential. She is actively hallucinating. Cognition and memory are normal. She expresses impulsivity. She exhibits a depressed mood. She expresses homicidal ideation. She expresses no homicidal plans.    Review of Systems  Psychiatric/Behavioral: Positive for hallucinations. Negative for self-injury, sleep disturbance and suicidal ideas. The patient is nervous/anxious.        Patient reports she is having thoughts of killing someone no one in particular.   All other systems reviewed and are negative.   Blood pressure 137/89, pulse (!) 162, temperature 99.5 F (37.5 C), temperature source Oral, resp. rate 16, SpO2 99 %.There is no height or weight on file to calculate BMI.  General Appearance: Casual  Eye Contact:  Good  Speech:  Clear and Coherent and Normal Rate  Volume:  Decreased  Mood:  Anxious and Depressed  Affect:  Congruent and Depressed  Thought  Process:  Coherent, Goal Directed and Descriptions of Associations: Intact  Orientation:  Full (Time, Place, and Person)  Thought Content:  Hallucinations: Auditory  Suicidal Thoughts:  No  Homicidal Thoughts:  Yes.  without intent/plan  Memory:  Immediate;   Good Recent;   Good  Judgement:  Fair  Insight:  Fair  Psychomotor Activity:  Normal  Concentration:  Concentration: Good and Attention Span: Good  Recall:  Good  Fund of Knowledge:  Fair  Language:  Good  Akathisia:  No  Handed:  Right  AIMS (if indicated):     Assets:  Communication Skills Housing Social Support  ADL's:  Intact  Cognition:  WNL  Sleep:         Treatment Plan Summary: Plan Overnight observation.  Reassess in the morning possible discharge  Observation Level/Precautions:  15 minute checks Laboratory:  UDS UA Psychotherapy: Individual Medications: Home medications restarted.  Med's the patient was taken at discharge from [facility] Consultations: As needed Discharge Concerns:   Safety Estimated LOS: Overnight observation Other:      Diyari Cherne, NP 5/5/20216:09 PM

## 2019-12-10 NOTE — Progress Notes (Signed)
Patient ID: Sandra Macdonald, female   DOB: 05/09/1996, 24 y.o.   MRN: [id number]   Patient alert and oriented on the unit and anxious. Pt endorsed HI and audio hallucinations telling her to kill someone, but was not specific about whom. COVID test complete. Education, support, reassurance, and encouragement provided, q15 minute safety checks initiated. Pt verbally contracts for safety. Pt ambulating on the unit with no issues. Pt remains safe on the unit.

## 2019-12-10 NOTE — Progress Notes (Addendum)
Pt A&O, endorsing active thoughts of SI with plan to get water and put it in the outlet in her room to electrocute herself. Minimally, with encouragement, contracting for safety. Denies HI at this time. Pt removed from room to day room for close obs for safety.

## 2019-12-10 NOTE — BH Assessment (Addendum)
Patient presents as a walk-in again today, after being assessed last night.  She was just discharged earlier yesterday afternoon from a 7 day admission to [facility].  Patient was able to affirm her safety and agreed with follow up recommendations at discharge.   Medications were reviewed and she agreed to follow up with [facility] today as recommended.  Patient states she presented to [facility] today around 3pm.  She states she left the clinic with a packet to fill out.  She then requested that [employer] bring her back to [facility].  She states "nothing" has changed since she was assessed here at  [facility] last night.   After a pause, she then continued to ask to stay for fear she "might hurt someone."  She endorses HI, however this is vague with no identified victim or plan.  When she learned she may be discharged, she kicked her food tray across the room. Staff addressed this inappropriate behavior.  Patient has agreed to behave appropriately and remain safe while in observation.    Per Faythe Ghee, NP Dr. Mallie Darting has recommended that patient be admitted to overnight observation for safety, to resume recommended medications and discharge early to [facility] in the morning.

## 2019-12-10 NOTE — Progress Notes (Signed)
Patient ID: Sandra Macdonald, female   DOB: 1996/07/08, 24 y.o.   MRN: [id number]                             [facility] Observation Crisis Plan  Reason for Crisis Plan:  Chronic Mental Illness/Medical Illness, Crisis Stabilization and Medication Management   Plan of Care:  Referral for Telepsychiatry/Psychiatric Consult  Family Support:      Current Living Environment:     Insurance:   Hospital Account    Name Acct ID Class Status Primary Coverage   Meriwether, Shnayder [id number] [place] Account (for Hospital Account [id number])    Name Relation to Pt Service Area Active? Acct Type   [name] Self [facility] Yes [facility]   Address Phone       [place], [place] [address] [phone])          Coverage Information (for Hospital Account [id number])    F/O Payor/Plan Precert #   [facility] MEDICAID/[place] MEDICA[place]    Subscriber Subscriber #   Kindel, [place] [id number]   Address Phone   [address] [place], [place] [address] [phone]      Legal Guardian:     Primary Care Provider:  Patient, No Pcp Per  Current Outpatient Providers:  [facility]  Psychiatrist:   unknown  Counselor/Therapist:   unknown  Compliant with Medications:  No  Additional Information:   Harriet Masson 5/5/20216:53 PM

## 2019-12-11 DIAGNOSIS — F250 Schizoaffective disorder, bipolar type: Secondary | ICD-10-CM | POA: Diagnosis not present

## 2019-12-11 DIAGNOSIS — F29 Unspecified psychosis not due to a substance or known physiological condition: Secondary | ICD-10-CM | POA: Diagnosis not present

## 2019-12-11 LAB — RAPID URINE DRUG SCREEN, HOSP PERFORMED
Amphetamines: NOT DETECTED
Barbiturates: NOT DETECTED
Benzodiazepines: POSITIVE — AB
Cocaine: NOT DETECTED
Opiates: NOT DETECTED
Tetrahydrocannabinol: POSITIVE — AB

## 2019-12-11 LAB — URINALYSIS, ROUTINE W REFLEX MICROSCOPIC
Bilirubin Urine: NEGATIVE
Glucose, UA: NEGATIVE mg/dL
Hgb urine dipstick: NEGATIVE
Ketones, ur: 80 mg/dL — AB
Leukocytes,Ua: NEGATIVE
Nitrite: NEGATIVE
Protein, ur: 30 mg/dL — AB
Specific Gravity, Urine: 1.027 (ref 1.005–1.030)
pH: 5 (ref 5.0–8.0)

## 2019-12-11 LAB — PREGNANCY, URINE: Preg Test, Ur: NEGATIVE

## 2019-12-11 MED ORDER — HYDROXYZINE HCL 25 MG PO TABS
25.0000 mg | ORAL_TABLET | Freq: Once | ORAL | Status: AC
  Administered 2019-12-11: 01:00:00 25 mg via ORAL
  Filled 2019-12-11: qty 1

## 2019-12-11 MED ORDER — TRAZODONE HCL 50 MG PO TABS
50.0000 mg | ORAL_TABLET | Freq: Once | ORAL | Status: AC
  Administered 2019-12-11: 01:00:00 50 mg via ORAL
  Filled 2019-12-11: qty 1

## 2019-12-11 MED ORDER — RISPERIDONE 2 MG PO TABS: 2.0000 mg | ORAL_TABLET | Freq: Every morning | ORAL | Status: DC

## 2019-12-11 MED ORDER — RISPERIDONE 2 MG PO TABS: 4.0000 mg | ORAL_TABLET | Freq: Every day | ORAL | Status: DC

## 2019-12-11 NOTE — Discharge Summary (Signed)
[facility] Psych Observation Discharge  12/11/2019 1:15 PM Sandra Macdonald  MRN:  [id number] Principal Problem: <principal problem not specified> Discharge Diagnoses: Active Problems:   Psychotic disorder ([place])   Subjective: Patient seen by Dr. Mallie Darting feeling better this morning and will follow up with [facility]. Patient denied suicidal/self-harm/homicidal ideation.  Continues to have auditory hallucinations; medication changes made.   Total Time spent with patient: 15 minutes  Past Psychiatric History:  Auditory hallucinations  Past Medical History:  Past Medical History:  Diagnosis Date  . Depression   . Fibroid   . G6PD deficiency   . Supervision of normal first pregnancy, antepartum 02/18/2018    Nursing Staff Provider Office Location  [facility] Dating  LMP/6 week [place]  Language   English  Anatomy US   Flu Vaccine  n/a Genetic Screen  NIPS: low risk, female  AFP:   TDaP vaccine    Hgb A1C or  GTT Early  Third trimester  Rhogam     LAB RESULTS  Feeding Plan Breast Blood Type A/Positive/-- (07/15 0942)  Contraception  Antibody Negative (07/15 0942) Circumcision  Rubella 3.00 (07/15 [id number]) Pediatricia    Past Surgical History:  Procedure Laterality Date  . NO PAST SURGERIES     Family History:  Family History  Problem Relation Age of Onset  . Mental illness Mother   . ADD / ADHD Neg Hx   . Anxiety disorder Neg Hx   . Alcohol abuse Neg Hx   . Arthritis Neg Hx   . Asthma Neg Hx   . Cancer Neg Hx   . Birth defects Neg Hx   . Depression Neg Hx   . COPD Neg Hx   . Diabetes Neg Hx   . Drug abuse Neg Hx   . Early death Neg Hx   . Hearing loss Neg Hx   . Heart disease Neg Hx   . Hyperlipidemia Neg Hx   . Hypertension Neg Hx   . Intellectual disability Neg Hx   . Kidney disease Neg Hx   . Learning disabilities Neg Hx   . Miscarriages / Stillbirths Neg Hx   . Stroke Neg Hx   . Obesity Neg Hx   . Vision loss Neg Hx   . Varicose Veins Neg Hx    Family Psychiatric  History:   See above Social History:  Social History   Substance and Sexual Activity  Alcohol Use Yes  . Alcohol/week: 1.0 standard drinks  . Types: 1 Standard drinks or equivalent per week   Comment: per pt drinks socially 1-2x month on average     Social History   Substance and Sexual Activity  Drug Use Yes  . Frequency: 14.0 times per week  . Types: Marijuana   Comment: pt reports 1-2 blunts on work days, more on days off    Social History   Socioeconomic History  . Marital status: Single    Spouse name: Not on file  . Number of children: Not on file  . Years of education: Not on file  . Highest education level: 10th grade  Occupational History  . Not on file  Tobacco Use  . Smoking status: Never Smoker  . Smokeless tobacco: Never Used  Substance and Sexual Activity  . Alcohol use: Yes    Alcohol/week: 1.0 standard drinks    Types: 1 Standard drinks or equivalent per week    Comment: per pt drinks socially 1-2x month on average  . Drug use: Yes  Frequency: 14.0 times per week    Types: Marijuana    Comment: pt reports 1-2 blunts on work days, more on days off  . Sexual activity: Not Currently    Birth control/protection: None  Other Topics Concern  . Not on file  Social History Narrative  . Not on file   Social Determinants of Health   Financial Resource Strain:   . Difficulty of Paying Living Expenses:   Food Insecurity:   . Worried About Charity fundraiser in the Last Year:   . Arboriculturist in the Last Year:   Transportation Needs:   . Film/video editor (Medical):   Marland Kitchen Lack of Transportation (Non-Medical):   Physical Activity:   . Days of Exercise per Week:   . Minutes of Exercise per Session:   Stress:   . Feeling of Stress :   Social Connections:   . Frequency of Communication with Friends and Family:   . Frequency of Social Gatherings with Friends and Family:   . Attends Religious Services:   . Active Member of Clubs or Organizations:   .  Attends Archivist Meetings:   Marland Kitchen Marital Status:     Has this patient used any form of tobacco in the last 30 days? (Cigarettes, Smokeless Tobacco, Cigars, and/or Pipes) Prescription not provided because: does not use tobacco products  Current Medications: Current Facility-Administered Medications  Medication Dose Route Frequency Provider Last Rate Last Admin  . acetaminophen (TYLENOL) tablet 650 mg  650 mg Oral Q6H PRN Bhavana Kady B, NP      . alum & mag hydroxide-simeth (MAALOX/MYLANTA) 200-200-20 MG/5ML suspension 30 mL  30 mL Oral Q4H PRN Treniya Lobb B, NP      . FLUoxetine (PROZAC) capsule 20 mg  20 mg Oral Daily Athalia Setterlund B, NP   20 mg at 12/11/19 0758  . hydrOXYzine (ATARAX/VISTARIL) tablet 25 mg  25 mg Oral TID PRN Anike, Adaku C, NP   25 mg at 12/11/19 0758  . magnesium hydroxide (MILK OF MAGNESIA) suspension 30 mL  30 mL Oral Daily PRN Paulyne Mooty B, NP      . pantoprazole (PROTONIX) EC tablet 20 mg  20 mg Oral Daily Doralene Glanz B, NP   20 mg at 12/11/19 0758  . [START ON 12/12/2019] risperiDONE (RISPERDAL) tablet 2 mg  2 mg Oral q AM Sharma Covert, MD      . risperiDONE (RISPERDAL) tablet 4 mg  4 mg Oral QHS Sharma Covert, MD      . traZODone (DESYREL) tablet 100 mg  100 mg Oral QHS Zubin Pontillo B, NP   100 mg at 12/10/19 2105   Current Outpatient Medications  Medication Sig Dispense Refill  . FLUoxetine (PROZAC) 20 MG capsule Take 1 capsule (20 mg total) by mouth daily. 30 capsule 0  . pantoprazole (PROTONIX) 20 MG tablet Take 1 tablet (20 mg total) by mouth daily. 30 tablet 0  . risperiDONE (RISPERDAL) 1 MG tablet Take 1 tablet (1 mg total) by mouth in the morning. 30 tablet 0  . risperiDONE (RISPERDAL) 3 MG tablet Take 1 tablet (3 mg total) by mouth at bedtime. 30 tablet 0  . traZODone (DESYREL) 100 MG tablet Take 1 tablet (100 mg total) by mouth at bedtime. 30 tablet 0   PTA Medications: No medications prior to admission.    Musculoskeletal: Strength & Muscle Tone: within normal limits Gait & Station: normal Patient leans: N/A   Psychiatric  Specialty Exam: Review of Systems  All other systems reviewed and are negative.   Blood pressure 137/89, pulse (!) 162, temperature 99.5 F (37.5 C), temperature source Oral, resp. rate 16, SpO2 99 %.There is no height or weight on file to calculate BMI.  General Appearance: Disheveled  Eye Sport and exercise psychologist::  Fair  Speech:  Normal Rate  Volume:  Normal  Mood:  Anxious  Affect:  Congruent  Thought Process:  Coherent and Descriptions of Associations: Intact  Orientation:  Full (Time, Place, and Person)  Thought Content:  Hallucinations: Auditory  Suicidal Thoughts:  No  Homicidal Thoughts:  No  Memory:  Immediate;   Fair Recent;   Fair Remote;   Fair  Judgement:  Intact  Insight:  Lacking  Psychomotor Activity:  Normal  Concentration:  Fair  Recall:  [employer] of Knowledge:Fair  Language: Good  Akathisia:  Negative  Handed:  Right  AIMS (if indicated):     Assets:  Desire for Improvement Housing Resilience  Sleep:     Cognition: WNL  ADL's:  Intact        Discharge Instructions     For your behavioral health needs you are advised to follow up with [facility].  New patients are seen at their walk-in clinic.  Walk-in hours are Monday - Friday from 8:30 am - 12:00 pm, and from 1:00 pm - 2:30 pm.  Walk-in patients are seen on a first come, first served basis, so try to arrive as early as possible for the best chance of being seen the same day:       [facility] [place],  [id number]      [phone]     Lilya Smitherman, NP 12/11/2019, 1:15 PM

## 2019-12-11 NOTE — Discharge Instructions (Signed)
For your behavioral health needs you are advised to follow up with [facility].  New patients are seen at their walk-in clinic.  Walk-in hours are Monday - Friday from 8:30 am - 12:00 pm, and from 1:00 pm - 2:30 pm.  Walk-in patients are seen on a first come, first served basis, so try to arrive as early as possible for the best chance of being seen the same day:       [facility]      [address]      [place], [place] [address]      [phone] 

## 2019-12-11 NOTE — BH Assessment (Signed)
[facility] Assessment Progress Note  Per Myles Lipps, MD, this pt does not require psychiatric hospitalization at this time.  Pt is to be discharged from the [facility] Observation Unit with recommendation to follow up with [facility].  This has been included in pt's discharge instructions.  Pt's nurse, Elberta Fortis, has been notified.  Jalene Mullet, [place] Triage Specialist [phone]

## 2019-12-11 NOTE — Progress Notes (Signed)
Patient ID: Sandra Macdonald, female   DOB: 02-27-1996, 24 y.o.   MRN: [id number]   D: Pt alert and oriented on the unit.   A: Education, support, and encouragement provided. Pt refused review of AVS/medications/follow up appointments. Pt provided with suicide prevention resources. Belongings in locker returned and belongings sheet signed.  R: Pt denies SI/HI, A/VH, pain, or any concerns at this time. Pt ambulatory on the unit. Pt discharged to lobby.

## 2019-12-11 NOTE — Progress Notes (Signed)
Pt up every few minutes until ~ 0200 asking for additional medications - given per [facility] - and asking to go home. Attempting to get out of locked doors.   Denied SI/HI in middle of [facility] but again endorsing both this am, stating she does not feel safe to leave.States she wanted to leave last noc d/t female patients being present on unit. Denies intent to harm or specific plan  & does contract for safety at present time. Needs met as able. q15 min safety monitoring continues.

## 2019-12-11 NOTE — BHH Suicide Risk Assessment (Signed)
[facility] Discharge Suicide Risk Assessment   Principal Problem: <principal problem not specified> Discharge Diagnoses: Active Problems:   Psychotic disorder ([place])   Total Time spent with patient: 20 minutes  Musculoskeletal: Strength & Muscle Tone: within normal limits Gait & Station: normal Patient leans: N/A  Psychiatric Specialty Exam: Review of Systems  All other systems reviewed and are negative.   Blood pressure 137/89, pulse (!) 162, temperature 99.5 F (37.5 C), temperature source Oral, resp. rate 16, SpO2 99 %.There is no height or weight on file to calculate BMI.  General Appearance: Disheveled  Eye Sport and exercise psychologist::  Fair  Speech:  Normal Rate409  Volume:  Normal  Mood:  Anxious  Affect:  Congruent  Thought Process:  Coherent and Descriptions of Associations: Intact  Orientation:  Full (Time, Place, and Person)  Thought Content:  Hallucinations: Auditory  Suicidal Thoughts:  No  Homicidal Thoughts:  No  Memory:  Immediate;   Fair Recent;   Fair Remote;   Fair  Judgement:  Intact  Insight:  Lacking  Psychomotor Activity:  Normal  Concentration:  Fair  Recall:  [employer] of Knowledge:Fair  Language: Good  Akathisia:  Negative  Handed:  Right  AIMS (if indicated):     Assets:  Desire for Improvement Housing Resilience  Sleep:     Cognition: WNL  ADL's:  Intact   Mental Status Per Nursing Assessment::   On Admission:  Thoughts of violence towards others  Demographic Factors:  Low socioeconomic status and Unemployed  Loss Factors: NA  Historical Factors: Impulsivity  Risk Reduction Factors:   Living with another person, especially a relative and Positive social support  Continued Clinical Symptoms:  Schizophrenia:   Less than 101 years old Paranoid or undifferentiated type  Cognitive Features That Contribute To Risk:  None    Suicide Risk:  Minimal: No identifiable suicidal ideation.  Patients presenting with no risk factors but with morbid  ruminations; may be classified as minimal risk based on the severity of the depressive symptoms    Plan Of Care/Follow-up recommendations:  Activity:  ad lib  Sharma Covert, MD 12/11/2019, 8:20 AM

## 2019-12-24 ENCOUNTER — Encounter (HOSPITAL_COMMUNITY): Payer: Self-pay | Admitting: Psychiatry

## 2019-12-24 ENCOUNTER — Encounter (HOSPITAL_COMMUNITY): Payer: Self-pay | Admitting: Emergency Medicine

## 2019-12-24 ENCOUNTER — Other Ambulatory Visit: Payer: Self-pay

## 2019-12-24 ENCOUNTER — Emergency Department (HOSPITAL_COMMUNITY)
Admission: EM | Admit: 2019-12-24 | Discharge: 2019-12-24 | Disposition: A | Discharge status: Home / Self Care | Payer: Medicaid Other | Attending: Emergency Medicine | Admitting: Emergency Medicine

## 2019-12-24 ENCOUNTER — Ambulatory Visit (HOSPITAL_COMMUNITY)
Admission: AD | Admit: 2019-12-24 | Discharge: 2019-12-24 | Disposition: A | Discharge status: Home / Self Care | Payer: Medicaid Other | Attending: Psychiatry | Admitting: Psychiatry

## 2019-12-24 DIAGNOSIS — K117 Disturbances of salivary secretion: Secondary | ICD-10-CM | POA: Insufficient documentation

## 2019-12-24 DIAGNOSIS — Z5321 Procedure and treatment not carried out due to patient leaving prior to being seen by health care provider: Secondary | ICD-10-CM | POA: Insufficient documentation

## 2019-12-24 DIAGNOSIS — F29 Unspecified psychosis not due to a substance or known physiological condition: Secondary | ICD-10-CM | POA: Diagnosis present

## 2019-12-24 MED ORDER — BENZTROPINE MESYLATE 1 MG PO TABS: 1.0000 mg | ORAL_TABLET | Freq: Two times a day (BID) | ORAL | 0 refills | Status: DC

## 2019-12-24 MED ORDER — BENZTROPINE MESYLATE 1 MG PO TABS
1.0000 mg | ORAL_TABLET | Freq: Two times a day (BID) | ORAL | Status: DC
  Filled 2019-12-24 (×3): qty 1

## 2019-12-24 MED ORDER — BENZTROPINE MESYLATE 1 MG PO TABS
1.0000 mg | ORAL_TABLET | Freq: Once | ORAL | Status: AC
  Administered 2019-12-24: 1 mg via ORAL
  Filled 2019-12-24: qty 1

## 2019-12-24 NOTE — BH Assessment (Signed)
Assessment Note  Sandra Macdonald is a 24 y.o. female who presented to [facility] as a voluntary walk-in with complaint of medication side effects and with a request for change to medication.  Pt lives in [place] with her mother, and she stated that she is on disability (also noted history of employment).   Pt begins outpatient psychiatric care at [facility] in June 2021.  Pt was recently discharged from [facility] for treatment of mood disturbance with prescriptions of Lexapro, Prozac, and Risperdal.  Pt presented to [facility] today reporting side effects of medication that she wanted addressed.  Specifically, Pt reported that issues with slurred speech and drooling.  Pt denied current suicidal ideation, hallucination, homicidal ideation, self-injurious behavior, and substance use concerns. Consulted with Sandra Johns, NP, who was present during assessment.  Ms. Jenne Campus made change to prescription.  Pt was discharged with prescription and note for work.  During assessment, Pt presented as alert and oriented.  She had good eye contact and was cooperative.  Pt was dressed in street clothes, and she appeared appropriately groomed.  Pt's mood and affect were somewhat anxious.  Pt's speech had apparent slur and/or impediment.  Pt's thought processes were within normal range, and thought content was logical and goal-oriented.  There was no evidence of delusion.  Pt's memory and concentration were intact.  Insight, judgment, and impulse control were fair.  Pt is psych-cleared.  Diagnosis: Major Depressive Disorder, Unspecified  Past Medical History:  Past Medical History:  Diagnosis Date  . Depression   . Fibroid   . G6PD deficiency   . Supervision of normal first pregnancy, antepartum 02/18/2018    Nursing Staff Provider Office Location  [facility] Dating  LMP/6 week [place]  Language   English  Anatomy US   Flu Vaccine  n/a Genetic Screen  NIPS: low risk, female  AFP:   TDaP vaccine    Hgb A1C or  GTT Early  Third trimester   Rhogam     LAB RESULTS  Feeding Plan Breast Blood Type A/Positive/-- (07/15 0942)  Contraception  Antibody Negative (07/15 0942) Circumcision  Rubella 3.00 (07/15 [id number]) Pediatricia    Past Surgical History:  Procedure Laterality Date  . NO PAST SURGERIES      Family History:  Family History  Problem Relation Age of Onset  . Mental illness Mother   . ADD / ADHD Neg Hx   . Anxiety disorder Neg Hx   . Alcohol abuse Neg Hx   . Arthritis Neg Hx   . Asthma Neg Hx   . Cancer Neg Hx   . Birth defects Neg Hx   . Depression Neg Hx   . COPD Neg Hx   . Diabetes Neg Hx   . Drug abuse Neg Hx   . Early death Neg Hx   . Hearing loss Neg Hx   . Heart disease Neg Hx   . Hyperlipidemia Neg Hx   . Hypertension Neg Hx   . Intellectual disability Neg Hx   . Kidney disease Neg Hx   . Learning disabilities Neg Hx   . Miscarriages / Stillbirths Neg Hx   . Stroke Neg Hx   . Obesity Neg Hx   . Vision loss Neg Hx   . Varicose Veins Neg Hx     Social History:  reports that she has never smoked. She has never used smokeless tobacco. She reports current alcohol use of about 1.0 standard drinks of alcohol per week. She reports current  drug use. Frequency: 14.00 times per week. Drug: Marijuana.  Additional Social History:  Alcohol / Drug Use Pain Medications: See MAR Prescriptions: See MAR Over the Counter: Seee MAR History of alcohol / drug use?: Yes Substance #1 Name of Substance 1: Marijuana  CIWA: CIWA-Ar BP: (!) 143/89 Pulse Rate: 76 COWS:    Allergies:  Allergies  Allergen Reactions  . Blue Dyes (Parenteral) Other (See Comments)    G6PD-- Decreased hemoglobin  . Codeine Other (See Comments)    Decreased hemoglobin  . Other Other (See Comments)    G6PD--Decreased hemoglobin to potato chips (not potatoes in other forms)    Home Medications: (Not in a hospital admission)   OB/GYN Status:  No LMP recorded.  General Assessment Data Location of Assessment: [facility] Assessment  Services TTS Assessment: In system Is this a Tele or Face-to-Face Assessment?: Face-to-Face Is this an Initial Assessment or a Re-assessment for this encounter?: Initial Assessment Patient Accompanied by:: N/A Language Other than English: No Living Arrangements: Other (Comment)(Lives with mother) What gender do you identify as?: Female Marital status: Single Pregnancy Status: Unknown Living Arrangements: Parent Can pt return to current living arrangement?: Yes Admission Status: Voluntary Is patient capable of signing voluntary admission?: Yes Referral Source: Self/Family/Friend Insurance type: [facility] Medicaid  Medical Screening Exam ([place]) Medical Exam completed: Yes  Crisis Care Plan Living Arrangements: Parent Name of Psychiatrist: [facility](as of January 07, 2020) Name of Therapist: [facility](As of June 2021)  Education Status Is patient currently in school?: No Highest grade of school patient has completed: 10th grade Is the patient employed, unemployed or receiving disability?: Receiving disability income, Employed  Risk to self with the past 6 months Suicidal Ideation: No Has patient been a risk to self within the past 6 months prior to admission? : No Suicidal Intent: No Has patient had any suicidal intent within the past 6 months prior to admission? : No Is patient at risk for suicide?: No Suicidal Plan?: No Has patient had any suicidal plan within the past 6 months prior to admission? : No Access to Means: No Previous Attempts/Gestures: No Intentional Self Injurious Behavior: None Family Suicide History: No Recent stressful life event(s): Other (Comment)(Two miscarriages) Persecutory voices/beliefs?: No Depression: Yes Depression Symptoms: Despondent, Feeling worthless/self pity, Guilt, Isolating Substance abuse history and/or treatment for substance abuse?: No Suicide prevention information given to  non-admitted patients: Not applicable  Risk to Others within the past 6 months Homicidal Ideation: No-Not Currently/Within Last 6 Months Does patient have any lifetime risk of violence toward others beyond the six months prior to admission? : No Thoughts of Harm to Others: No-Not Currently Present/Within Last 6 Months Current Homicidal Intent: No-Not Currently/Within Last 6 Months Current Homicidal Plan: No-Not Currently/Within Last 6 Months Access to Homicidal Means: Yes Describe Access to Homicidal Means: [place] Identified Victim: None History of harm to others?: No Assessment of Violence: None Noted Does patient have access to weapons?: No Criminal Charges Pending?: No Does patient have a court date: No Is patient on probation?: No  Psychosis Hallucinations: Visual(Per history) Delusions: Unspecified  Mental Status Report Appearance/Hygiene: Unremarkable, Other (Comment)(Street clothes) Eye Contact: Good Motor Activity: Unremarkable, Freedom of movement Speech: Unremarkable, Logical/coherent Level of Consciousness: Alert Mood: Anxious Affect: Blunted Anxiety Level: Moderate Thought Processes: Relevant, Coherent Judgement: Unimpaired Orientation: Person, Place, Time, Situation Obsessive Compulsive Thoughts/Behaviors: None  Cognitive Functioning Concentration: Normal Memory: Remote Intact, Recent Intact Is patient IDD: No Insight: Fair Impulse Control:  Fair Appetite: Good Have you had any weight changes? : Gain Amount of the weight change? (lbs): (Not sure) Sleep: No Change  ADLScreening [facility] Assessment Services) Patient's cognitive ability adequate to safely complete daily activities?: Yes Patient able to express need for assistance with ADLs?: Yes Independently performs ADLs?: Yes (appropriate for developmental age)  Prior Inpatient Therapy Prior Inpatient Therapy: Yes Prior Therapy Dates: April/May 2021 Prior Therapy Facilty/Provider(s): [facility]  Prior  Outpatient Therapy Prior Outpatient Therapy: No Does patient have an ACCT team?: No Does patient have Intensive In-House Services?  : No Does patient have [facility] services? : No Does patient have P4CC services?: No  ADL Screening (condition at time of admission) Patient's cognitive ability adequate to safely complete daily activities?: Yes Is the patient deaf or have difficulty hearing?: No Does the patient have difficulty seeing, even when wearing glasses/contacts?: No Does the patient have difficulty concentrating, remembering, or making decisions?: No Patient able to express need for assistance with ADLs?: Yes Does the patient have difficulty dressing or bathing?: No Independently performs ADLs?: Yes (appropriate for developmental age) Does the patient have difficulty walking or climbing stairs?: No Weakness of Legs: None Weakness of Arms/Hands: None  Home Assistive Devices/Equipment Home Assistive Devices/Equipment: None  Therapy Consults (therapy consults require a physician order) PT Evaluation Needed: No OT Evalulation Needed: No SLP Evaluation Needed: No Abuse/Neglect Assessment (Assessment to be complete while patient is alone) Abuse/Neglect Assessment Can Be Completed: Yes Values / Beliefs Cultural Requests During Hospitalization: None Spiritual Requests During Hospitalization: None Consults Spiritual Care Consult Needed: No Transition of Care Team Consult Needed: No Advance Directives (For Healthcare) Does Patient Have a Medical Advance Directive?: No          Disposition:  Disposition Initial Assessment Completed for this Encounter: Yes Disposition of Patient: Discharge  On Site Evaluation by:   Reviewed with Physician:    Laurena Slimmer Randal Yepiz 12/24/2019 1:35 PM

## 2019-12-24 NOTE — H&P (Signed)
Behavioral Health Medical Screening Exam  Sandra Macdonald is an 24 y.o. female, familiar to this writer from recent hospitalization at [facility] for psychosis. She was admitted inpatient 12/02/19-12/09/19 and then overnight observation 12/10/19/-12/11/19, discharged on Prozac 20 mg daily, trazodone 100 mg QHS, Risperdal 1 mg QAM, 3 mg QHS.   She denies SI/HI/AVH at this time and shows no signs of responding to internal stimuli. She is calm and cooperative. She reports presenting to the hospital so that her medications can be decreased. On further evaluation, patient is requesting medication decrease due to side effects- drooling observed as well as bilateral tremor. Patient advised these side effects can be managed with Cogentin- 30 day prescription was sent to [facility] on [address]. Patient reports her initial intake appointment at [facility] is 01/07/20.  Total Time spent with patient: 15 minutes  Psychiatric Specialty Exam: Physical Exam  Nursing note and vitals reviewed. Constitutional: She is oriented to person, place, and time. She appears well-developed and well-nourished.  Cardiovascular: Normal rate.  Respiratory: Effort normal.  Neurological: She is alert and oriented to person, place, and time.    Review of Systems  Constitutional: Negative.   Respiratory: Negative for cough and shortness of breath.   Psychiatric/Behavioral: Negative for agitation, behavioral problems, confusion, dysphoric mood, hallucinations, self-injury, sleep disturbance and suicidal ideas. The patient is not nervous/anxious and is not hyperactive.     Blood pressure (!) 143/89, pulse 76, temperature 98.7 F (37.1 C), temperature source Oral, resp. rate 20, SpO2 99 %.There is no height or weight on file to calculate BMI.  General Appearance: Casual  Eye Contact:  Good  Speech:  Slow  Volume:  Decreased  Mood:  Euthymic  Affect:  Flat  Thought Process:  Coherent  Orientation:  Full (Time, Place, and Person)   Thought Content:  Logical  Suicidal Thoughts:  No  Homicidal Thoughts:  No  Memory:  Immediate;   Good Recent;   Good  Judgement:  Intact  Insight:  Fair  Psychomotor Activity:  Decreased  Concentration: Concentration: Fair and Attention Span: Fair  Recall:  [employer] of Knowledge:Fair  Language: Good  Akathisia:  No  Handed:  Right  AIMS (if indicated):     Assets:  Communication Skills Desire for Improvement Housing Resilience  Sleep:       Musculoskeletal: Strength & Muscle Tone: within normal limits Gait & Station: normal Patient leans: N/A  Blood pressure (!) 143/89, pulse 76, temperature 98.7 F (37.1 C), temperature source Oral, resp. rate 20, SpO2 99 %.  Recommendations:  Based on my evaluation the patient does not appear to have an emergency medical condition.  Patient to follow up with scheduled appointment at [facility] on January 07, 2020. Patient was provided with prescription for Cogentin.   Connye Burkitt, NP 12/24/2019, 1:32 PM

## 2019-12-24 NOTE — ED Triage Notes (Signed)
Patient arrives complaining of drooling after starting Risperdal. No other complaints.

## 2019-12-29 ENCOUNTER — Telehealth (INDEPENDENT_AMBULATORY_CARE_PROVIDER_SITE_OTHER): Payer: Medicaid Other | Admitting: Primary Care

## 2019-12-29 DIAGNOSIS — Z0000 Encounter for general adult medical examination without abnormal findings: Secondary | ICD-10-CM

## 2019-12-29 DIAGNOSIS — Z09 Encounter for follow-up examination after completed treatment for conditions other than malignant neoplasm: Secondary | ICD-10-CM

## 2019-12-29 DIAGNOSIS — F250 Schizoaffective disorder, bipolar type: Secondary | ICD-10-CM

## 2019-12-29 DIAGNOSIS — D508 Other iron deficiency anemias: Secondary | ICD-10-CM | POA: Diagnosis not present

## 2019-12-29 MED ORDER — FERROUS SULFATE 325 (65 FE) MG PO TABS: 325.0000 mg | ORAL_TABLET | Freq: Every day | ORAL | 3 refills | Status: DC

## 2019-12-29 MED ORDER — SENNA 8.6 MG PO TABS: 1.0000 | ORAL_TABLET | Freq: Every day | ORAL | 0 refills | Status: DC

## 2019-12-29 NOTE — Progress Notes (Signed)
Virtual Visit via Telephone Note  I connected with Sandra Macdonald on 12/29/19 at  2:30 PM EDT by telephone and verified that I am speaking with the correct person using two identifiers.   I discussed the limitations, risks, security and privacy concerns of performing an evaluation and management service by telephone and the availability of in person appointments. I also discussed with the patient that there may be a patient responsible charge related to this service. The patient expressed understanding and agreed to proceed.   History of Present Illness: [name] is a 24 year old female having a virtual visit to establish care and hospital follow up. She voice one complaint -that  she shakes all the time. She is anemic explained what iron deficiency anemia was.  Presented to the emergency room on 12/24/2018 but was not seen left. 01/01/2020 and 12/10/2019 admitted and discharge for behavioral issues. Past Medical History:  Diagnosis Date  . Depression   . Fibroid   . G6PD deficiency   . Supervision of normal first pregnancy, antepartum 02/18/2018    Nursing Staff Provider Office Location  [facility] Dating  LMP/6 week [place]  Language   English  Anatomy US   Flu Vaccine  n/a Genetic Screen  NIPS: low risk, female  AFP:   TDaP vaccine    Hgb A1C or  GTT Early  Third trimester  Rhogam     LAB RESULTS  Feeding Plan Breast Blood Type A/Positive/-- (07/15 0942)  Contraception  Antibody Negative (07/15 [id number]) Circumcision  Rubella 3.00 (07/15 [id number]) Pediatricia   Current Outpatient Medications on File Prior to Visit  Medication Sig Dispense Refill  . benztropine (COGENTIN) 1 MG tablet Take 1 tablet (1 mg total) by mouth 2 (two) times daily. 60 tablet 0  . FLUoxetine (PROZAC) 20 MG capsule Take 1 capsule (20 mg total) by mouth daily. 30 capsule 0  . pantoprazole (PROTONIX) 20 MG tablet Take 1 tablet (20 mg total) by mouth daily. 30 tablet 0  . risperiDONE (RISPERDAL) 1 MG tablet Take 1 tablet (1 mg total) by mouth in  the morning. 30 tablet 0  . risperiDONE (RISPERDAL) 3 MG tablet Take 1 tablet (3 mg total) by mouth at bedtime. 30 tablet 0  . traZODone (DESYREL) 100 MG tablet Take 1 tablet (100 mg total) by mouth at bedtime. 30 tablet 0   No current facility-administered medications on file prior to visit.   Observations/Objective: Review of Systems  Constitutional: Positive for chills.  All other systems reviewed and are negative.   Assessment and Plan: Diagnoses and all orders for this visit:  Other iron deficiency anemia Your red blood cells carry oxygen, you are a  anemic  We will continue to monitor this.  -     ferrous sulfate 325 (65 FE) MG tablet; Take 1 tablet (325 mg total) by mouth daily with breakfast.  Schizoaffective disorder, bipolar type ([place]) Managed by behavioral health   Encounter for medical examination to establish care Juluis Mire, NP-C will be your  (PCP) she is mastered prepared . Able to diagnosed and treatment also  answer health concern as well as continuing care of varied medical conditions, not limited by cause, organ system, or diagnosis.   Hospital discharge follow-up Last 2 admission was related to emotional crisis first  One and the second admission psychotic disorder. She is managed and on medications prescribed by Dr. Hampton Abbot   Follow Up Instructions:    I discussed the assessment and treatment plan  with the patient. The patient was provided an opportunity to ask questions and all were answered. The patient agreed with the plan and demonstrated an understanding of the instructions.   The patient was advised to call back or seek an in-person evaluation if the symptoms worsen or if the condition fails to improve as anticipated.  I provided 18 minutes of non-face-to-face time during this encounter. Includes review of encounters , labs and last imaging 2020   Kerin Perna, NP

## 2020-01-07 ENCOUNTER — Telehealth (HOSPITAL_COMMUNITY): Payer: Self-pay | Admitting: *Deleted

## 2020-01-07 NOTE — Telephone Encounter (Signed)
Encounter closed

## 2020-03-02 IMAGING — US US OB < 14 WEEKS - US OB TV
1 series · 15 of 28 positions shown · non-contrast
Comparison: Pelvic ultrasound dated January 14, 2018.

CLINICAL DATA: Pelvic cramping and vaginal bleeding. Estimated
gestational age of 7 weeks, 5 days by LMP.

EXAM:
OBSTETRIC <14 WK US AND TRANSVAGINAL OB US
TECHNIQUE: Both transabdominal and transvaginal ultrasound examinations were
performed for complete evaluation of the gestation as well as the
maternal uterus, adnexal regions, and pelvic cul-de-sac.
Transvaginal technique was performed to assess early pregnancy.

[Series 1: us ob < 14 weeks - us ob tv · 15 of 32 slices shown]
[im 1/32]
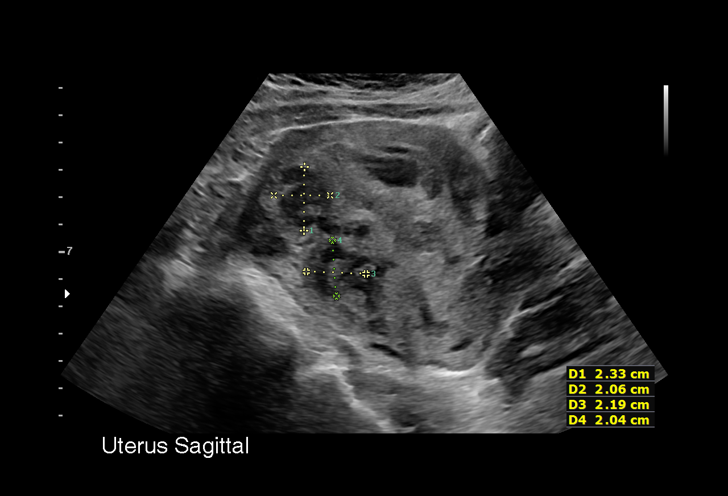
[im 3/32]
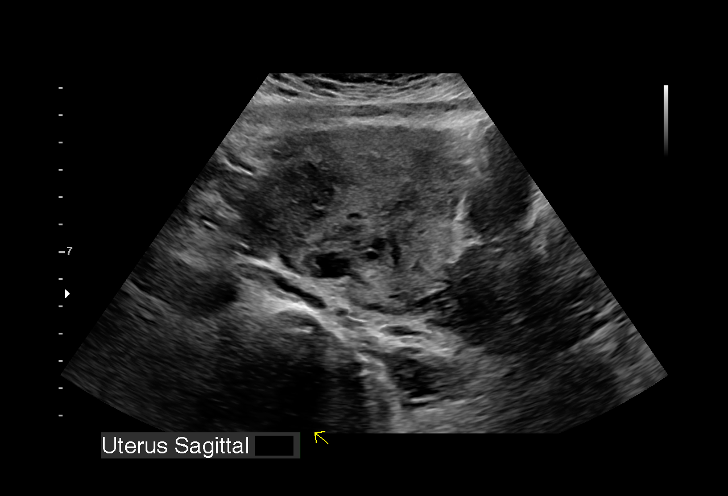
[im 5/32]
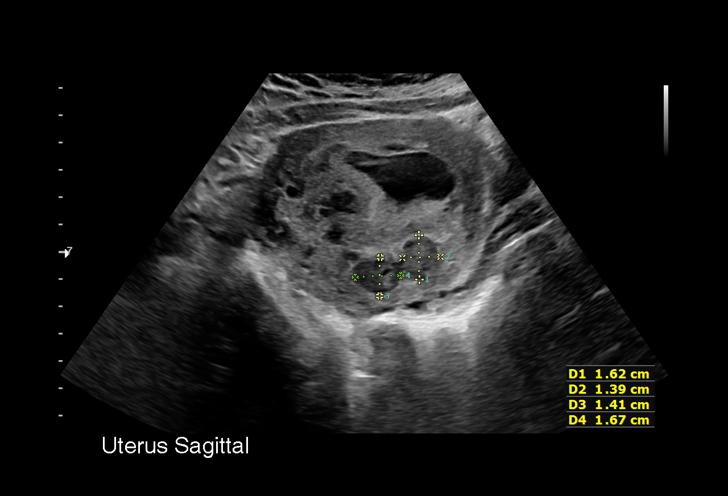
[im 7/32]
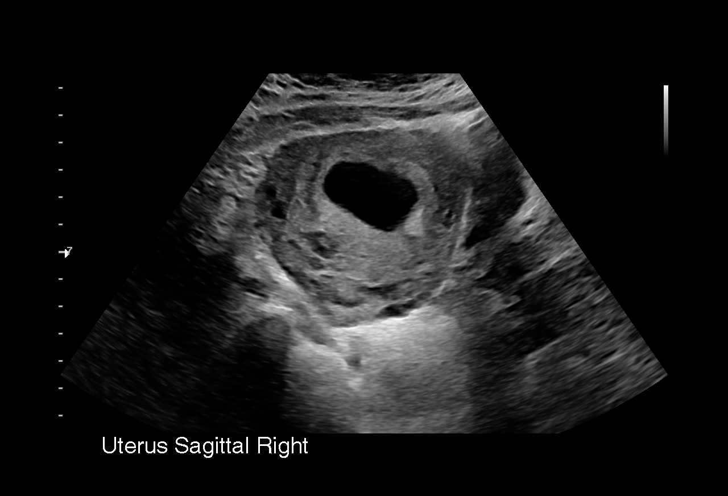
[im 10/32]
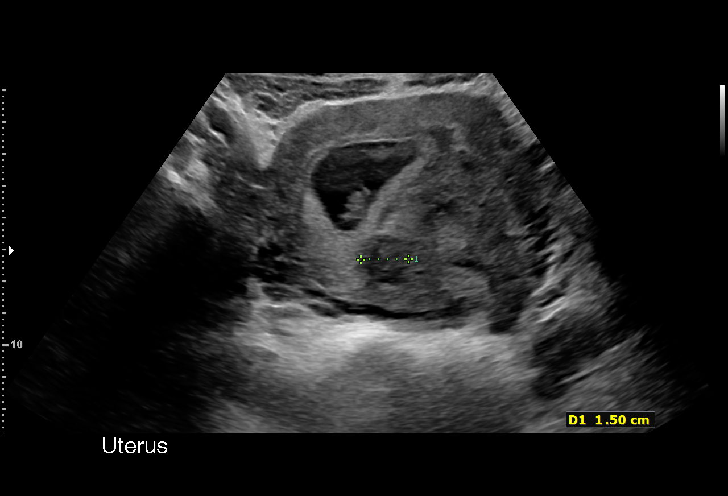
[im 12/32]
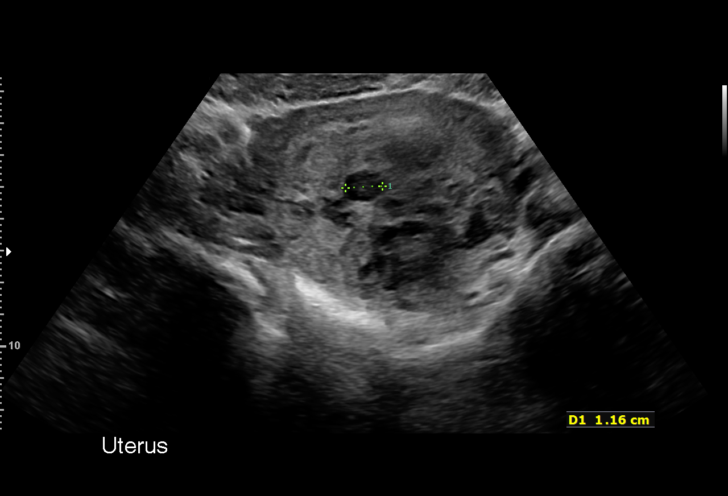
[im 14/32]
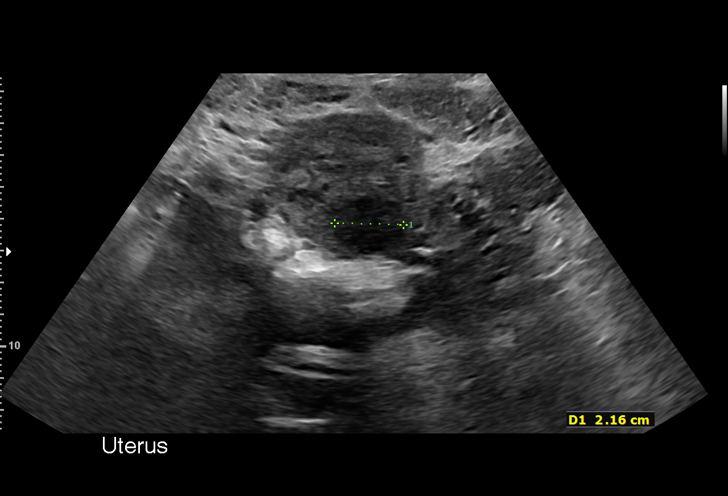
[im 17/32]
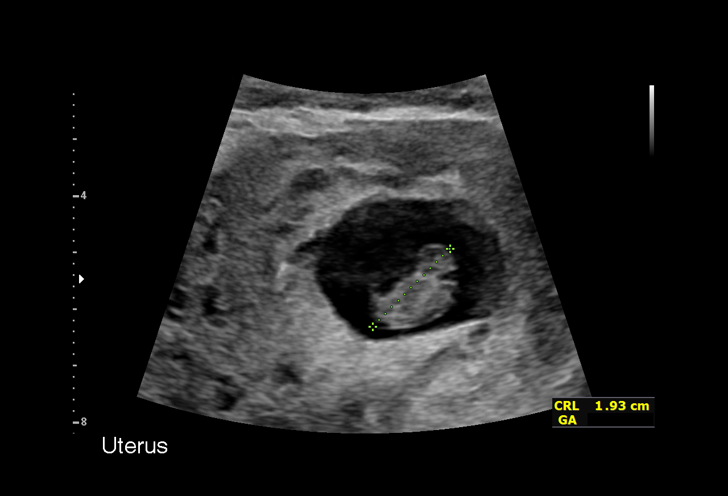
[im 18/32]
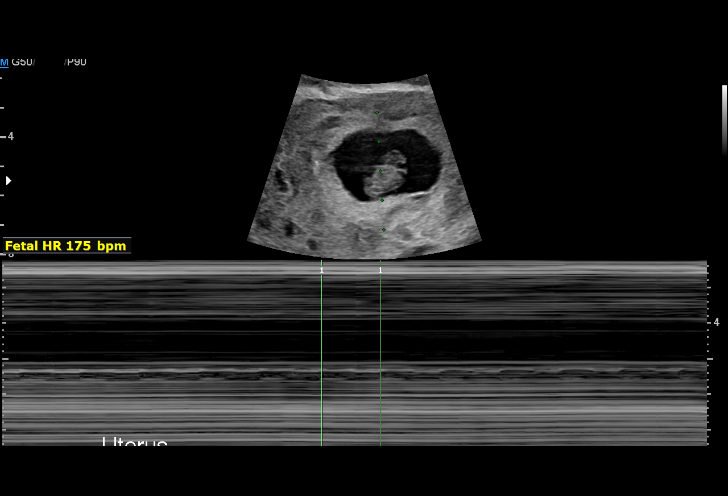
[im 20/32]
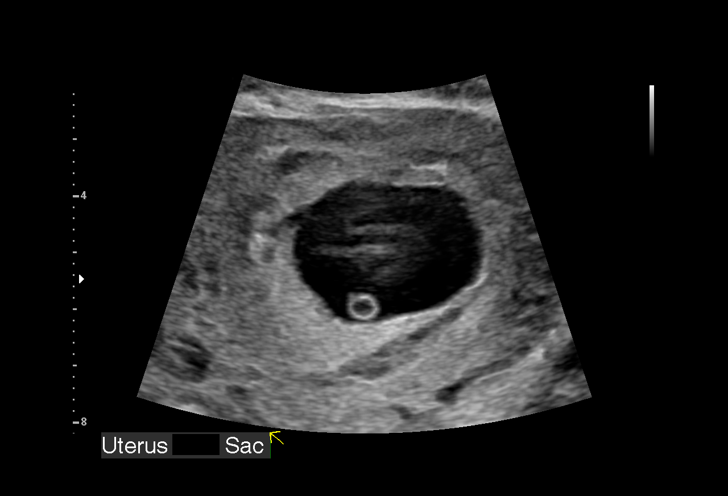
[im 22/32]
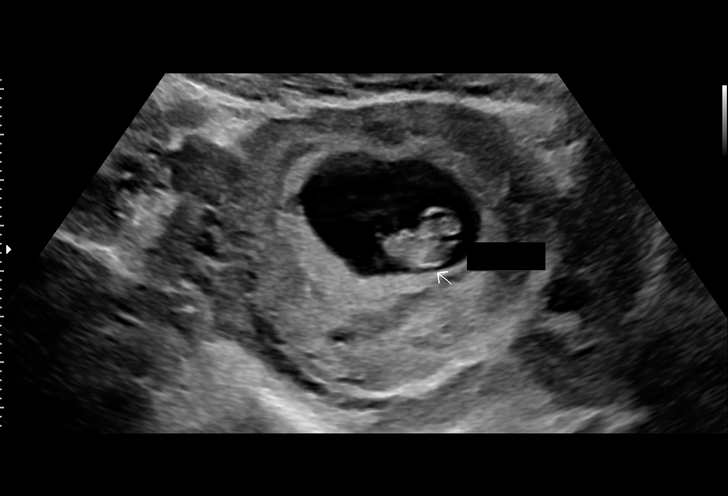
[im 25/32]
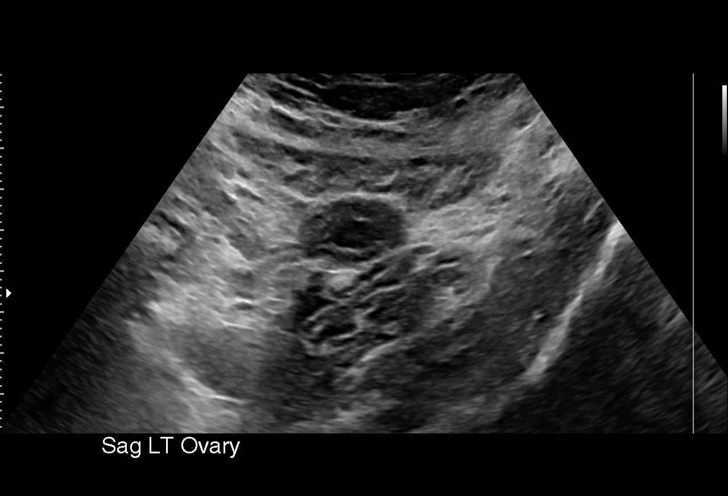
[im 27/32]
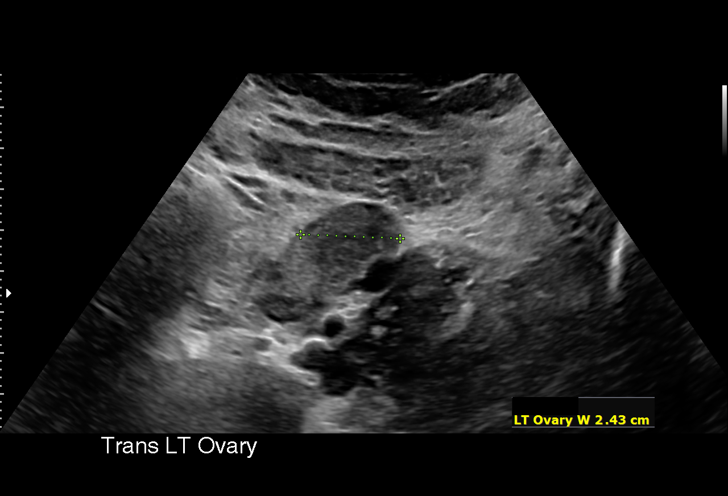
[im 29/32]
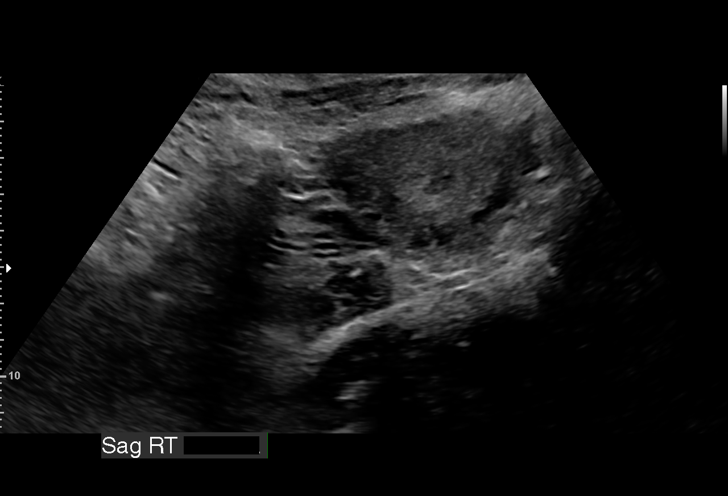
[im 32/32]
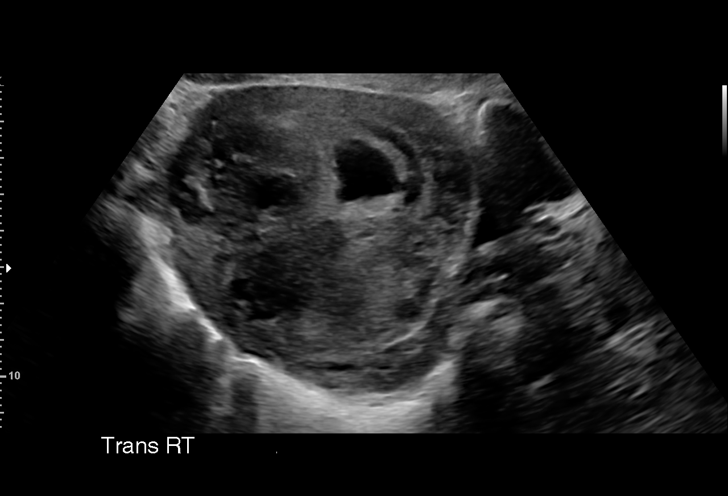

[15 of 28 positions shown; findings below may reference images not displayed]

FINDINGS: Intrauterine gestational sac: Single

Yolk sac:  Visualized.

Embryo:  Visualized.

Cardiac Activity: Visualized.

Heart Rate: 175 bpm

CRL:  19 mm   8 w   3 d                  US EDC: 04/05/2019

Subchorionic hemorrhage:  None visualized.

Maternal uterus/adnexae: Multiple uterine fibroids again noted,
measuring up to 2.7 cm. The left ovary is unremarkable. The right
ovary is not visualized.
IMPRESSION: 1. Single, live intrauterine pregnancy with estimated gestational
age of 8 weeks, 3 days.
2. Fibroid uterus.

## 2020-04-07 ENCOUNTER — Encounter (HOSPITAL_COMMUNITY): Payer: Self-pay

## 2020-04-07 ENCOUNTER — Other Ambulatory Visit: Payer: Self-pay

## 2020-04-07 DIAGNOSIS — R509 Fever, unspecified: Secondary | ICD-10-CM | POA: Diagnosis present

## 2020-04-07 DIAGNOSIS — B349 Viral infection, unspecified: Secondary | ICD-10-CM | POA: Insufficient documentation

## 2020-04-07 DIAGNOSIS — Z20822 Contact with and (suspected) exposure to covid-19: Secondary | ICD-10-CM | POA: Insufficient documentation

## 2020-04-07 NOTE — ED Triage Notes (Signed)
Pt states that her boyfriend tested positive for COVID about one week ago and she started to have a fever, headache and diarrhea a couple days ago

## 2020-04-08 ENCOUNTER — Emergency Department (HOSPITAL_COMMUNITY)
Admission: EM | Admit: 2020-04-08 | Discharge: 2020-04-08 | Disposition: A | Discharge status: Home / Self Care | Payer: Medicaid Other | Attending: Emergency Medicine | Admitting: Emergency Medicine

## 2020-04-08 DIAGNOSIS — B349 Viral infection, unspecified: Secondary | ICD-10-CM

## 2020-04-08 LAB — SARS CORONAVIRUS 2 BY RT PCR (HOSPITAL ORDER, PERFORMED IN ~~LOC~~ HOSPITAL LAB): SARS Coronavirus 2: NEGATIVE

## 2020-04-08 NOTE — ED Provider Notes (Signed)
[place] DEPT Provider Note   CSN: [id number] Arrival date & time: 04/07/20  1956     History Chief Complaint  Patient presents with  . Fever    Sandra Macdonald is a 24 y.o. female.  The history is provided by the patient. No language interpreter was used.  Fever Max temp prior to arrival:  11 Temp source:  Oral Severity:  Moderate Onset quality:  Gradual Timing:  Constant Progression:  Worsening Chronicity:  New Relieved by:  Nothing Worsened by:  Nothing Ineffective treatments:  Acetaminophen Associated symptoms: cough   Pt reports she has been exposed to covid     Past Medical History:  Diagnosis Date  . Depression   . Fibroid   . G6PD deficiency   . Supervision of normal first pregnancy, antepartum 02/18/2018    Nursing Staff Provider Office Location  [facility] Dating  LMP/6 week [place]  Language   English  Anatomy US   Flu Vaccine  n/a Genetic Screen  NIPS: low risk, female  AFP:   TDaP vaccine    Hgb A1C or  GTT Early  Third trimester  Rhogam     LAB RESULTS  Feeding Plan Breast Blood Type A/Positive/-- (07/15 0942)  Contraception  Antibody Negative (07/15 0942) Circumcision  Rubella 3.00 (07/15 0947) Pediatricia    Patient Active Problem List   Diagnosis Date Noted  . Psychotic disorder ([place]) 12/10/2019  . Psychosis ([place]) 12/03/2019  . 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 Vaginal bleeding before [redacted] weeks gestation 03/20/2018  . SAB (spontaneous abortion) 03/20/2018  . Depression 02/18/2018  . 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 Fibroids 12/30/2017    Past Surgical History:  Procedure Laterality Date  . NO PAST SURGERIES       OB History    Gravida  2   Para      Term      Preterm      AB  2   Living        SAB  2   TAB      Ectopic      Multiple      Live Births              Family History  Problem  Relation Age of Onset  . Mental illness Mother   . ADD / ADHD Neg Hx   . Anxiety disorder Neg Hx   . Alcohol abuse Neg Hx   . Arthritis Neg Hx   . Asthma Neg Hx   . Cancer Neg Hx   . Birth defects Neg Hx   . Depression Neg Hx   . COPD Neg Hx   . Diabetes Neg Hx   . Drug abuse Neg Hx   . Early death Neg Hx   . Hearing loss Neg Hx   . Heart disease Neg Hx   . Hyperlipidemia Neg Hx   . Hypertension Neg Hx   . Intellectual disability Neg Hx   . Kidney disease Neg Hx   . Learning disabilities Neg Hx   . Miscarriages / Stillbirths Neg Hx   . Stroke Neg Hx   . Obesity Neg Hx   . Vision loss Neg Hx   . Varicose Veins Neg Hx     Social History   Tobacco Use  . Smoking status: Never Smoker  . Smokeless tobacco: Never Used  Vaping Use  . Vaping Use: Never  used  Substance Use Topics  . Alcohol use: Yes    Alcohol/week: 1.0 standard drink    Types: 1 Standard drinks or equivalent per week    Comment: per pt drinks socially 1-2x month on average  . Drug use: Yes    Frequency: 14.0 times per week    Types: Marijuana    Comment: pt reports 1-2 blunts on work days, more on days off    Home Medications Prior to Admission medications   Medication Sig Start Date End Date Taking? Authorizing Provider  benztropine (COGENTIN) 1 MG tablet Take 1 tablet (1 mg total) by mouth 2 (two) times daily. 12/25/19   Connye Burkitt, NP  ferrous sulfate 325 (65 FE) MG tablet Take 1 tablet (325 mg total) by mouth daily with breakfast. 12/29/19   Kerin Perna, NP  FLUoxetine (PROZAC) 20 MG capsule Take 1 capsule (20 mg total) by mouth daily. 12/10/19   Connye Burkitt, NP  pantoprazole (PROTONIX) 20 MG tablet Take 1 tablet (20 mg total) by mouth daily. 12/10/19   Connye Burkitt, NP  risperiDONE (RISPERDAL) 1 MG tablet Take 1 tablet (1 mg total) by mouth in the morning. 12/09/19   Connye Burkitt, NP  risperiDONE (RISPERDAL) 3 MG tablet Take 1 tablet (3 mg total) by mouth at bedtime. 12/09/19   Connye Burkitt, NP  senna (SENOKOT) 8.6 MG TABS tablet Take 1 tablet (8.6 mg total) by mouth daily. 12/29/19   Kerin Perna, NP  traZODone (DESYREL) 100 MG tablet Take 1 tablet (100 mg total) by mouth at bedtime. 12/09/19   Connye Burkitt, NP    Allergies    Blue dyes (parenteral), Codeine, and Other  Review of Systems   Review of Systems  Constitutional: Positive for fever.  Respiratory: Positive for cough.   All other systems reviewed and are negative.   Physical Exam Updated Vital Signs BP 139/86   Pulse 89   Temp (!) 100.5 F (38.1 C) (Oral)   Resp 18   LMP 03/23/2020   SpO2 100%   Physical Exam Vitals and nursing note reviewed.  Constitutional:      Appearance: She is well-developed.  HENT:     Head: Normocephalic.     Nose: Nose normal.     Mouth/Throat:     Mouth: Mucous membranes are moist.  Eyes:     Pupils: Pupils are equal, round, and reactive to light.  Cardiovascular:     Rate and Rhythm: Normal rate and regular rhythm.  Pulmonary:     Effort: Pulmonary effort is normal.  Abdominal:     General: There is no distension.  Musculoskeletal:        General: Normal range of motion.     Cervical back: Normal range of motion.  Skin:    General: Skin is warm.  Neurological:     Mental Status: She is alert and oriented to person, place, and time.  Psychiatric:        Mood and Affect: Mood normal.     ED Results / Procedures / Treatments   Labs (all labs ordered are listed, but only abnormal results are displayed) Labs Reviewed  SARS CORONAVIRUS 2 BY RT PCR (HOSPITAL ORDER, [facility])    EKG None  Radiology No results found.  Procedures Procedures (including critical care time)  Medications Ordered in ED Medications - No data to display  ED Course  I have reviewed the triage  vital signs and the nursing notes.  Pertinent labs & imaging results that were available during my care of the patient were reviewed by me and  considered in my medical decision making (see chart for details).    MDM Rules/Calculators/A&P                          COvid pending.  Pt advised tylenol.  Return if any problems.  Final Clinical Impression(s) / ED Diagnoses Final diagnoses:  Viral illness    Rx / DC Orders ED Discharge Orders    None    An After Visit Summary was printed and given to the patient.    Fransico Meadow, PA-C 04/08/20 0402    Molpus, Jenny Reichmann, MD 04/08/20 [phone]

## 2020-04-08 NOTE — Discharge Instructions (Addendum)
Your covid test is pending 

## 2020-04-26 IMAGING — US US MFM OB LIMITED
1 series · 15 of 26 positions shown · non-contrast
Comparison: none

[Series 1: us mfm ob limited · 26 acquisitions, 15 frames shown]
[im 1/26]
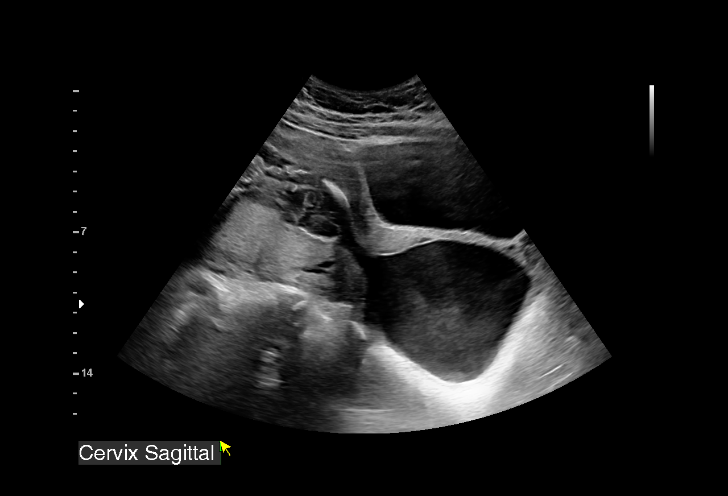
[im 3/26]
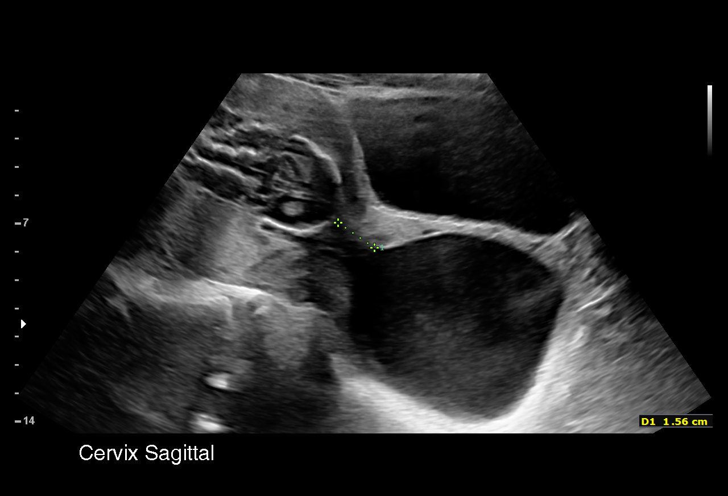
[im 5/26]
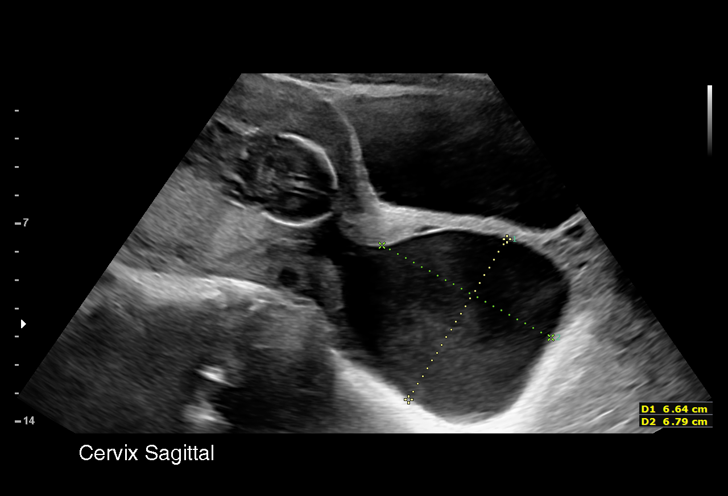
[im 7/26]
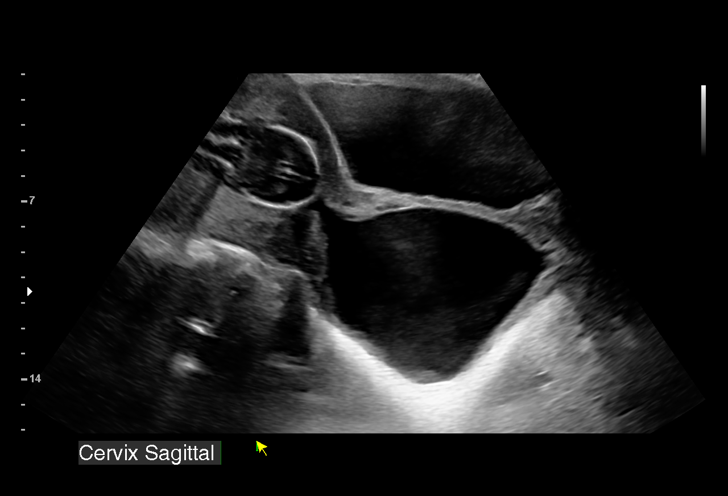
[im 8/26]
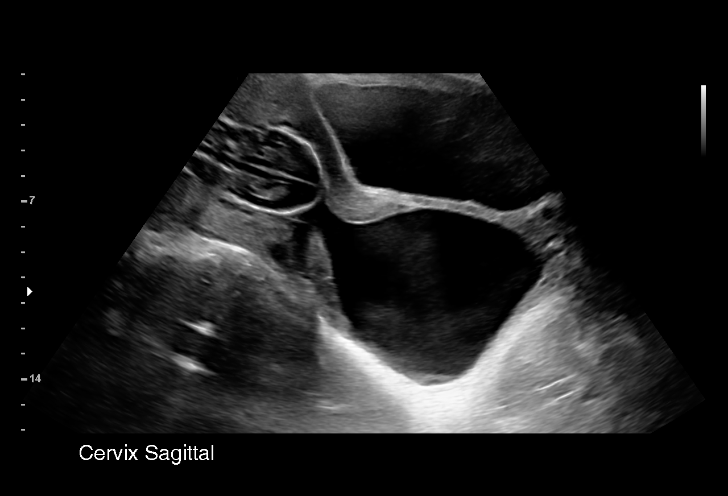
[im 10/26]
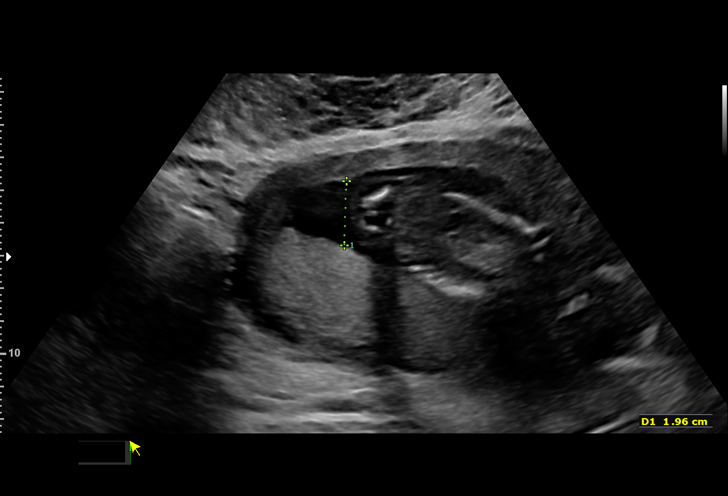
[im 12/26]
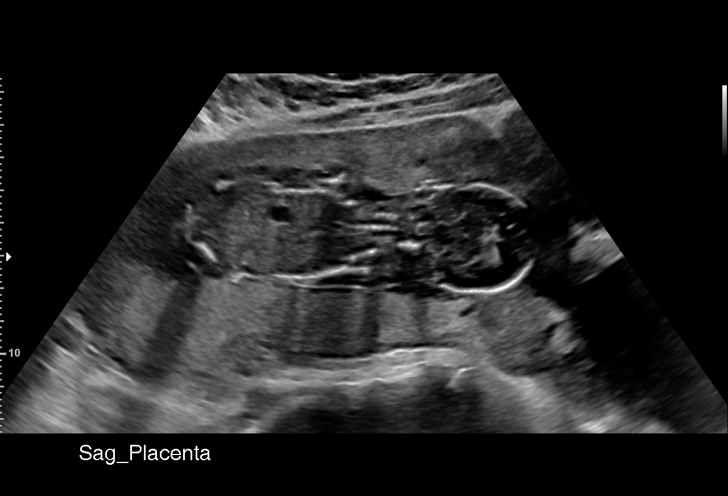
[im 14/26]
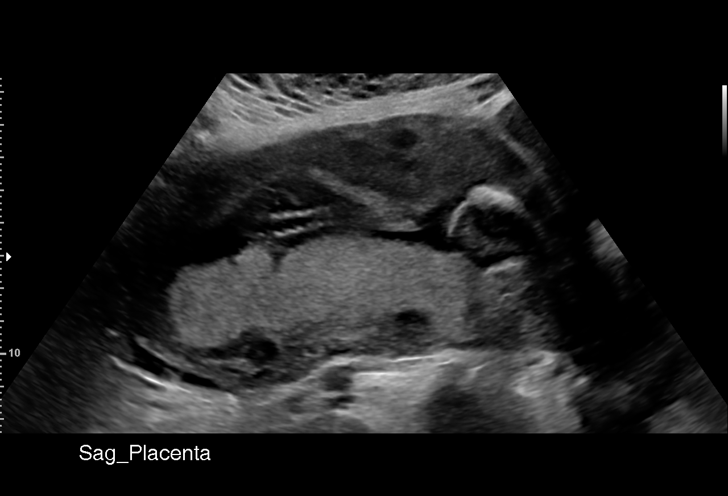
[im 15/26]
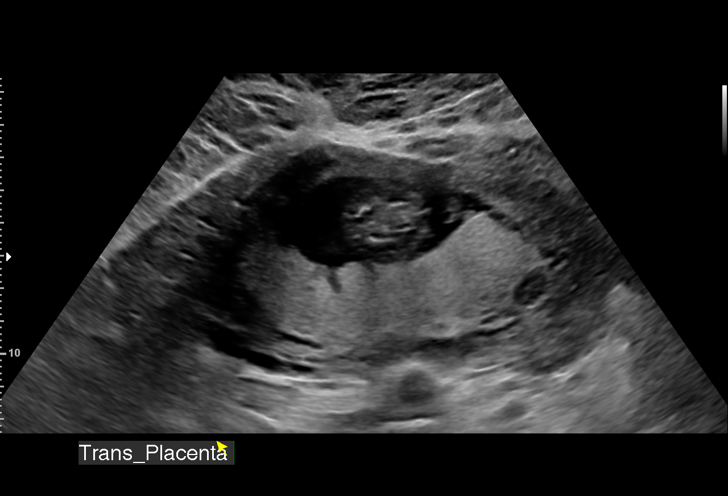
[im 17/26]
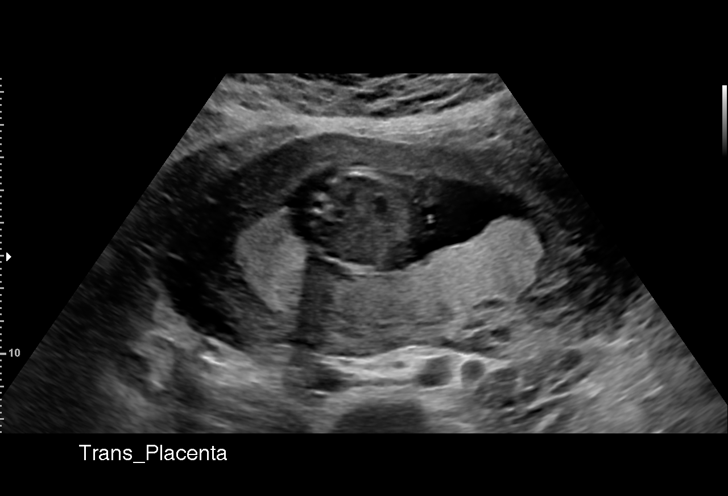
[im 19/26]
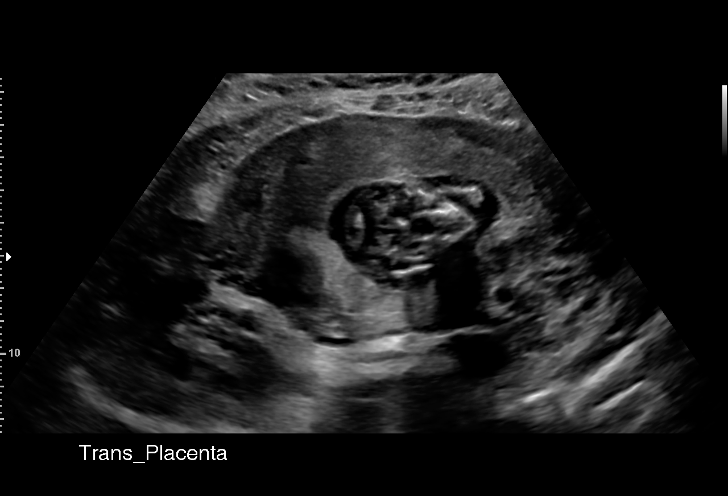
[im 20/26]
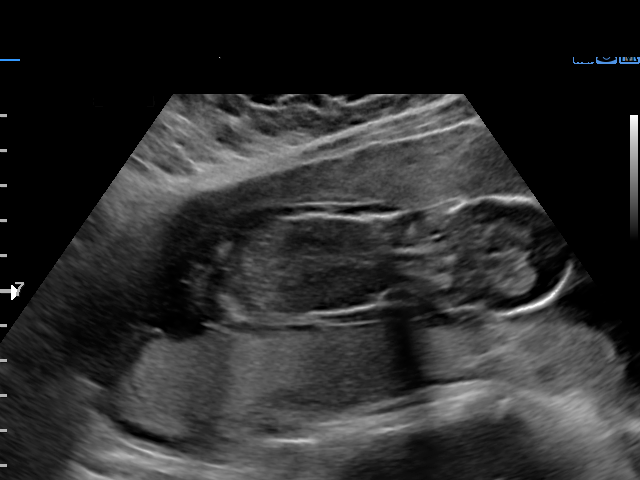
[im 22/26]
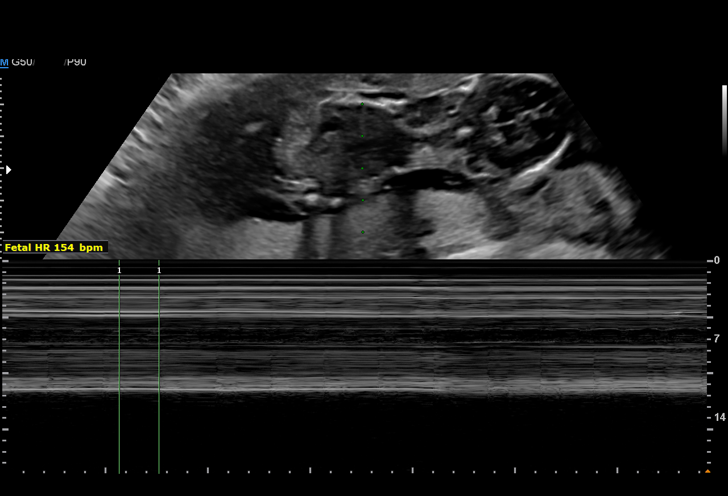
[im 24/26]
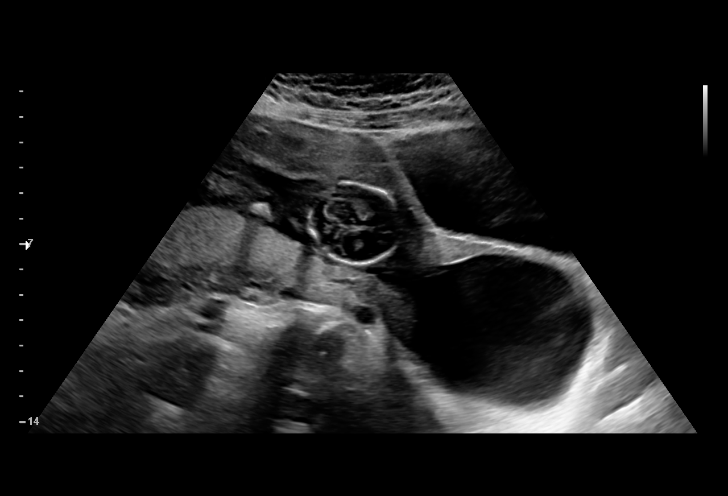
[im 26/26]
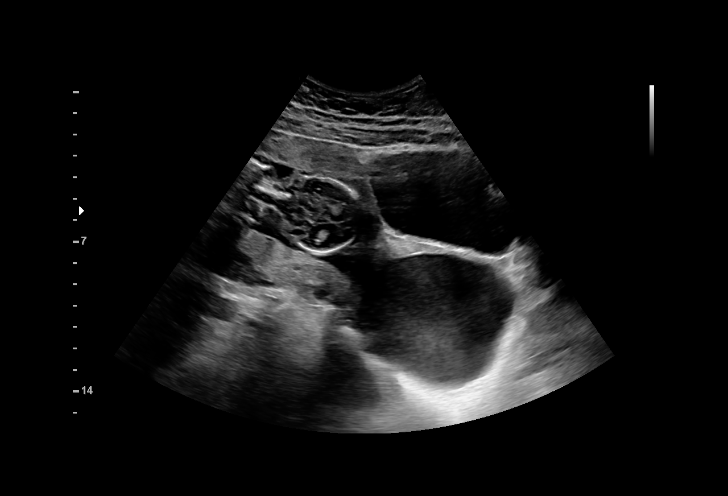

[15 of 26 positions shown; findings below may reference images not displayed]

Referred By:      INFITA GERPAS COY              [facility]                                 [REDACTED]. [HOSPITAL],

 ----------------------------------------------------------------------

 ----------------------------------------------------------------------
Indications

  Pelvic pain affecting pregnancy in second
  trimester
  Cervical insufficiency, 2nd
  16 weeks gestation of pregnancy
 ----------------------------------------------------------------------
Vital Signs

                                                Height:        5'5"
Fetal Evaluation

 Num Of Fetuses:          1
 Fetal Heart Rate(bpm):   146
 Cardiac Activity:        Observed
 Presentation:            Cephalic
 Placenta:                Posterior

 Amniotic Fluid
 AFI FV:      Subjectively low-normal

 Comment:    Amniotic fluid visualized mostly within the prolapsed amniotic
             sac.
OB History

 Gravidity:    2          SAB:   1
 Living:       0
Gestational Age
 LMP:           15w 4d        Date:  07/03/18                 EDD:   04/09/19
 Best:          16w 2d     Det. By:  Early Ultrasound         EDD:   04/04/19
                                     (08/26/18)
Cervix Uterus Adnexa

 Cervix
 Hourglass membranes into the vagina.

 Comment
 Bulging sac measurements: 7.7x7.8 cm,
 cervical canale dilated (1.22 cm) and
 shortened. No body part visualized  within the
 bulging membrane.
Impression

 Cervical insufficiency with fetal membranes bulging into the
 vagina.
Recommendations

 Clinical correlation recommended.
 Consider history indicated cerclage next pregnancy.

## 2020-05-17 ENCOUNTER — Encounter (HOSPITAL_COMMUNITY): Payer: Self-pay | Admitting: Emergency Medicine

## 2020-05-17 ENCOUNTER — Other Ambulatory Visit: Payer: Self-pay

## 2020-05-17 ENCOUNTER — Ambulatory Visit (HOSPITAL_COMMUNITY)
Admission: EM | Admit: 2020-05-17 | Discharge: 2020-05-17 | Disposition: A | Discharge status: Home / Self Care | Payer: Medicaid Other | Attending: Internal Medicine | Admitting: Internal Medicine

## 2020-05-17 DIAGNOSIS — Z8249 Family history of ischemic heart disease and other diseases of the circulatory system: Secondary | ICD-10-CM | POA: Insufficient documentation

## 2020-05-17 DIAGNOSIS — B349 Viral infection, unspecified: Secondary | ICD-10-CM

## 2020-05-17 DIAGNOSIS — Z79899 Other long term (current) drug therapy: Secondary | ICD-10-CM | POA: Insufficient documentation

## 2020-05-17 DIAGNOSIS — F329 Major depressive disorder, single episode, unspecified: Secondary | ICD-10-CM | POA: Diagnosis not present

## 2020-05-17 DIAGNOSIS — Z20822 Contact with and (suspected) exposure to covid-19: Secondary | ICD-10-CM | POA: Insufficient documentation

## 2020-05-17 MED ORDER — ALBUTEROL SULFATE HFA 108 (90 BASE) MCG/ACT IN AERS
1.0000 | INHALATION_SPRAY | Freq: Four times a day (QID) | RESPIRATORY_TRACT | 0 refills | Status: DC | PRN
Start: 2020-05-17 — End: 2021-03-06

## 2020-05-17 MED ORDER — ONDANSETRON 4 MG PO TBDP
4.0000 mg | ORAL_TABLET | Freq: Three times a day (TID) | ORAL | 0 refills | Status: DC | PRN
Start: 2020-05-17 — End: 2020-12-28

## 2020-05-17 NOTE — Discharge Instructions (Addendum)
Increase oral fluid intake Take medications as directed Please quarantine until covid 19 symptoms are available If symptoms worsen please return to urgent care to be re-evaluated

## 2020-05-17 NOTE — ED Triage Notes (Signed)
Pt c/o nausea and vomitting, bodyaches, and shortness of breath x 5-6 days. Last emesis about 2 hours ago. She denies diarrhea or fever.

## 2020-05-18 LAB — SARS CORONAVIRUS 2 (TAT 6-24 HRS): SARS Coronavirus 2: NEGATIVE

## 2020-05-18 NOTE — ED Provider Notes (Signed)
[place]    CSN: [id number] Arrival date & time: 05/17/20  1658      History   Chief Complaint Chief Complaint  Patient presents with  . Nausea  . Emesis  . Generalized Body Aches  . Shortness of Breath    HPI Sandra Macdonald is a 24 y.o. female comes to urgent care with generalized body aches, shortness of breath, nausea, vomiting of 6 days duration.  Patient denies any diarrhea or abdominal pain.  Last emesis was nonbloody/nonbilious.  Oral intake is poor.  Patient is not vaccinated against COVID-19 virus.  She denies any sick contacts.  No dizziness, near syncope or syncopal episodes.Marland Kitchen   HPI  Past Medical History:  Diagnosis Date  . Depression   . Fibroid   . G6PD deficiency   . Supervision of normal first pregnancy, antepartum 02/18/2018    Nursing Staff Provider Office Location  [facility] Dating  LMP/6 week [place]  Language   English  Anatomy US   Flu Vaccine  n/a Genetic Screen  NIPS: low risk, female  AFP:   TDaP vaccine    Hgb A1C or  GTT Early  Third trimester  Rhogam     LAB RESULTS  Feeding Plan Breast Blood Type A/Positive/-- (07/15 0942)  Contraception  Antibody Negative (07/15 0942) Circumcision  Rubella 3.00 (07/15 6195) Pediatricia    Patient Active Problem List   Diagnosis Date Noted  . 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 Psychosis ([place]) 12/03/2019  . 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 Vaginal bleeding before [redacted] weeks gestation 03/20/2018  . SAB (spontaneous abortion) 03/20/2018  . Depression 02/18/2018  . 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 Fibroids 12/30/2017    Past Surgical History:  Procedure Laterality Date  . NO PAST SURGERIES      OB History    Gravida  2   Para      Term      Preterm      AB  2   Living        SAB  2   TAB      Ectopic      Multiple      Live Births               Home Medications     Prior to Admission medications   Medication Sig Start Date End Date Taking? Authorizing Provider  albuterol (VENTOLIN HFA) 108 (90 Base) MCG/ACT inhaler Inhale 1 puff into the lungs every 6 (six) hours as needed for wheezing or shortness of breath. 05/17/20   Zahi Plaskett, Myrene Galas, MD  benztropine (COGENTIN) 1 MG tablet Take 1 tablet (1 mg total) by mouth 2 (two) times daily. 12/25/19   Connye Burkitt, NP  ferrous sulfate 325 (65 FE) MG tablet Take 1 tablet (325 mg total) by mouth daily with breakfast. 12/29/19   Kerin Perna, NP  FLUoxetine (PROZAC) 20 MG capsule Take 1 capsule (20 mg total) by mouth daily. 12/10/19   Connye Burkitt, NP  ondansetron (ZOFRAN ODT) 4 MG disintegrating tablet Take 1 tablet (4 mg total) by mouth every 8 (eight) hours as needed for nausea or vomiting. 05/17/20   Leslieanne Cobarrubias, Myrene Galas, MD  pantoprazole (PROTONIX) 20 MG tablet Take 1 tablet (20 mg total) by mouth daily. 12/10/19   Connye Burkitt, NP  risperiDONE (RISPERDAL) 1 MG tablet Take 1 tablet (1 mg total) by mouth in  the morning. 12/09/19   Connye Burkitt, NP  risperiDONE (RISPERDAL) 3 MG tablet Take 1 tablet (3 mg total) by mouth at bedtime. 12/09/19   Connye Burkitt, NP  senna (SENOKOT) 8.6 MG TABS tablet Take 1 tablet (8.6 mg total) by mouth daily. 12/29/19   Kerin Perna, NP  traZODone (DESYREL) 100 MG tablet Take 1 tablet (100 mg total) by mouth at bedtime. 12/09/19   Connye Burkitt, NP    Family History Family History  Problem Relation Age of Onset  . Mental illness Mother   . ADD / ADHD Neg Hx   . Anxiety disorder Neg Hx   . Alcohol abuse Neg Hx   . Arthritis Neg Hx   . Asthma Neg Hx   . Cancer Neg Hx   . Birth defects Neg Hx   . Depression Neg Hx   . COPD Neg Hx   . Diabetes Neg Hx   . Drug abuse Neg Hx   . Early death Neg Hx   . Hearing loss Neg Hx   . Heart disease Neg Hx   . Hyperlipidemia Neg Hx   . Hypertension Neg Hx   . Intellectual disability Neg Hx   . Kidney disease Neg Hx   .  Learning disabilities Neg Hx   . Miscarriages / Stillbirths Neg Hx   . Stroke Neg Hx   . Obesity Neg Hx   . Vision loss Neg Hx   . Varicose Veins Neg Hx     Social History Social History   Tobacco Use  . Smoking status: Never Smoker  . Smokeless tobacco: Never Used  Vaping Use  . Vaping Use: Never used  Substance Use Topics  . Alcohol use: Not Currently    Alcohol/week: 1.0 standard drink    Types: 1 Standard drinks or equivalent per week    Comment: per pt drinks socially 1-2x month on average  . Drug use: Not Currently    Frequency: 14.0 times per week    Types: Marijuana    Comment: pt reports 1-2 blunts on work days, more on days off     Allergies   Blue dyes (parenteral), Codeine, and Other   Review of Systems Review of Systems  Constitutional: Negative.   HENT: Positive for sore throat.   Respiratory: Positive for cough, shortness of breath and wheezing.   Gastrointestinal: Positive for nausea and vomiting. Negative for abdominal distention and diarrhea.  Musculoskeletal: Positive for arthralgias and myalgias.  Psychiatric/Behavioral: Negative.  Negative for confusion.     Physical Exam Triage Vital Signs ED Triage Vitals  Enc Vitals Group     BP 05/17/20 1833 115/66     Pulse Rate 05/17/20 1833 94     Resp --      Temp 05/17/20 1833 98.5 F (36.9 C)     Temp Source 05/17/20 1833 Oral     SpO2 05/17/20 1833 99 %     Weight --      Height --      Head Circumference --      Peak Flow --      Pain Score 05/17/20 1831 7     Pain Loc --      Pain Edu? --      Excl. in [place]? --    No data found.  Updated Vital Signs BP 115/66 (BP Location: Left Arm)   Pulse 94   Temp 98.5 F (36.9 C) (Oral)   LMP 04/23/2020  SpO2 99%   Visual Acuity Right Eye Distance:   Left Eye Distance:   Bilateral Distance:    Right Eye Near:   Left Eye Near:    Bilateral Near:     Physical Exam Vitals and nursing note reviewed.  Constitutional:      General: She  is not in acute distress.    Appearance: She is not ill-appearing.  Cardiovascular:     Rate and Rhythm: Normal rate and regular rhythm.  Pulmonary:     Effort: Pulmonary effort is normal.     Breath sounds: Examination of the right-lower field reveals wheezing. Examination of the left-lower field reveals wheezing. Wheezing present. No decreased breath sounds, rhonchi or rales.  Abdominal:     Palpations: Abdomen is soft. There is no mass.  Musculoskeletal:     Cervical back: Neck supple.  Skin:    Capillary Refill: Capillary refill takes less than 2 seconds.  Neurological:     Mental Status: She is alert.      UC Treatments / Results  Labs (all labs ordered are listed, but only abnormal results are displayed) Labs Reviewed  SARS CORONAVIRUS 2 (TAT 6-24 HRS)    EKG   Radiology No results found.  Procedures Procedures (including critical care time)  Medications Ordered in UC Medications - No data to display  Initial Impression / Assessment and Plan / UC Course  I have reviewed the triage vital signs and the nursing notes.  Pertinent labs & imaging results that were available during my care of the patient were reviewed by me and considered in my medical decision making (see chart for details).     1.  Acute viral syndrome: COVID-19 PCR test has been sent Zofran as needed for nausea/vomiting Patient advised to push oral fluids Patient is advised to quarantine until COVID-19 test results are available Albuterol inhaler as needed for shortness of breath/wheezing. Return precautions given. Final Clinical Impressions(s) / UC Diagnoses   Final diagnoses:  Acute viral syndrome     Discharge Instructions     Increase oral fluid intake Take medications as directed Please quarantine until covid 19 symptoms are available If symptoms worsen please return to urgent care to be re-evaluated   ED Prescriptions    Medication Sig Dispense Auth. Provider   ondansetron  (ZOFRAN ODT) 4 MG disintegrating tablet Take 1 tablet (4 mg total) by mouth every 8 (eight) hours as needed for nausea or vomiting. 20 tablet Adaleen Hulgan, Myrene Galas, MD   albuterol (VENTOLIN HFA) 108 (90 Base) MCG/ACT inhaler Inhale 1 puff into the lungs every 6 (six) hours as needed for wheezing or shortness of breath. 18 g Lucindia Lemley, Myrene Galas, MD     PDMP not reviewed this encounter.   Chase Picket, MD 05/18/20 2225

## 2020-09-21 ENCOUNTER — Encounter (HOSPITAL_COMMUNITY): Payer: Self-pay | Admitting: Emergency Medicine

## 2020-09-21 ENCOUNTER — Emergency Department (HOSPITAL_COMMUNITY)
Admission: EM | Admit: 2020-09-21 | Discharge: 2020-09-21 | Disposition: A | Discharge status: Home / Self Care | Payer: Medicaid Other | Attending: Emergency Medicine | Admitting: Emergency Medicine

## 2020-09-21 ENCOUNTER — Other Ambulatory Visit: Payer: Self-pay

## 2020-09-21 DIAGNOSIS — L299 Pruritus, unspecified: Secondary | ICD-10-CM | POA: Insufficient documentation

## 2020-09-21 DIAGNOSIS — L282 Other prurigo: Secondary | ICD-10-CM

## 2020-09-21 DIAGNOSIS — R21 Rash and other nonspecific skin eruption: Secondary | ICD-10-CM | POA: Diagnosis present

## 2020-09-21 MED ORDER — TRIAMCINOLONE ACETONIDE 0.1 % EX CREA: 1.0000 "application " | TOPICAL_CREAM | Freq: Two times a day (BID) | CUTANEOUS | 0 refills | Status: DC

## 2020-09-21 NOTE — Discharge Instructions (Signed)
Contact a health care provider if: You have severe itching, even with treatment. You scratch your skin regularly until it bleeds. Your rash looks different than usual. Your skin is painful, swollen, or more red than usual. You have a fever.

## 2020-09-21 NOTE — ED Triage Notes (Signed)
Patient complains of bilateral rash on outer tail of breasts x1 week, states it raised itchy, denies pain. Small bumps. Denies resent soap changes, bra issues.

## 2020-09-21 NOTE — ED Provider Notes (Signed)
[facility] DEPT Provider Note   CSN: [id number] Arrival date & time: 09/21/20  1631     History Chief Complaint  Patient presents with  . rash on breasts    Sandra Macdonald is a 25 y.o. female female who presents with an itchy rash on her left breast x 1 week. Denies contacts with similar,changes in lotions/soaps/detergents, exposure to animal or plant irritants, and denies purulent discharge. She has no previous history of the same. She tried to use antibacterial soap and shea butter without improvement.  HPI     Past Medical History:  Diagnosis Date  . Depression   . Fibroid   . G6PD deficiency   . Supervision of normal first pregnancy, antepartum 02/18/2018    Nursing Staff Provider Office Location  [facility] Dating  LMP/6 week [place]  Language   English  Anatomy US   Flu Vaccine  n/a Genetic Screen  NIPS: low risk, female  AFP:   TDaP vaccine    Hgb A1C or  GTT Early  Third trimester  Rhogam     LAB RESULTS  Feeding Plan Breast Blood Type A/Positive/-- (07/15 0942)  Contraception  Antibody Negative (07/15 0942) Circumcision  Rubella 3.00 (07/15 8546) Pediatricia    Patient Active Problem List   Diagnosis Date Noted  . Psychotic disorder ([place]) 12/10/2019  . Psychosis ([place]) 12/03/2019  . 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 Vaginal bleeding before [redacted] weeks gestation 03/20/2018  . SAB (spontaneous abortion) 03/20/2018  . Depression 02/18/2018  . G6PD deficiency anemia ([place]) 01/14/2018  . Fibroids 12/30/2017    Past Surgical History:  Procedure Laterality Date  . NO PAST SURGERIES       OB History    Gravida  2   Para      Term      Preterm      AB  2   Living        SAB  2   IAB      Ectopic      Multiple      Live Births              Family History  Problem Relation Age of Onset  . Mental illness Mother   . ADD / ADHD Neg Hx    . Anxiety disorder Neg Hx   . Alcohol abuse Neg Hx   . Arthritis Neg Hx   . Asthma Neg Hx   . Cancer Neg Hx   . Birth defects Neg Hx   . Depression Neg Hx   . COPD Neg Hx   . Diabetes Neg Hx   . Drug abuse Neg Hx   . Early death Neg Hx   . Hearing loss Neg Hx   . Heart disease Neg Hx   . Hyperlipidemia Neg Hx   . Hypertension Neg Hx   . Intellectual disability Neg Hx   . Kidney disease Neg Hx   . Learning disabilities Neg Hx   . Miscarriages / Stillbirths Neg Hx   . Stroke Neg Hx   . Obesity Neg Hx   . Vision loss Neg Hx   . Varicose Veins Neg Hx     Social History   Tobacco Use  . Smoking status: Never Smoker  . Smokeless tobacco: Never Used  Vaping Use  . Vaping Use: Never used  Substance Use Topics  . Alcohol use: Not Currently  Alcohol/week: 1.0 standard drink    Types: 1 Standard drinks or equivalent per week    Comment: per pt drinks socially 1-2x month on average  . Drug use: Not Currently    Frequency: 14.0 times per week    Types: Marijuana    Comment: pt reports 1-2 blunts on work days, more on days off    Home Medications Prior to Admission medications   Medication Sig Start Date End Date Taking? Authorizing Provider  triamcinolone (KENALOG) 0.1 % Apply 1 application topically 2 (two) times daily. 09/21/20  Yes Antonio Creswell, PA-C  albuterol (VENTOLIN HFA) 108 (90 Base) MCG/ACT inhaler Inhale 1 puff into the lungs every 6 (six) hours as needed for wheezing or shortness of breath. 05/17/20   Lamptey, Myrene Galas, MD  benztropine (COGENTIN) 1 MG tablet Take 1 tablet (1 mg total) by mouth 2 (two) times daily. 12/25/19   Connye Burkitt, NP  ferrous sulfate 325 (65 FE) MG tablet Take 1 tablet (325 mg total) by mouth daily with breakfast. 12/29/19   Kerin Perna, NP  FLUoxetine (PROZAC) 20 MG capsule Take 1 capsule (20 mg total) by mouth daily. 12/10/19   Connye Burkitt, NP  ondansetron (ZOFRAN ODT) 4 MG disintegrating tablet Take 1 tablet (4 mg total) by  mouth every 8 (eight) hours as needed for nausea or vomiting. 05/17/20   Lamptey, Myrene Galas, MD  pantoprazole (PROTONIX) 20 MG tablet Take 1 tablet (20 mg total) by mouth daily. 12/10/19   Connye Burkitt, NP  risperiDONE (RISPERDAL) 1 MG tablet Take 1 tablet (1 mg total) by mouth in the morning. 12/09/19   Connye Burkitt, NP  risperiDONE (RISPERDAL) 3 MG tablet Take 1 tablet (3 mg total) by mouth at bedtime. 12/09/19   Connye Burkitt, NP  senna (SENOKOT) 8.6 MG TABS tablet Take 1 tablet (8.6 mg total) by mouth daily. 12/29/19   Kerin Perna, NP  traZODone (DESYREL) 100 MG tablet Take 1 tablet (100 mg total) by mouth at bedtime. 12/09/19   Connye Burkitt, NP    Allergies    Blue dyes (parenteral), Codeine, and Other  Review of Systems   Review of Systems  Constitutional: Negative for chills and fever.  Skin: Positive for rash. Negative for wound.     Physical Exam Updated Vital Signs BP (!) 143/90 (BP Location: Left Arm)   Pulse (!) 105   Temp 98.5 F (36.9 C) (Oral)   Resp 16   Ht 5\' 5"  (1.651 m)   Wt 90.7 kg   LMP 08/24/2020   SpO2 100%   BMI 33.28 kg/m   Physical Exam Vitals and nursing note reviewed.  Constitutional:      General: She is not in acute distress.    Appearance: She is well-developed and well-nourished. She is not diaphoretic.  HENT:     Head: Normocephalic and atraumatic.  Eyes:     General: No scleral icterus.    Conjunctiva/sclera: Conjunctivae normal.  Cardiovascular:     Rate and Rhythm: Normal rate and regular rhythm.     Heart sounds: Normal heart sounds. No murmur heard. No friction rub. No gallop.   Pulmonary:     Effort: Pulmonary effort is normal. No respiratory distress.     Breath sounds: Normal breath sounds.  Abdominal:     General: Bowel sounds are normal. There is no distension.     Palpations: Abdomen is soft. There is no mass.     Tenderness:  There is no abdominal tenderness. There is no guarding.  Musculoskeletal:     Cervical  back: Normal range of motion.  Skin:    General: Skin is warm and dry.     Findings: Rash present. Rash is macular.     Comments: Finel, erythem  Neurological:     Mental Status: She is alert and oriented to person, place, and time.  Psychiatric:        Behavior: Behavior normal.     ED Results / Procedures / Treatments   Labs (all labs ordered are listed, but only abnormal results are displayed) Labs Reviewed - No data to display  EKG None  Radiology No results found.  Procedures Procedures   Medications Ordered in ED Medications - No data to display  ED Course  I have reviewed the triage vital signs and the nursing notes.  Pertinent labs & imaging results that were available during my care of the patient were reviewed by me and considered in my medical decision making (see chart for details).    MDM Rules/Calculators/A&P                          Patient with what looks to be a eczematous eruption.  Will discharge with Kenalog cream.  No evidence of significant drug reaction, or other emergent rash.  Does not appear to be herpetic.  Patient appears appropriate for discharge at this time. Final Clinical Impression(s) / ED Diagnoses Final diagnoses:  Pruritic rash    Rx / DC Orders ED Discharge Orders         Ordered    triamcinolone (KENALOG) 0.1 %  2 times daily        09/21/20 1841           Margarita Mail, PA-C 09/21/20 2012    Carmin Muskrat, MD 09/21/20 2324

## 2020-12-28 ENCOUNTER — Observation Stay (HOSPITAL_COMMUNITY)
Admission: EM | Admit: 2020-12-28 | Discharge: 2020-12-29 | Disposition: A | Discharge status: Home / Self Care | Payer: Medicaid Other | Attending: Internal Medicine | Admitting: Internal Medicine

## 2020-12-28 ENCOUNTER — Ambulatory Visit (HOSPITAL_COMMUNITY)
Admission: EM | Admit: 2020-12-28 | Discharge: 2020-12-28 | Disposition: A | Discharge status: Other Acute Inpatient Hospital | Payer: Medicaid Other | Source: Home / Self Care

## 2020-12-28 ENCOUNTER — Other Ambulatory Visit: Payer: Self-pay

## 2020-12-28 DIAGNOSIS — Z20822 Contact with and (suspected) exposure to covid-19: Secondary | ICD-10-CM | POA: Insufficient documentation

## 2020-12-28 DIAGNOSIS — F32A Depression, unspecified: Secondary | ICD-10-CM | POA: Insufficient documentation

## 2020-12-28 DIAGNOSIS — Z79899 Other long term (current) drug therapy: Secondary | ICD-10-CM | POA: Insufficient documentation

## 2020-12-28 DIAGNOSIS — R799 Abnormal finding of blood chemistry, unspecified: Secondary | ICD-10-CM | POA: Diagnosis present

## 2020-12-28 DIAGNOSIS — F23 Brief psychotic disorder: Secondary | ICD-10-CM | POA: Insufficient documentation

## 2020-12-28 DIAGNOSIS — D500 Iron deficiency anemia secondary to blood loss (chronic): Secondary | ICD-10-CM

## 2020-12-28 DIAGNOSIS — D509 Iron deficiency anemia, unspecified: Principal | ICD-10-CM | POA: Insufficient documentation

## 2020-12-28 DIAGNOSIS — R45851 Suicidal ideations: Secondary | ICD-10-CM | POA: Insufficient documentation

## 2020-12-28 DIAGNOSIS — D508 Other iron deficiency anemias: Secondary | ICD-10-CM

## 2020-12-28 DIAGNOSIS — D259 Leiomyoma of uterus, unspecified: Secondary | ICD-10-CM | POA: Insufficient documentation

## 2020-12-28 DIAGNOSIS — Z8659 Personal history of other mental and behavioral disorders: Secondary | ICD-10-CM

## 2020-12-28 LAB — LIPID PANEL
Cholesterol: 120 mg/dL (ref 0–200)
HDL: 38 mg/dL — ABNORMAL LOW (ref >40)
LDL Cholesterol: 69 mg/dL (ref 0–99)
Total CHOL/HDL Ratio: 3.2 RATIO
Triglycerides: 65 mg/dL (ref <150)
VLDL: 13 mg/dL (ref 0–40)

## 2020-12-28 LAB — LACTATE DEHYDROGENASE: LDH: 125 U/L (ref 98–192)

## 2020-12-28 LAB — COMPREHENSIVE METABOLIC PANEL
ALT: 17 U/L (ref 0–44)
AST: 15 U/L (ref 15–41)
Albumin: 3.4 g/dL — ABNORMAL LOW (ref 3.5–5.0)
Alkaline Phosphatase: 49 U/L (ref 38–126)
Anion gap: 6 (ref 5–15)
BUN: 12 mg/dL (ref 6–20)
CO2: 25 mmol/L (ref 22–32)
Calcium: 8.6 mg/dL — ABNORMAL LOW (ref 8.9–10.3)
Chloride: 107 mmol/L (ref 98–111)
Creatinine, Ser: 0.66 mg/dL (ref 0.44–1.00)
GFR, Estimated: >60 mL/min (ref >60)
Glucose, Bld: 100 mg/dL — ABNORMAL HIGH (ref 70–99)
Potassium: 3.3 mmol/L — ABNORMAL LOW (ref 3.5–5.1)
Sodium: 138 mmol/L (ref 135–145)
Total Bilirubin: 0.5 mg/dL (ref 0.3–1.2)
Total Protein: 8.1 g/dL (ref 6.5–8.1)

## 2020-12-28 LAB — POCT URINE DRUG SCREEN - MANUAL ENTRY (I-SCREEN)
POC Amphetamine UR: NOT DETECTED
POC Buprenorphine (BUP): NOT DETECTED
POC Cocaine UR: NOT DETECTED
POC Marijuana UR: NOT DETECTED
POC Methadone UR: NOT DETECTED
POC Methamphetamine UR: NOT DETECTED
POC Morphine: NOT DETECTED
POC Oxazepam (BZO): NOT DETECTED
POC Oxycodone UR: NOT DETECTED
POC Secobarbital (BAR): NOT DETECTED

## 2020-12-28 LAB — IRON AND TIBC
Iron: 12 ug/dL — ABNORMAL LOW (ref 28–170)
Saturation Ratios: 3 % — ABNORMAL LOW (ref 10.4–31.8)
TIBC: 463 ug/dL — ABNORMAL HIGH (ref 250–450)
UIBC: 451 ug/dL

## 2020-12-28 LAB — CBC WITH DIFFERENTIAL/PLATELET
Abs Immature Granulocytes: 0.03 10*3/uL (ref 0.00–0.07)
Basophils Absolute: 0.1 10*3/uL (ref 0.0–0.1)
Basophils Relative: 1 %
Eosinophils Absolute: 0 10*3/uL (ref 0.0–0.5)
Eosinophils Relative: 0 %
HCT: 26.2 % — ABNORMAL LOW (ref 36.0–46.0)
Hemoglobin: 6.9 g/dL — CL (ref 12.0–15.0)
Immature Granulocytes: 0 %
Lymphocytes Relative: 21 %
Lymphs Abs: 1.6 10*3/uL (ref 0.7–4.0)
MCH: 16.4 pg — ABNORMAL LOW (ref 26.0–34.0)
MCHC: 26.3 g/dL — ABNORMAL LOW (ref 30.0–36.0)
MCV: 62.2 fL — ABNORMAL LOW (ref 80.0–100.0)
Monocytes Absolute: 0.8 10*3/uL (ref 0.1–1.0)
Monocytes Relative: 11 %
Neutro Abs: 4.9 10*3/uL (ref 1.7–7.7)
Neutrophils Relative %: 67 %
Platelets: 460 10*3/uL — ABNORMAL HIGH (ref 150–400)
RBC: 4.21 MIL/uL (ref 3.87–5.11)
RDW: 19.8 % — ABNORMAL HIGH (ref 11.5–15.5)
WBC: 7.3 10*3/uL (ref 4.0–10.5)
nRBC: 0 % (ref 0.0–0.2)

## 2020-12-28 LAB — HIV ANTIBODY (ROUTINE TESTING W REFLEX): HIV Screen 4th Generation wRfx: NONREACTIVE

## 2020-12-28 LAB — HEMOGLOBIN AND HEMATOCRIT, BLOOD
HCT: 25.8 % — ABNORMAL LOW (ref 36.0–46.0)
Hemoglobin: 6.8 g/dL — CL (ref 12.0–15.0)

## 2020-12-28 LAB — FERRITIN: Ferritin: 3 ng/mL — ABNORMAL LOW (ref 11–307)

## 2020-12-28 LAB — SAVE SMEAR(SSMR), FOR PROVIDER SLIDE REVIEW

## 2020-12-28 LAB — ETHANOL: Alcohol, Ethyl (B): <10 mg/dL (ref <10)

## 2020-12-28 LAB — POC SARS CORONAVIRUS 2 AG -  ED: SARS Coronavirus 2 Ag: NEGATIVE

## 2020-12-28 LAB — POCT PREGNANCY, URINE: Preg Test, Ur: NEGATIVE

## 2020-12-28 LAB — TSH: TSH: 3.713 u[IU]/mL (ref 0.350–4.500)

## 2020-12-28 LAB — POC SARS CORONAVIRUS 2 AG: SARSCOV2ONAVIRUS 2 AG: NEGATIVE

## 2020-12-28 LAB — PREGNANCY, URINE: Preg Test, Ur: NEGATIVE

## 2020-12-28 LAB — RESP PANEL BY RT-PCR (FLU A&B, COVID) ARPGX2
Influenza A by PCR: NEGATIVE
Influenza B by PCR: NEGATIVE
SARS Coronavirus 2 by RT PCR: NEGATIVE

## 2020-12-28 MED ORDER — OLANZAPINE 5 MG PO TBDP: 2.5000 mg | ORAL_TABLET | Freq: Every day | ORAL | 0 refills | Status: DC

## 2020-12-28 MED ORDER — FLUOXETINE HCL 10 MG PO CAPS: 10.0000 mg | ORAL_CAPSULE | Freq: Every day | ORAL | 0 refills | Status: DC

## 2020-12-28 MED ORDER — POLYETHYLENE GLYCOL 3350 17 G PO PACK: 17.0000 g | PACK | Freq: Every day | ORAL | Status: DC | PRN

## 2020-12-28 MED ORDER — SODIUM CHLORIDE 0.9% IV SOLUTION: Freq: Once | INTRAVENOUS | Status: AC

## 2020-12-28 MED ORDER — OLANZAPINE 2.5 MG PO TABS
ORAL_TABLET | ORAL | Status: AC
  Administered 2020-12-28: 2.5 mg
  Filled 2020-12-28: qty 1

## 2020-12-28 MED ORDER — POTASSIUM CHLORIDE CRYS ER 20 MEQ PO TBCR: 40.0000 meq | EXTENDED_RELEASE_TABLET | Freq: Once | ORAL | Status: DC

## 2020-12-28 MED ORDER — PANTOPRAZOLE SODIUM 20 MG PO TBEC: 20.0000 mg | DELAYED_RELEASE_TABLET | Freq: Every day | ORAL | Status: DC

## 2020-12-28 MED ORDER — POLYSACCHARIDE IRON COMPLEX 150 MG PO CAPS
150.0000 mg | ORAL_CAPSULE | Freq: Every day | ORAL | Status: DC
  Administered 2020-12-29: 150 mg via ORAL
  Filled 2020-12-28 (×3): qty 1

## 2020-12-28 MED ORDER — SODIUM CHLORIDE 0.9% FLUSH
10.0000 mL | INTRAVENOUS | Status: DC | PRN
Start: 2020-12-28 — End: 2020-12-29

## 2020-12-28 MED ORDER — FLUOXETINE HCL 10 MG PO CAPS
10.0000 mg | ORAL_CAPSULE | Freq: Every day | ORAL | Status: DC
  Administered 2020-12-29: 10 mg via ORAL
  Filled 2020-12-28 (×3): qty 1

## 2020-12-28 MED ORDER — ALBUTEROL SULFATE HFA 108 (90 BASE) MCG/ACT IN AERS: 1.0000 | INHALATION_SPRAY | Freq: Four times a day (QID) | RESPIRATORY_TRACT | Status: DC | PRN

## 2020-12-28 MED ORDER — CHLORHEXIDINE GLUCONATE CLOTH 2 % EX PADS
6.0000 | MEDICATED_PAD | Freq: Every day | CUTANEOUS | Status: DC
  Administered 2020-12-29: 6 via TOPICAL

## 2020-12-28 MED ORDER — ACETAMINOPHEN 325 MG PO TABS: 650.0000 mg | ORAL_TABLET | Freq: Four times a day (QID) | ORAL | Status: DC | PRN

## 2020-12-28 MED ORDER — FLUOXETINE HCL 10 MG PO CAPS: 10.0000 mg | ORAL_CAPSULE | Freq: Every day | ORAL | Status: DC

## 2020-12-28 MED ORDER — MAGNESIUM HYDROXIDE 400 MG/5ML PO SUSP: 30.0000 mL | Freq: Every day | ORAL | Status: DC | PRN

## 2020-12-28 MED ORDER — SODIUM CHLORIDE 0.9% FLUSH
10.0000 mL | Freq: Two times a day (BID) | INTRAVENOUS | Status: DC
  Administered 2020-12-28 – 2020-12-29 (×2): 10 mL

## 2020-12-28 MED ORDER — OLANZAPINE 5 MG PO TBDP: 2.5000 mg | ORAL_TABLET | Freq: Every day | ORAL | Status: DC

## 2020-12-28 MED ORDER — HYDROXYZINE HCL 25 MG PO TABS
25.0000 mg | ORAL_TABLET | Freq: Three times a day (TID) | ORAL | Status: DC | PRN
  Administered 2020-12-28: 25 mg via ORAL
  Filled 2020-12-28: qty 1

## 2020-12-28 MED ORDER — ALUM & MAG HYDROXIDE-SIMETH 200-200-20 MG/5ML PO SUSP: 30.0000 mL | ORAL | Status: DC | PRN

## 2020-12-28 MED ORDER — ACETAMINOPHEN 325 MG PO TABS
650.0000 mg | ORAL_TABLET | Freq: Four times a day (QID) | ORAL | Status: DC | PRN
  Administered 2020-12-28: 650 mg via ORAL
  Filled 2020-12-28: qty 2

## 2020-12-28 MED ORDER — OLANZAPINE 5 MG PO TBDP
2.5000 mg | ORAL_TABLET | Freq: Every day | ORAL | Status: DC
  Administered 2020-12-28: 2.5 mg via ORAL
  Filled 2020-12-28 (×2): qty 0.5

## 2020-12-28 NOTE — Progress Notes (Signed)
Sandra Macdonald is a 25 y.o. female patient admitted from ED. Awake, alert - oriented  X 4 - no acute distress noted.  VSS - Blood pressure 110/73, pulse 78, temperature 98.3 F (36.8 C), temperature source Oral, resp. rate 18, height 5\' 5"  (1.651 m), SpO2 100 %.    IV in place, occlusive dsg intact without redness.   Pt IVC, NT/Sitter at bedside for safety. Pt c/o headache 7/10 pounding like. Made MD on call aware.    Will cont to eval and treat per MD orders.  Vidal Schwalbe, RN 12/28/2020 2100

## 2020-12-28 NOTE — ED Provider Notes (Signed)
Behavioral Health Admission H&P [facility] & OBS)  Date: 12/28/20 Patient Name: Sandra Macdonald MRN: [id number] Chief Complaint: No chief complaint on file.     Diagnoses:  Final diagnoses:  Brief psychotic disorder ([place])    HPI: Sandra Macdonald is a 25 y.o. female who presents voluntarily to the [facility] via law-enforcement. The patient states that she told her mother to call the police tonight because sometimes she feels "like I am not here. I am dead and this is not real life." She states that his is a new onset.   She endorses suicidal thoughts and states that she is always suicidal. She states that she don't actually do anything, she just have the thoughts. She denies homicidal ideations. When asked if she's experiencing auditory and visual hallucinations, she states "I don't know."  She does not appear to be responding to internal, or external stimuli. She denies drinking alcohol, or using illicit drugs, including THC.   On approach, she is alert, oriented x 4 and vaguely answers questions. Her mood is depressed, and her affect is congruent with her mood. During the assessment, she held her head downward towards the table and was tearful. Her thought process is logical with dissociative thought content. Speech is coherent with a decreased tone and rate.   Patient gave verbal consent for this provider and TTS counselor to speak with her mother Kristelle Cavallaro [phone]: Seth Bake states that the patient told her that she could hear her crying out and saw herself stabbing people and hurting them. She states that the patient stated that she did not want to hurt someone, or herself and then wake up and realize what she did. She states that the patient has been off her medications for months and really needs to get back on her medications. She was unable to recall the patient's past medication regimen.  Patient states that she has a psychiatric hx of psychosis. She states she doesn't want to take medications because  she's had bad experiences with taking medications and described the side effects as "lock jaw, drooling and shakes."   Per chart review: Patient was last inpatient on 12/02/19. Last observation assessment at [facility] on 5/5/2. Patient past psychotropic regimen: Risperdal 1 mg PO in am, Risperdal 3 mg PO at HS, Trazodone 100 mg PO at bedtime, and Prozac 20 mg PO daily.  I discussed overnight observation and medication management with the patient. She agreed to overnight observation. She agreed to resuming at Prozac 10 mg PO starting today. She agreed to starting Zyprexa zydis 2.5 mg PO at HS for psychosis. Risk and benefits of medications discussed with the patient.   PHQ 2-9:  [employer] from 04/10/2018 in [place] for [place] from 02/18/2018 in [facility] for [facility]  Thoughts that you would be better off dead, or of hurting yourself in some way Not at all Not at all  PHQ-9 Total Score 17 10      Flowsheet Row ED from 09/21/2020 in [facility] DEPT Admission (Discharged) from OP Visit from 12/10/2019 in [facility] Admission (Discharged) from 12/02/2019 in [facility]  C-SSRS RISK CATEGORY No Risk No Risk No Risk       Total Time spent with patient: 20 minutes  Musculoskeletal  Strength & Muscle Tone: within normal limits Gait & Station: normal Patient leans: N/A  Psychiatric Specialty Exam  Presentation General Appearance: Appropriate for Environment  Eye  Contact:Minimal  Speech:Clear and Coherent  Speech Volume:Decreased  Handedness:Right   Mood and Affect  Mood:Depressed  Affect:Depressed; Congruent   Thought Process  Thought Processes:Coherent  Descriptions of Associations:Intact  Orientation:Full (Time, Place and Person)  Thought Content:Logical    Hallucinations:Hallucinations: None ("I don't  know.")  Ideas of Reference:None ("I don't know.")  Suicidal Thoughts:Suicidal Thoughts: Yes, Passive ("I am always suicidal.") SI Passive Intent and/or Plan: Without Plan; Without Intent  Homicidal Thoughts:Homicidal Thoughts: No   Sensorium  Memory:Immediate Fair; Recent Fair; Remote Fair  Judgment:Intact  Insight:Present   Executive Functions  Concentration:Fair  Attention Span:Fair  [place]   Psychomotor Activity  Psychomotor Activity:Psychomotor Activity: Normal   Assets  Assets:Communication Skills; Desire for Improvement; Financial Resources/Insurance; Housing; Leisure Time; Physical Health; Social Support   Sleep  Sleep:Sleep: Fair Number of Hours of Sleep: 6   No data recorded  Physical Exam Constitutional:      Appearance: Normal appearance.  HENT:     Head: Normocephalic and atraumatic.     Nose: Nose normal.  Eyes:     Conjunctiva/sclera: Conjunctivae normal.  Cardiovascular:     Rate and Rhythm: Tachycardia present.  Pulmonary:     Effort: Pulmonary effort is normal.  Musculoskeletal:        General: Normal range of motion.     Cervical back: Normal range of motion.  Neurological:     General: No focal deficit present.     Mental Status: She is alert and oriented to person, place, and time.    Review of Systems  Constitutional: Negative.   HENT: Negative.   Eyes: Negative.   Respiratory: Negative.   Cardiovascular: Negative.   Skin: Negative.   Neurological: Negative.   Endo/Heme/Allergies: Negative.   Psychiatric/Behavioral: Positive for depression and suicidal ideas. Negative for substance abuse. The patient does not have insomnia.     Blood pressure 138/83, pulse (!) 116, temperature 98.8 F (37.1 C), temperature source Oral, resp. rate 18, SpO2 100 %. There is no height or weight on file to calculate BMI.  Past Psychiatric History: Substance Induced-Psychosis. MDD. GAD  Is the  patient at risk to self? Yes  Has the patient been a risk to self in the past 6 months? Unknown.    Has the patient been a risk to self within the distant past? Yes   Is the patient a risk to others? No   Has the patient been a risk to others in the past 6 months? Unknown  Has the patient been a risk to others within the distant past? Unknown  Past Medical History:  Past Medical History:  Diagnosis Date  . Depression   . Fibroid   . G6PD deficiency   . Supervision of normal first pregnancy, antepartum 02/18/2018    Nursing Staff Provider Office Location  [facility] Dating  LMP/6 week [place]  Language   English  Anatomy US   Flu Vaccine  n/a Genetic Screen  NIPS: low risk, female  AFP:   TDaP vaccine    Hgb A1C or  GTT Early  Third trimester  Rhogam     LAB RESULTS  Feeding Plan Breast Blood Type A/Positive/-- (07/15 0942)  Contraception  Antibody Negative (07/15 0942) Circumcision  Rubella 3.00 (07/15 6720) Pediatricia    Past Surgical History:  Procedure Laterality Date  . NO PAST SURGERIES      Family History:  Family History  Problem Relation Age of Onset  . Mental illness Mother   .  ADD / ADHD Neg Hx   . Anxiety disorder Neg Hx   . Alcohol abuse Neg Hx   . Arthritis Neg Hx   . Asthma Neg Hx   . Cancer Neg Hx   . Birth defects Neg Hx   . Depression Neg Hx   . COPD Neg Hx   . Diabetes Neg Hx   . Drug abuse Neg Hx   . Early death Neg Hx   . Hearing loss Neg Hx   . Heart disease Neg Hx   . Hyperlipidemia Neg Hx   . Hypertension Neg Hx   . Intellectual disability Neg Hx   . Kidney disease Neg Hx   . Learning disabilities Neg Hx   . Miscarriages / Stillbirths Neg Hx   . Stroke Neg Hx   . Obesity Neg Hx   . Vision loss Neg Hx   . Varicose Veins Neg Hx     Social History:  Social History   Socioeconomic History  . Marital status: Single    Spouse name: Not on file  . Number of children: Not on file  . Years of education: Not on file  . Highest education level: 10th grade   Occupational History  . Occupation: Disabled  Tobacco Use  . Smoking status: Never Smoker  . Smokeless tobacco: Never Used  Vaping Use  . Vaping Use: Never used  Substance and Sexual Activity  . Alcohol use: Not Currently    Alcohol/week: 1.0 standard drink    Types: 1 Standard drinks or equivalent per week    Comment: per pt drinks socially 1-2x month on average  . Drug use: Not Currently    Frequency: 14.0 times per week    Types: Marijuana    Comment: pt reports 1-2 blunts on work days, more on days off  . Sexual activity: Not Currently    Birth control/protection: None  Other Topics Concern  . Not on file  Social History Narrative   Pt lives with mother in [place].  She has an upcoming appointment with [place] (on June 2).     Social Determinants of Health   Financial Resource Strain: Not on file  Food Insecurity: Not on file  Transportation Needs: Not on file  Physical Activity: Not on file  Stress: Not on file  Social Connections: Not on file  Intimate Partner Violence: Not on file    SDOH:  SDOH Screenings   Alcohol Screen: Not on file  Depression (QQI2-9): Not on file  Financial Resource Strain: Not on file  Food Insecurity: Not on file  Housing: Not on file  Physical Activity: Not on file  Social Connections: Not on file  Stress: Not on file  Tobacco Use: Low Risk   . Smoking Tobacco Use: Never Smoker  . Smokeless Tobacco Use: Never Used  Transportation Needs: Not on file    Last Labs:  Admission on 12/28/2020  Component Date Value Ref Range Status  . SARS Coronavirus 2 Ag 12/28/2020 Negative  Negative Preliminary  . SARSCOV2ONAVIRUS 2 AG 12/28/2020 NEGATIVE  NEGATIVE Final   Comment: (NOTE) SARS-CoV-2 antigen NOT DETECTED.   Negative results are presumptive.  Negative results do not preclude SARS-CoV-2 infection and should not be used as the sole basis for treatment or other patient management decisions, including  infection  control decisions, particularly in the presence of clinical signs and  symptoms consistent with COVID-19, or in those who have been in contact with the virus.  Negative results must be combined with clinical observations, patient history, and epidemiological information. The expected result is Negative.  Fact Sheet for Patients: HandmadeRecipes.com.cy  Fact Sheet for Healthcare Providers: FuneralLife.at  This test is not yet approved or cleared by the [place] FDA and  has been authorized for detection and/or diagnosis of SARS-CoV-2 by FDA under an Emergency Use Authorization (EUA).  This EUA will remain in effect (meaning this test can be used) for the duration of  the COV                          ID-19 declaration under Section 564(b)(1) of the Act, 21 U.S.C. section 360bbb-3(b)(1), unless the authorization is terminated or revoked sooner.      Allergies: Blue dyes (parenteral), Codeine, and Other  PTA Medications: (Not in a hospital admission)   Medical Decision Making  Patient admitted to overnight observation for safety, mood stabilization and medication management.  Started Zyprexa zydis 2.5 mg PO daily at HS for psychosis. Started Prozac 10 mg PO daily for depression. Labs ordered: Covid, CMP, CBC, Lipid, TSH, A1C, urine pregnancy, ETOH, EKG    Recommendations  Based on my evaluation the patient does not appear to have an emergency medical condition.  Christohper Dube L, NP 12/28/20  4:12 AM

## 2020-12-28 NOTE — Progress Notes (Signed)
Pt transferred to [facility] per MD order due to critical hgb value of 6.9. Pt is alert and oriented. No signs of acute distress noted. Report given to [place], CN. EMS transported pt via stretcher. Pt given her belongings.

## 2020-12-28 NOTE — ED Provider Notes (Addendum)
FBC/OBS ASAP Discharge Summary  Date and Time: 12/28/2020 8:12 AM  Name: Sandra Macdonald  MRN:  [id number]   Discharge Diagnoses:  Final diagnoses:  Brief psychotic disorder [facility])    Subjective: Sandra Macdonald is a 25 yo patient w/ PMH of G6PD deficiency, Cannabis use disorder, depression w/ psychosis. Patient presented to [facility] voluntarily via G[facility] reporting feeling like " I am not here. I am dead and this is not real life." Patient endorses SI and HI w/o intent or plan as well.   Stay Summary:  Patient was discharged from [facility] 12/01/2020 with prescriptions for  Depression with psychosis ( Risperidone 1mg  daily, Risperidone 3mg  QHS, and Prozac 20mg ). On admission for observation yesterday patient' s mother was called and reported that she was not taking her medications for months. Patient UDS was negative; however patient's CBC noted Hgb of 6.9. Patient was started on Zyprexa 2.5mg  QHS and Prozac 10mg . On reassessment patient reported that she was not having SI but continued to endorse HI w/o intent or plan. Patient also denied AVH. Patient did not have any behavior issues on the units.   Total Time spent with patient: 15 minutes  Past Psychiatric History: Depression w/ psychosis Past Medical History:  Past Medical History:  Diagnosis Date  . Depression   . Fibroid   . G6PD deficiency   . Supervision of normal first pregnancy, antepartum 02/18/2018    Nursing Staff Provider Office Location  [facility] Dating  LMP/6 week [place]  Language   English  Anatomy US   Flu Vaccine  n/a Genetic Screen  NIPS: low risk, female  AFP:   TDaP vaccine    Hgb A1C or  GTT Early  Third trimester  Rhogam     LAB RESULTS  Feeding Plan Breast Blood Type A/Positive/-- (07/15 0942)  Contraception  Antibody Negative (07/15 0942) Circumcision  Rubella 3.00 (07/15 2637) Pediatricia    Past Surgical History:  Procedure Laterality Date  . NO PAST SURGERIES     Family History:  Family History  Problem Relation Age of Onset  . Mental  illness Mother   . ADD / ADHD Neg Hx   . Anxiety disorder Neg Hx   . Alcohol abuse Neg Hx   . Arthritis Neg Hx   . Asthma Neg Hx   . Cancer Neg Hx   . Birth defects Neg Hx   . Depression Neg Hx   . COPD Neg Hx   . Diabetes Neg Hx   . Drug abuse Neg Hx   . Early death Neg Hx   . Hearing loss Neg Hx   . Heart disease Neg Hx   . Hyperlipidemia Neg Hx   . Hypertension Neg Hx   . Intellectual disability Neg Hx   . Kidney disease Neg Hx   . Learning disabilities Neg Hx   . Miscarriages / Stillbirths Neg Hx   . Stroke Neg Hx   . Obesity Neg Hx   . Vision loss Neg Hx   . Varicose Veins Neg Hx    Family Psychiatric History: Unknown Social History:  Social History   Substance and Sexual Activity  Alcohol Use Not Currently  . Alcohol/week: 1.0 standard drink  . Types: 1 Standard drinks or equivalent per week   Comment: per pt drinks socially 1-2x month on average     Social History   Substance and Sexual Activity  Drug Use Not Currently  . Frequency: 14.0 times per week  . Types: Marijuana  Comment: pt reports 1-2 blunts on work days, more on days off    Social History   Socioeconomic History  . Marital status: Single    Spouse name: Not on file  . Number of children: Not on file  . Years of education: Not on file  . Highest education level: 10th grade  Occupational History  . Occupation: Disabled  Tobacco Use  . Smoking status: Never Smoker  . Smokeless tobacco: Never Used  Vaping Use  . Vaping Use: Never used  Substance and Sexual Activity  . Alcohol use: Not Currently    Alcohol/week: 1.0 standard drink    Types: 1 Standard drinks or equivalent per week    Comment: per pt drinks socially 1-2x month on average  . Drug use: Not Currently    Frequency: 14.0 times per week    Types: Marijuana    Comment: pt reports 1-2 blunts on work days, more on days off  . Sexual activity: Not Currently    Birth control/protection: None  Other Topics Concern  . Not on  file  Social History Narrative   Pt lives with mother in [place].  She has an upcoming appointment with Sandra Macdonald (on June 2).     Social Determinants of Health   Financial Resource Strain: Not on file  Food Insecurity: Not on file  Transportation Needs: Not on file  Physical Activity: Not on file  Stress: Not on file  Social Connections: Not on file   SDOH:  SDOH Screenings   Alcohol Screen: Not on file  Depression (PHQ2-9): Medium Risk  . PHQ-2 Score: 15  Financial Resource Strain: Not on file  Food Insecurity: Not on file  Housing: Not on file  Physical Activity: Not on file  Social Connections: Not on file  Stress: Not on file  Tobacco Use: Low Risk   . Smoking Tobacco Use: Never Smoker  . Smokeless Tobacco Use: Never Used  Transportation Needs: Not on file    Has this patient used any form of tobacco in the last 30 days? (Cigarettes, Smokeless Tobacco, Cigars, and/or Pipes) Prescription not provided because: patient is being transferred to ED  Current Medications:  Current Facility-Administered Medications  Medication Dose Route Frequency Provider Last Rate Last Admin  . acetaminophen (TYLENOL) tablet 650 mg  650 mg Oral Q6H PRN White, Sandra L, NP      . alum & mag hydroxide-simeth (MAALOX/MYLANTA) 200-200-20 MG/5ML suspension 30 mL  30 mL Oral Q4H PRN White, Sandra L, NP      . FLUoxetine (PROZAC) capsule 10 mg  10 mg Oral Daily White, Sandra L, NP      . hydrOXYzine (ATARAX/VISTARIL) tablet 25 mg  25 mg Oral TID PRN White, Sandra L, NP   25 mg at 12/28/20 0449  . magnesium hydroxide (MILK OF MAGNESIA) suspension 30 mL  30 mL Oral Daily PRN White, Sandra L, NP      . OLANZapine zydis (ZYPREXA) disintegrating tablet 2.5 mg  2.5 mg Oral QHS White, Sandra L, NP       Current Outpatient Medications  Medication Sig Dispense Refill  . albuterol (VENTOLIN HFA) 108 (90 Base) MCG/ACT inhaler Inhale 1 puff into the lungs every 6 (six) hours as  needed for wheezing or shortness of breath. 18 g 0  . benztropine (COGENTIN) 1 MG tablet Take 1 tablet (1 mg total) by mouth 2 (two) times daily. 60 tablet 0  . ferrous sulfate 325 (65 FE) MG tablet  Take 1 tablet (325 mg total) by mouth daily with breakfast. 90 tablet 3  . FLUoxetine (PROZAC) 10 MG capsule Take 1 capsule (10 mg total) by mouth daily. 30 capsule 0  . [START ON 12/29/2020] OLANZapine zydis (ZYPREXA) 5 MG disintegrating tablet Take 0.5 tablets (2.5 mg total) by mouth at bedtime. 30 tablet 0  . pantoprazole (PROTONIX) 20 MG tablet Take 1 tablet (20 mg total) by mouth daily. 30 tablet 0  . triamcinolone (KENALOG) 0.1 % Apply 1 application topically 2 (two) times daily. 30 g 0    PTA Medications: (Not in a hospital admission)   Musculoskeletal  Strength & Muscle Tone: within normal limits Gait & Station: normal Patient leans: N/A  Psychiatric Specialty Exam  Presentation  General Appearance: Casual  Eye Contact:Minimal  Speech:Clear and Coherent  Speech Volume:Decreased  Handedness:Right   Mood and Affect  Mood:Euthymic  Affect:Congruent   Thought Process  Thought Processes:Irrevelant (Can't really explain why she is having thoughts)  Descriptions of Associations:Intact  Orientation:Partial  Thought Content:Illogical  Diagnosis of Schizophrenia or Schizoaffective disorder in past: No  Duration of Psychotic Symptoms: Less than six months   Hallucinations:Hallucinations: None  Ideas of Reference:None  Suicidal Thoughts:Suicidal Thoughts: No SI Passive Intent and/or Plan: Without Plan; Without Intent  Homicidal Thoughts:Homicidal Thoughts: Yes, Active HI Active Intent and/or Plan: Without Intent; Without Plan   Sensorium  Memory:Immediate Good; Recent Fair; Remote Fair  Judgment:Impaired  Insight:None   Executive Functions  Concentration:Fair  Attention Span:Fair  [place]   Psychomotor  Activity  Psychomotor Activity:Psychomotor Activity: Normal   Assets  Assets:Resilience   Sleep  Sleep:Sleep: Fair Number of Hours of Sleep: 6   No data recorded  Physical Exam  Physical Exam Constitutional:      Appearance: Normal appearance.  HENT:     Head: Normocephalic and atraumatic.     Nose: Nose normal.  Eyes:     Extraocular Movements: Extraocular movements intact.     Pupils: Pupils are equal, round, and reactive to light.  Cardiovascular:     Rate and Rhythm: Normal rate.     Pulses: Normal pulses.  Pulmonary:     Effort: Pulmonary effort is normal.  Musculoskeletal:        General: Normal range of motion.  Skin:    General: Skin is warm and dry.  Neurological:     General: No focal deficit present.     Mental Status: She is alert.    Review of Systems  Constitutional: Negative for chills and fever.  HENT: Negative for hearing loss.   Eyes: Negative for blurred vision.  Respiratory: Negative for cough and wheezing.   Cardiovascular: Negative for chest pain.  Gastrointestinal: Negative for abdominal pain.  Neurological: Negative for dizziness.  Psychiatric/Behavioral: Negative for suicidal ideas.   Blood pressure 120/64, pulse 76, temperature 98.8 F (37.1 C), resp. rate 18, SpO2 100 %. There is no height or weight on file to calculate BMI.  Demographic Factors:  Adolescent or young adult  Loss Factors: NA  Historical Factors: NA  Risk Reduction Factors:   NA  Continued Clinical Symptoms:  Depression:   Anhedonia HI w/o intent or plan Cognitive Features That Contribute To Risk:  None    Suicide Risk:  Mild:  Suicidal ideation of limited frequency, intensity, duration, and specificity.  There are no identifiable plans, no associated intent, mild dysphoria and related symptoms, good self-control (both objective and subjective assessment), few other risk factors, and  identifiable protective factors, including available and accessible  social support.  Plan Of Care/Follow-up recommendations:  Follow up recommendations: - Activity as tolerated. - Diet as recommended by PCP. - Keep all scheduled follow-up appointments as recommended.   Disposition:  Patient needs to be transferred to [facility] due to Hgb of 6.9 w/ unknown cause. Current mental symptoms could be 2/2 to medical etiology.  PGY-1  Freida Busman, MD 12/28/2020, 8:12 AM

## 2020-12-28 NOTE — ED Notes (Signed)
USGIV at bedside

## 2020-12-28 NOTE — H&P (Signed)
Date: 12/28/2020               Patient Name:  Sandra Macdonald MRN: [id number]  DOB: 1996-02-20 Age / Sex: 25 y.o., female   PCP: Patient, No Pcp Per (Inactive)         Medical Service: Internal Medicine Teaching Service         Attending Physician: Dr. Jimmye Norman    First Contact: Dr. 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  She presented to the behavioral health center with auditory hallucination when she heard voices to hurt people and also to jump out of the car, suicidal ideation and homicidal ideation without any plan.  During evaluation, CBC was noted to show hemoglobin of 6.9.  She was then sent to [facility] for further management and started on Zyprexa 2.5 mg nightly and Prozac 10 mg daily.  Her most recent admission to the behavioral health center was December 01, 2020 when she was admitted with depression with psychosis and discharged on risperidone 1 mg daily, risperidone 3 mg nightly and Prozac 20 mg daily.  She states that for the past 1 to 2 weeks she has been experiencing occipital headache with radiation to her forehead.  Describes the pain as throbbing and sharp.  The pain usually last for 5 to 10 minutes.  Rates the pain at 7/10.  Denies any association of headache to light or sound but does endorse nausea and lightheadedness but denies vomiting.  Her last menstrual period was about a couple weeks ago and during  her periods, she changes pads every hour.  Her period lasts for 6 to 7 days.  She denies hematochezia, bright red blood per rectum, shortness of breath, burning epigastric sensation.  She does state that sometimes she would experience intermittent chest pain/pressure which has been going on for "years."  Does not endorse syncope, presyncope.  The pain usually last for 5 to 10 minutes and eases off.  Currently, she has no chest pain whatsoever.  She states that the last time this happened was on route to the emergency department.   Lab Orders     Iron and TIBC     Ferritin     Haptoglobin     Lactate dehydrogenase     Pathologist smear review     Save Smear     HIV Antibody (routine testing w rflx)     CBC     Basic metabolic panel   Meds:  Current Meds  Medication Sig  . albuterol (VENTOLIN HFA) 108 (90 Base) MCG/ACT inhaler Inhale 1 puff into the lungs every 6 (six) hours as needed for wheezing or shortness of breath.  . benztropine (COGENTIN) 1 MG tablet Take 1 tablet (1 mg total) by mouth 2 (two) times daily.  . ferrous sulfate 325 (65 FE) MG tablet Take 1 tablet (325 mg total) by mouth daily with breakfast.  . FLUoxetine (PROZAC) 10 MG capsule Take 1 capsule (10 mg  total) by mouth daily.  Derrill Memo ON 12/29/2020] OLANZapine zydis (ZYPREXA) 5 MG disintegrating tablet Take 0.5 tablets (2.5 mg total) by mouth at bedtime.  . pantoprazole (PROTONIX) 20 MG tablet Take 1 tablet (20 mg total) by mouth daily.  Marland Kitchen triamcinolone (KENALOG) 0.1 % Apply 1 application topically 2 (two) times daily.     Allergies: Allergies as of 12/28/2020 - Review Complete 12/28/2020  Allergen Reaction Noted  . Bactrim [sulfamethoxazole-trimethoprim]  12/28/2020  . Blue dyes (parenteral) Other (See Comments) 09/16/2017  . Codeine Other (See Comments) 09/16/2017  . Ibuprofen  12/28/2020  . Other Other (See Comments) 09/16/2017  . Penicillins  12/28/2020   Past Medical History:  Diagnosis Date  . Depression    . Fibroid   . G6PD deficiency   . Supervision of normal first pregnancy, antepartum 02/18/2018    Nursing Staff Provider Office Location  [facility] Dating  LMP/6 week [place]  Language   English  Anatomy US   Flu Vaccine  n/a Genetic Screen  NIPS: low risk, female  AFP:   TDaP vaccine    Hgb A1C or  GTT Early  Third trimester  Rhogam     LAB RESULTS  Feeding Plan Breast Blood Type A/Positive/-- (07/15 0942)  Contraception  Antibody Negative (07/15 9811) Circumcision  Rubella 3.00 (07/15 0942) Pediatricia    Family History: Denies family history of cardiac diseases  Social History: Lives at home with mother.  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  Has no children.  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  Denies alcohol use, cigarette use or illicit drug use  Review of Systems: A complete ROS was negative except as per HPI.   Physical Exam: Blood pressure 103/67, pulse 66, temperature 97.9 F (36.6 C), temperature source Oral, resp. rate 19, height 5\' 5"  (1.651 m), SpO2 100 %. Physical Exam Vitals and nursing note reviewed.  Constitutional:      General: She is not in acute distress.    Appearance: She is obese. She is not toxic-appearing.  HENT:     Head: Normocephalic and atraumatic.     Mouth/Throat:     Mouth: Mucous membranes are moist.  Eyes:     General: Lids are normal.     Comments: Pallor conjunctiva   Cardiovascular:     Rate and Rhythm: Normal rate.     Heart sounds: No murmur heard.   Pulmonary:     Effort: Pulmonary effort is normal.     Breath sounds: Normal breath sounds. No wheezing or rales.  Abdominal:     General: Bowel sounds are normal.     Tenderness: There is no abdominal tenderness.  Musculoskeletal:     Cervical back: Neck supple.     Right lower leg: No edema.     Left lower leg: No edema.  Skin:    General: Skin is warm.  Neurological:     Mental Status: She is alert.  Psychiatric:        Mood and Affect: Mood normal.        Behavior: Behavior  normal.    EKG: personally reviewed my interpretation is SR  Assessment & Plan by Problem: Principal Problem:   Iron deficiency anemia Active Problems:   Depression with psychosis    Ms. Desrosier is a 25 year old woman with medical history significant for iron deficiency anemia, menorrhagia, uterine fibroids, G6PD deficiency anemia, depression with psychosis, prior behavioral health admission for psychosis here for management of worsening iron deficiency  anemia  #Iron deficiency anemia #Menorrhagia #Uterine fibroids #Hx of G6PD deficiency anemia She has a known history of menorrhagia and iron deficiency anemia.  States that when she is on her menses, she can change pads every hour.  CBC shows hemoglobin 6.9, MCV 62, RDW 19.8.  Her ferritin level performed in July 2019 was 40.  Given her history of G6PD deficiency anemia, I will go ahead and order blood smear, haptoglobin and LDH to rule out underlying hemolysis.  It is reassuring that her CMP does not reveal elevated total bilirubin.  Used to be on iron supplements but self discontinued -Prepare and transfuse 1 unit PRBC -Follow-up posttransfusion hemoglobin and hematocrit -Place order for IV iron prior to discharge -Start Nu iron polysaccharide 150 mg daily -Needs to follow-up with OB/GYN after discharge for further management of menorrhagia/fibroids   #Hypokalemia -K+ of 3.6 - Oral repletion   #Depression with psychosis -IVC -Zyprexa 2.5 mg nightly -Prozac 10 mg daily -Psychiatry consulted   #Low risk ASCVD She reports of experiencing intermittent chest pain/pressure for "years.  "This usually last for 5 to 10 minutes and subsides.  No high risk cardiac features such as presyncope, syncope.  Even though she is on a second generation antipsychotic which could put her at risk for metabolic syndrome, she is at low risk for ASCVD.  LDL 69.  EKG unremarkable.   HLK:TGYBWLS VTE LHT:DSKA CODE STATUS:FULL  Prior to Admission  Living Arrangement: Home Anticipated Discharge Location: Pending  Barriers to Discharge: Tx of anemia  Dispo: Admit patient to Observation with expected length of stay less than 2 midnights.  Signed: Jean Rosenthal, MD 12/28/2020, 11:27 AM  Pager: [phone] Internal Medicine Teaching Service After 5pm on weekdays and 1pm on weekends: On Call pager: [phone]

## 2020-12-28 NOTE — Discharge Instructions (Signed)
Patient is instructed to take all prescribed medications as recommended. Report any side effects or adverse reactions to your outpatient psychiatrist. Patient is instructed to abstain from alcohol and illegal drugs while on prescription medications. In the event of worsening symptoms, patient is instructed to call the crisis hotline, 911, or go to the nearest emergency department for evaluation and treatment.   

## 2020-12-28 NOTE — ED Notes (Signed)
Pt belongings inventoried and placed in locker #14. Pt valuables locked up in security. Pt wanded by security and dressed out into scrubs.

## 2020-12-28 NOTE — ED Triage Notes (Signed)
BIB EMS from [facility] for low hemoglobin. Pt presented there voluntarily for psychosis; visual hallucinations.

## 2020-12-28 NOTE — ED Notes (Signed)
USGIV at bedside (to try to place midline).

## 2020-12-28 NOTE — Progress Notes (Addendum)
RN received call from lab with critical Hgb value of 6.9 for patient. Joyce Gross, MD notified in person. Will continue to monitor pt.

## 2020-12-28 NOTE — ED Notes (Signed)
Lunch tray ordered 

## 2020-12-28 NOTE — ED Notes (Signed)
Attempted to call report. RN busy. Will call back in 10 min.
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
## 2020-12-28 NOTE — Discharge Summary (Signed)
Name: Sandra Macdonald MRN: [id number] DOB: 29-Oct-1995 25 y.o. PCP: Patient, No Pcp Per (Inactive)  Date of Admission: 12/28/2020  8:37 AM Date of Discharge:  12/28/2020 Attending Physician: Angelica Pou, MD   Discharge Diagnosis: 1. Iron deficiency anemia 2. Menorrhagia 3. Uterine fibroids 4. Hx of G6PD deficiency anemia 5. Depression with psychosis  Discharge Medications: Allergies as of 12/29/2020      Reactions   Bactrim [sulfamethoxazole-trimethoprim]    Trouble breathing, swelling   Blue Dyes (parenteral) Other (See Comments)   G6PD-- Decreased hemoglobin   Codeine Other (See Comments)   Decreased hemoglobin   Ibuprofen    Trouble breathing, swelling   Other Other (See Comments)   G6PD--Decreased hemoglobin to potato chips (not potatoes in other forms)   Penicillins    Trouble breathing, swelling      Medication List    STOP taking these medications   benztropine 1 MG tablet Commonly known as: COGENTIN   ferrous sulfate 325 (65 FE) MG tablet   OLANZapine zydis 5 MG disintegrating tablet Commonly known as: ZYPREXA Replaced by: OLANZapine 2.5 MG tablet   pantoprazole 20 MG tablet Commonly known as: PROTONIX     TAKE these medications   albuterol 108 (90 Base) MCG/ACT inhaler Commonly known as: VENTOLIN HFA Inhale 1 puff into the lungs every 6 (six) hours as needed for wheezing or shortness of breath.   FLUoxetine 10 MG capsule Commonly known as: PROZAC Take 1 capsule (10 mg total) by mouth daily.   iron polysaccharides 150 MG capsule Commonly known as: NIFEREX Take 1 capsule (150 mg total) by mouth daily.   OLANZapine 2.5 MG tablet Commonly known as: ZYPREXA Take 1 tablet (2.5 mg total) by mouth at bedtime. Replaces: OLANZapine zydis 5 MG disintegrating tablet   polyethylene glycol 17 g packet Commonly known as: MIRALAX / GLYCOLAX Take 17 g by mouth daily as needed for mild constipation.   triamcinolone cream 0.1 % Commonly known as:  KENALOG Apply 1 application topically 2 (two) times daily.       Disposition and follow-up:   [name] was discharged from [facility] in Stable condition.  At the hospital follow up visit please address:  1.  Follow up: Marland Kitchen Follow up with IM clinic in 1 week.  . Follow up Gynecologist in 1 week for menorrhagia.  . Follow up with psychiatry at [facility] as recommended by Psychiatry.  2.  Labs / imaging needed at time of follow-up: CBC  3.  Pending labs/ test needing follow-up: Peripheral smear, haptoglobin  Follow-up Appointments:  Follow-up Information    [place]. Schedule an appointment as soon as possible for a visit in 1 week(s).   Contact information: [address] [place] [address]. Schedule an appointment as soon as possible for a visit in 1 week(s).   Why: For PCP. Contact information: [place] [phone] [place] Hospital Course by problem list:  #Iron deficiency anemia #Menorrhagia #Uterine fibroids #Hx of G6PD deficiency anemia She has a known history of menorrhagia and iron deficiency anemia.  CBC shows hemoglobin 6.9, MCV 62, RDW 19.8. Given her history of G6PD deficiency anemia, blood smear, haptoglobin and LDH ordered to rule out underlying hemolysis. LDH normal. HIV neg,  Pathologist smear pending.  Iron studies showed  low iron of 12, UIBC 451, TIBC 463 and low ferritin of 3. Pt received 1 unit PRBC. Hb post transfusion 7.5. Pt was given 1 unit of Feraheme and recommended to start Nu iron polysaccharide 150 mg daily at discharge. Pt recommended to Follow up with OB/GYN after discharge for further management of menorrhagia/fibroids. Pt will follow up with IM clinic for PCP.  #Depression with psychosis Pt was seen at [facility] yesterday for depression symptoms with psychotic symptoms.  Psych medications continued during inpatient including Zyprexa 2.5 mg, Prozac 10 mg daily. Psych consulted prior to discharge and recommended to follow up on outpatient basis.  #Low risk ASCVD Pt reported intermittent chest pain. she is at low risk for ASCVD.  LDL 69.  EKG unremarkable.  Subjective on day of discharge: Pt is seen by medical team this morning. Pt states she is feeling good and not having any symptoms. She denies any SOB, bleeding, or pain. She denies any hallucinations, SI or HI. She doesn't have Obgyn or a PCP. We offered her to come here at IM clinic for Follow up. Discussed need for follow up with Gynecologist for fibroids and menorrhagia.  Pt agrees with plan.   Discharge Exam:   BP 116/65 (BP Location: Right Arm)   Pulse 80   Temp 98.2 F (36.8 C) (Oral)   Resp 16   Ht 5\' 5"  (1.651 m)   SpO2 100%   BMI 33.28 kg/m  Discharge exam: Physical Exam Constitutional: Pt lying on bed comfortably. Not in acute distress HEENT: Mild pallor present  Cardiovascular: regular rate and rhythm, normal heart sounds Pulmonary: effort normal, lungs clear to ascultation bilaterally Musculoskeletal: warm, Peripheral pulses equal b/l. Skin: warm and dry Neurological: alert, speech normal Psychiatric: normal mood and affect  Pertinent Labs, Studies, and Procedures:  No results found.  Discharge Instructions: Discharge Instructions    Call MD for:  difficulty breathing, headache or visual disturbances   Complete by: As directed    Call MD for:  extreme fatigue   Complete by: As directed    Call MD for:  severe uncontrolled pain   Complete by: As directed    Call MD for:  temperature >100.4   Complete by: As directed    Diet - low sodium heart healthy   Complete by: As directed    Increase activity slowly   Complete by: As directed      Dear Sandra Macdonald,   Thank you for letting us participate in your care! In this section, you will find a brief hospital admission summary of  why you were admitted to the hospital, what happened during your admission, your diagnosis/diagnoses, and recommended follow up.   You were admitted because you were experiencing low hemoglobin of 6.9   Your testing revealed Iron deficiency anemia.   You were diagnosed with Iron deficiency anemia  You were treated with Blood transfusion and IV iron and recommended for oral iron at discharge.Marland Kitchen   You were also seen by Psychiatry. They recommended to discharge Pt on medications and follow up on Outpatient basis.  Your condition improved and you were discharged from the hospital for meeting this goal.   POST-HOSPITAL & CARE INSTRUCTIONS . Follow up with IM Clinic in 1 week. Call to make appointment.  . Need Referal to Gynecologist for menorrhagia . Follow up with psychiatry in 1 week at [facility]. Walk in at [facility] between Mon to Thursday 8-11 am. Be there at 7.15 am to be seen by a provider.  Marland Kitchen  Please let PCP/Specialists know of any changes in medications that were made.  . Please see medications section of this packet for any medication changes.    DOCTOR'S APPOINTMENTS & FOLLOW UP No future appointments.   Thank you for choosing [facility]! Take care and be well!  Internal  Medicine Teaching Service Inpatient Team [facility]  [place], [place] [address] [phone]  Signed: Armando Reichert, MD 12/29/2020, 11:12 AM   Pager: [phone]

## 2020-12-28 NOTE — ED Notes (Signed)
This RN retrieved pt belongings from security and locker 14. Belongings given to staff on 6N.

## 2020-12-28 NOTE — ED Provider Notes (Signed)
[place] EMERGENCY DEPARTMENT Provider Note   CSN: [id number] Arrival date & time: 12/28/20  1914     History Chief Complaint  Patient presents with  . Abnormal Lab    Sandra Macdonald is a 25 y.o. female.  Patient was seen at behavioral health for auditory hallucinations commanding her to harm others.  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  As part of her screening she was found to be anemic.  When asked questions about source of anemia she does have a history of very heavy menstrual cycles.  6 to 7 days of menstrual bleeding with multiple pads per day, sometimes as severe as 1 pad per hour.  She denies any significant chest pain shortness of breath but does get intermittent lightheadedness.        Past Medical History:  Diagnosis Date  . Depression   . Fibroid   . G6PD deficiency   . Supervision of normal first pregnancy, antepartum 02/18/2018    Nursing Staff Provider Office Location  [facility] Dating  LMP/6 week [place]  Language   English  Anatomy US   Flu Vaccine  n/a Genetic Screen  NIPS: low risk, female  AFP:   TDaP vaccine    Hgb A1C or  GTT Early  Third trimester  Rhogam     LAB RESULTS  Feeding Plan Breast Blood Type A/Positive/-- (07/15 0942)  Contraception  Antibody Negative (07/15 0942) Circumcision  Rubella 3.00 (07/15 7829) Pediatricia    Patient Active Problem List   Diagnosis Date Noted  . Iron deficiency anemia 12/28/2020  . 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 Psychosis ([place]) 12/03/2019  . 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 Vaginal bleeding before [redacted] weeks gestation 03/20/2018  . SAB (spontaneous abortion) 03/20/2018  . 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 G6PD deficiency anemia ([place]) 01/14/2018  . Fibroids 12/30/2017    Past Surgical History:  Procedure Laterality  Date  . NO PAST SURGERIES       OB History    Gravida  2   Para      Term      Preterm      AB  2   Living        SAB  2   IAB      Ectopic      Multiple      Live Births              Family History  Problem Relation Age of Onset  . Mental illness Mother   . ADD / ADHD Neg Hx   . Anxiety disorder Neg Hx   . Alcohol abuse Neg Hx   . Arthritis Neg Hx   . Asthma Neg Hx   . Cancer Neg Hx   . Birth defects Neg Hx   . Depression Neg Hx   . COPD Neg Hx   . Diabetes Neg Hx   . Drug abuse Neg Hx   . Early death Neg Hx   . Hearing loss Neg Hx   . Heart disease Neg Hx   . Hyperlipidemia Neg Hx   . Hypertension Neg Hx   . Intellectual disability Neg Hx   . Kidney disease Neg Hx   . Learning disabilities Neg Hx   . Miscarriages / Stillbirths Neg Hx   . Stroke Neg Hx   .  Obesity Neg Hx   . Vision loss Neg Hx   . Varicose Veins Neg Hx     Social History   Tobacco Use  . Smoking status: Never Smoker  . Smokeless tobacco: Never Used  Vaping Use  . Vaping Use: Never used  Substance Use Topics  . Alcohol use: Not Currently    Alcohol/week: 1.0 standard drink    Types: 1 Standard drinks or equivalent per week    Comment: per pt drinks socially 1-2x month on average  . Drug use: Not Currently    Frequency: 14.0 times per week    Types: Marijuana    Comment: pt reports 1-2 blunts on work days, more on days off    Home Medications Prior to Admission medications   Medication Sig Start Date End Date Taking? Authorizing Provider  albuterol (VENTOLIN HFA) 108 (90 Base) MCG/ACT inhaler Inhale 1 puff into the lungs every 6 (six) hours as needed for wheezing or shortness of breath. 05/17/20  Yes Lamptey, Myrene Galas, MD  benztropine (COGENTIN) 1 MG tablet Take 1 tablet (1 mg total) by mouth 2 (two) times daily. 12/25/19  Yes Connye Burkitt, NP  ferrous sulfate 325 (65 FE) MG tablet Take 1 tablet (325 mg total) by mouth daily with breakfast. 12/29/19  Yes Kerin Perna, NP  FLUoxetine (PROZAC) 10 MG capsule Take 1 capsule (10 mg total) by mouth daily. 12/28/20  Yes Freida Busman, MD  OLANZapine zydis (ZYPREXA) 5 MG disintegrating tablet Take 0.5 tablets (2.5 mg total) by mouth at bedtime. 12/29/20  Yes Freida Busman, MD  pantoprazole (PROTONIX) 20 MG tablet Take 1 tablet (20 mg total) by mouth daily. 12/10/19  Yes Connye Burkitt, NP  triamcinolone (KENALOG) 0.1 % Apply 1 application topically 2 (two) times daily. 09/21/20  Yes Margarita Mail, PA-C    Allergies    Bactrim [sulfamethoxazole-trimethoprim], Blue dyes (parenteral), Codeine, Ibuprofen, Other, and Penicillins  Review of Systems   Review of Systems  Constitutional: Negative for chills and fever.  HENT: Negative for congestion and rhinorrhea.   Respiratory: Negative for cough and shortness of breath.   Cardiovascular: Negative for chest pain and palpitations.  Gastrointestinal: Negative for diarrhea, nausea and vomiting.  Genitourinary: Positive for menstrual problem. Negative for difficulty urinating and dysuria.  Musculoskeletal: Negative for arthralgias and back pain.  Skin: Negative for rash and wound.  Neurological: Positive for light-headedness. Negative for headaches.  Psychiatric/Behavioral: Positive for hallucinations.    Physical Exam Updated Vital Signs BP 103/67   Pulse 66   Temp 97.9 F (36.6 C) (Oral)   Resp 19   Ht 5\' 5"  (1.651 m)   SpO2 100%   BMI 33.28 kg/m   Physical Exam Vitals and nursing note reviewed. Exam conducted with a chaperone present.  Constitutional:      General: She is not in acute distress.    Appearance: Normal appearance.  HENT:     Head: Normocephalic and atraumatic.     Nose: No rhinorrhea.  Eyes:     General:        Right eye: No discharge.        Left eye: No discharge.     Conjunctiva/sclera: Conjunctivae normal.  Cardiovascular:     Rate and Rhythm: Normal rate and regular rhythm.     Heart sounds: No murmur  heard.   Pulmonary:     Effort: Pulmonary effort is normal. No respiratory distress.     Breath sounds: No stridor.  Abdominal:     General: Abdomen is flat. There is no distension.     Palpations: Abdomen is soft.     Tenderness: There is no abdominal tenderness.  Musculoskeletal:        General: No tenderness or signs of injury.  Skin:    General: Skin is warm and dry.  Neurological:     General: No focal deficit present.     Mental Status: She is alert. Mental status is at baseline.     Cranial Nerves: No cranial nerve deficit.     Motor: No weakness.     Gait: Gait normal.  Psychiatric:        Mood and Affect: Mood normal.        Behavior: Behavior normal.     Comments: Endorses auditory command hallucinations to harm others but has no intent or plan to harm others as of right now.  No suicidal ideation.     ED Results / Procedures / Treatments   Labs (all labs ordered are listed, but only abnormal results are displayed) Labs Reviewed  IRON AND TIBC - Abnormal; Notable for the following components:      Result Value   Iron 12 (*)    TIBC 463 (*)    Saturation Ratios 3 (*)    All other components within normal limits  FERRITIN - Abnormal; Notable for the following components:   Ferritin 3 (*)    All other components within normal limits  LACTATE DEHYDROGENASE  SAVE SMEAR (SSMR)  HAPTOGLOBIN  PATHOLOGIST SMEAR REVIEW  HIV ANTIBODY (ROUTINE TESTING W REFLEX)  HEMOGLOBIN AND HEMATOCRIT, BLOOD  PREPARE RBC (CROSSMATCH)    EKG EKG Interpretation  Date/Time:  Tuesday Dec 28 2020 08:44:07 EDT Ventricular Rate:  76 PR Interval:  146 QRS Duration: 88 QT Interval:  395 QTC Calculation: 445 R Axis:   59 Text Interpretation: Sinus rhythm Confirmed by Dewaine Conger [phone]) on 12/28/2020 10:23:04 AM   Radiology No results found.  Procedures Procedures   Medications Ordered in ED Medications  0.9 %  sodium chloride infusion (Manually program via Guardrails IV  Fluids) (has no administration in time range)  iron polysaccharides (NIFEREX) capsule 150 mg (has no administration in time range)  potassium chloride SA (KLOR-CON) CR tablet 40 mEq (has no administration in time range)  FLUoxetine (PROZAC) capsule 10 mg (has no administration in time range)  OLANZapine zydis (ZYPREXA) disintegrating tablet 2.5 mg (has no administration in time range)  polyethylene glycol (MIRALAX / GLYCOLAX) packet 17 g (has no administration in time range)    ED Course  I have reviewed the triage vital signs and the nursing notes.  Pertinent labs & imaging results that were available during my care of the patient were reviewed by me and considered in my medical decision making (see chart for details).    MDM Rules/Calculators/A&P                          HG 6.9 from [facility] for AH with command hallucination for HI.  Other labs fairly unremarkable.  Send iron studies as the MCV is low and likely iron deficiency anemia in the setting of heavy menstrual cycles.  Consulted medicine for admission and recommendations for transfusion of blood versus iron.  I feel given her stability and the chronicity of this likely more helpful to get iron infusions.  Consulted the medicine team and they agree for now hold on transfusion of blood and  we will consider iron supplementation.  As for the mental health aspect of her care I do feel that she would require inpatient evaluation and treatment so I have placed her under IVC and medicine team will consult psychiatry.   Final Clinical Impression(s) / ED Diagnoses Final diagnoses:  Microcytic anemia  History of command hallucinations    Rx / DC Orders ED Discharge Orders    None       Breck Coons, MD 12/28/20 1211

## 2020-12-28 NOTE — ED Notes (Addendum)
Pt A&O x 4, no distress noted, slightly anxious and cooperative.  Pt presents with depression, anxiety and SI. Denies at present..  Skin search completed, monitoring for safety.Pt reports she feels like she's dead and life isn't real.

## 2020-12-28 NOTE — ED Notes (Signed)
I was able to get patient blood.

## 2020-12-29 DIAGNOSIS — D500 Iron deficiency anemia secondary to blood loss (chronic): Secondary | ICD-10-CM | POA: Diagnosis not present

## 2020-12-29 DIAGNOSIS — Z8659 Personal history of other mental and behavioral disorders: Secondary | ICD-10-CM | POA: Diagnosis not present

## 2020-12-29 LAB — CBC
HCT: 27.5 % — ABNORMAL LOW (ref 36.0–46.0)
Hemoglobin: 7.5 g/dL — ABNORMAL LOW (ref 12.0–15.0)
MCH: 17.6 pg — ABNORMAL LOW (ref 26.0–34.0)
MCHC: 27.3 g/dL — ABNORMAL LOW (ref 30.0–36.0)
MCV: 64.6 fL — ABNORMAL LOW (ref 80.0–100.0)
Platelets: 392 10*3/uL (ref 150–400)
RBC: 4.26 MIL/uL (ref 3.87–5.11)
RDW: 21.4 % — ABNORMAL HIGH (ref 11.5–15.5)
WBC: 5.7 10*3/uL (ref 4.0–10.5)
nRBC: 0 % (ref 0.0–0.2)

## 2020-12-29 LAB — HAPTOGLOBIN: Haptoglobin: 171 mg/dL (ref 33–278)

## 2020-12-29 LAB — PATHOLOGIST SMEAR REVIEW

## 2020-12-29 LAB — HEMOGLOBIN A1C
Hgb A1c MFr Bld: 5.1 % (ref 4.8–5.6)
Mean Plasma Glucose: 100 mg/dL

## 2020-12-29 LAB — BASIC METABOLIC PANEL
Anion gap: 6 (ref 5–15)
BUN: 11 mg/dL (ref 6–20)
CO2: 24 mmol/L (ref 22–32)
Calcium: 8.5 mg/dL — ABNORMAL LOW (ref 8.9–10.3)
Chloride: 108 mmol/L (ref 98–111)
Creatinine, Ser: 0.69 mg/dL (ref 0.44–1.00)
GFR, Estimated: >60 mL/min (ref >60)
Glucose, Bld: 94 mg/dL (ref 70–99)
Potassium: 3.3 mmol/L — ABNORMAL LOW (ref 3.5–5.1)
Sodium: 138 mmol/L (ref 135–145)

## 2020-12-29 LAB — PREPARE RBC (CROSSMATCH)

## 2020-12-29 MED ORDER — OLANZAPINE 5 MG PO TABS: 2.5000 mg | ORAL_TABLET | Freq: Every day | ORAL | Status: DC

## 2020-12-29 MED ORDER — SODIUM CHLORIDE 0.9 % IV SOLN
510.0000 mg | Freq: Once | INTRAVENOUS | Status: AC
  Administered 2020-12-29: 510 mg via INTRAVENOUS
  Filled 2020-12-29: qty 17

## 2020-12-29 MED ORDER — POLYETHYLENE GLYCOL 3350 17 G PO PACK
17.0000 g | PACK | Freq: Every day | ORAL | 0 refills | Status: DC | PRN
Start: 2020-12-29 — End: 2021-08-15

## 2020-12-29 MED ORDER — OLANZAPINE 2.5 MG PO TABS: 2.5000 mg | ORAL_TABLET | Freq: Every day | ORAL | 0 refills | Status: DC

## 2020-12-29 MED ORDER — POTASSIUM CHLORIDE 20 MEQ PO PACK
40.0000 meq | PACK | Freq: Once | ORAL | Status: AC
  Administered 2020-12-29: 40 meq via ORAL
  Filled 2020-12-29: qty 2

## 2020-12-29 MED ORDER — POLYSACCHARIDE IRON COMPLEX 150 MG PO CAPS: 150.0000 mg | ORAL_CAPSULE | Freq: Every day | ORAL | 0 refills | Status: DC

## 2020-12-29 NOTE — Discharge Instructions (Signed)
Dear Sandra Macdonald,   Thank you for letting us participate in your care! In this section, you will find a brief hospital admission summary of why you were admitted to the hospital, what happened during your admission, your diagnosis/diagnoses, and recommended follow up.   You were admitted because you were experiencing low hemoglobin of 6.9   Your testing revealed Iron deficiency anemia.   You were diagnosed with Iron deficiency anemia  You were treated with Blood transfusion and IV iron and recommended for oral iron at discharge.Marland Kitchen   You were also seen by Psychiatry. They recommended to discharge Pt on medications and follow up on Outpatient basis.  Your condition improved and you were discharged from the hospital for meeting this goal.   POST-HOSPITAL & CARE INSTRUCTIONS . Follow up with IM Clinic in 1 week.  . Need Referal to Gynecologist for menorrhagia . Follow up with psychiatry in 1 week at [facility]. Walk in at [facility] between Mon to Thursday 8-11 am. Be there at 7.15 am to be seen by a provider.  . Please let PCP/Specialists know of any changes in medications that were made.  . Please see medications section of this packet for any medication changes.   DOCTOR'S APPOINTMENTS & FOLLOW UP No future appointments.   Thank you for choosing [facility]! Take care and be well!  Internal  [facility]  [address] [place], [place] [address] [phone]

## 2020-12-29 NOTE — Consult Note (Addendum)
[facility] Face-to-Face Psychiatry Consult   Reason for Consult:  Command Hallucinations Referring Physician:  Angelica Pou MD Patient Identification: Sandra [facility] MRN:  [id number] Principal Diagnosis: Iron deficiency anemia Diagnosis:  Principal Problem:   Iron deficiency anemia Active Problems:   Depression with psychosis    Total Time spent with patient: 30 minutes  Subjective:   Sandra Macdonald is a 25 y.o. female patient admitted with anemia and command hallucinations/psychosis. 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  HPI:  She reports that she stopped taking her psychiatric medications after her previous [facility] admission because of the side effects. She reports that she had lock jaw, drooling, and tremors. She reports that she has tolerated the restarting of medications at [facility] with no side effects. She reports that she is willing to continue taking them and will follow up with outpatient psychiatry. She reports that currently she has no SI, HI, or VH. She reports she still has some AH but that it is background noise that she cannot make out what is being said and she can easily ignore it.   Discussed what she will do if the voices become distinct again or if she has thoughts of hurting herself she says she will call her mother. Discussed that [facility] is always open 24/7 and that she can always go there or to the nearest ED and she reported understanding.  Past Psychiatric History: Admitted to [facility] 2021  Risk to Self:  No Risk to Others:  No Prior Inpatient Therapy:  [facility] 2021 Prior Outpatient Therapy:  Did not attend  Past Medical History:  Past Medical History:  Diagnosis Date  . Depression   . Fibroid   . G6PD deficiency   . Supervision of normal first pregnancy, antepartum 02/18/2018    Nursing Staff Provider Office Location  [facility] Dating   LMP/6 week [place]  Language   English  Anatomy US   Flu Vaccine  n/a Genetic Screen  NIPS: low risk, female  AFP:   TDaP vaccine    Hgb A1C or  GTT Early  Third trimester  Rhogam     LAB RESULTS  Feeding Plan Breast Blood Type A/Positive/-- (07/15 0942)  Contraception  Antibody Negative (07/15 0942) Circumcision  Rubella 3.00 (07/15 0626) Pediatricia    Past Surgical History:  Procedure Laterality Date  . NO PAST SURGERIES     Family History:  Family History  Problem Relation Age of Onset  . Mental illness Mother   . ADD / ADHD Neg Hx   . Anxiety disorder Neg Hx   . Alcohol abuse Neg Hx   . Arthritis Neg Hx   . Asthma Neg Hx   . Cancer Neg Hx   . Birth defects Neg Hx   . Depression Neg Hx   . COPD Neg Hx   . Diabetes Neg Hx   . Drug abuse Neg Hx   . Early death Neg Hx   . Hearing loss Neg Hx   . Heart disease Neg Hx   . Hyperlipidemia Neg Hx   . Hypertension Neg Hx   . Intellectual disability Neg Hx   . Kidney disease Neg Hx   . Learning disabilities Neg Hx   . Miscarriages / Stillbirths Neg Hx   . Stroke Neg Hx   . Obesity Neg Hx   . Vision loss Neg Hx   . Varicose  Veins Neg Hx    Family Psychiatric  History: Schizophrenia Social History:  Social History   Substance and Sexual Activity  Alcohol Use Not Currently  . Alcohol/week: 1.0 standard drink  . Types: 1 Standard drinks or equivalent per week   Comment: per pt drinks socially 1-2x month on average     Social History   Substance and Sexual Activity  Drug Use Not Currently  . Frequency: 14.0 times per week  . Types: Marijuana   Comment: pt reports 1-2 blunts on work days, more on days off    Social History   Socioeconomic History  . Marital status: Single    Spouse name: Not on file  . Number of children: Not on file  . Years of education: Not on file  . Highest education level: 10th grade  Occupational History  . Occupation: Disabled  Tobacco Use  . Smoking status: Never Smoker  . Smokeless tobacco:  Never Used  Vaping Use  . Vaping Use: Never used  Substance and Sexual Activity  . Alcohol use: Not Currently    Alcohol/week: 1.0 standard drink    Types: 1 Standard drinks or equivalent per week    Comment: per pt drinks socially 1-2x month on average  . Drug use: Not Currently    Frequency: 14.0 times per week    Types: Marijuana    Comment: pt reports 1-2 blunts on work days, more on days off  . Sexual activity: Not Currently    Birth control/protection: None  Other Topics Concern  . Not on file  Social History Narrative   Pt lives with mother in [place].  She has an upcoming appointment with [place] (on June 2).     Social Determinants of Health   Financial Resource Strain: Not on file  Food Insecurity: Not on file  Transportation Needs: Not on file  Physical Activity: Not on file  Stress: Not on file  Social Connections: Not on file   Additional Social History:    Allergies:   Allergies  Allergen Reactions  . Bactrim [Sulfamethoxazole-Trimethoprim]     Trouble breathing, swelling  . Blue Dyes (Parenteral) Other (See Comments)    G6PD-- Decreased hemoglobin  . Codeine Other (See Comments)    Decreased hemoglobin  . Ibuprofen     Trouble breathing, swelling  . Other Other (See Comments)    G6PD--Decreased hemoglobin to potato chips (not potatoes in other forms)  . Penicillins     Trouble breathing, swelling    Labs:  Results for orders placed or performed during the hospital encounter of 12/28/20 (from the past 48 hour(s))  Iron and TIBC     Status: Abnormal   Collection Time: 12/28/20  8:56 AM  Result Value Ref Range   Iron 12 (L) 28 - 170 ug/dL   TIBC 463 (H) 250 - 450 ug/dL   Saturation Ratios 3 (L) 10.4 - 31.8 %   UIBC 451 ug/dL    Comment: Performed at [facility] [address], [place], [place] [address]  Ferritin     Status: Abnormal   Collection Time: 12/28/20 10:42 AM  Result Value Ref Range   Ferritin 3 (L)  11 - 307 ng/mL    Comment: Performed at [facility], [place] [address] [place], [place] [address]  Haptoglobin     Status: None   Collection Time: 12/28/20 10:42 AM  Result Value Ref Range   Haptoglobin 171 33 - 278  mg/dL    Comment: (NOTE) Performed At: [facility] [place], [place] [id number] Rush Farmer MD [id number]   Lactate dehydrogenase     Status: None   Collection Time: 12/28/20 10:42 AM  Result Value Ref Range   LDH 125 98 - 192 U/L    Comment: Performed at [facility], [place] [address] [place], [place] [address]  Save Smear     Status: None   Collection Time: 12/28/20 10:42 AM  Result Value Ref Range   Smear Review SMEAR STAINED AND AVAILABLE FOR REVIEW     Comment: Performed at [facility] [address], [place], [place] [address]  HIV Antibody (routine testing w rflx)     Status: None   Collection Time: 12/28/20 10:58 AM  Result Value Ref Range   HIV Screen 4th Generation wRfx Non Reactive Non Reactive    Comment: Performed at [facility], [place] [address], [place], [place] [address]  Hemoglobin and hematocrit, blood     Status: Abnormal   Collection Time: 12/28/20 11:37 AM  Result Value Ref Range   Hemoglobin 6.8 (LL) 12.0 - 15.0 g/dL    Comment: CRITICAL VALUE NOTED.  VALUE IS CONSISTENT WITH PREVIOUSLY REPORTED AND CALLED VALUE. REPEATED TO VERIFY    HCT 25.8 (L) 36.0 - 46.0 %    Comment: Performed at [facility], [place] [address], [place], [place] [address]  Prepare RBC (crossmatch)     Status: None   Collection Time: 12/28/20  9:57 PM  Result Value Ref Range   Order Confirmation      ORDER PROCESSED BY BLOOD BANK Performed at [facility], [place] [address] [place], [place] [address]   Type and screen [place]     Status: None (Preliminary result)   Collection Time: 12/28/20 10:00 PM  Result Value Ref Range   ABO/RH(D) A POS    Antibody Screen NEG    Sample Expiration  12/31/2020,2359    Unit Number [id number]    Blood Component Type RED CELLS,LR    Unit division 00    Status of Unit ISSUED    Transfusion Status OK TO TRANSFUSE    Crossmatch Result      Compatible Performed at [facility], [place] [address], [place], [place] [address]   Basic metabolic panel     Status: Abnormal   Collection Time: 12/29/20  8:34 AM  Result Value Ref Range   Sodium 138 135 - 145 mmol/L   Potassium 3.3 (L) 3.5 - 5.1 mmol/L   [place] 108 98 - 111 mmol/L   CO2 24 22 - 32 mmol/L   Glucose, Bld 94 70 - 99 mg/dL    Comment: Glucose reference range applies only to samples taken after fasting for at least 8 hours.   BUN 11 6 - 20 mg/dL   Creatinine, Ser 0.69 0.44 - 1.00 mg/dL   Calcium 8.5 (L) 8.9 - 10.3 mg/dL   GFR, Estimated >60 >60 mL/min    Comment: (NOTE) Calculated using the CKD-EPI Creatinine Equation (2021)    Anion gap 6 5 - 15    Comment: Performed at [facility] [address] [place], [place] [address]  CBC     Status: Abnormal   Collection Time: 12/29/20  8:34 AM  Result Value Ref Range   WBC 5.7 4.0 - 10.5 K/uL   RBC 4.26 3.87 - 5.11 MIL/uL  Hemoglobin 7.5 (L) 12.0 - 15.0 g/dL    Comment: REPEATED TO VERIFY POST TRANSFUSION SPECIMEN Reticulocyte Hemoglobin testing may be clinically indicated, consider ordering this additional test [id number]    HCT 27.5 (L) 36.0 - 46.0 %   MCV 64.6 (L) 80.0 - 100.0 fL   MCH 17.6 (L) 26.0 - 34.0 pg   MCHC 27.3 (L) 30.0 - 36.0 g/dL   RDW 21.4 (H) 11.5 - 15.5 %   Platelets 392 150 - 400 K/uL   nRBC 0.0 0.0 - 0.2 %    Comment: Performed at [facility], [place] [address], [place], [place] [address]    Current Facility-Administered Medications  Medication Dose Route Frequency Provider Last Rate Last Admin  . acetaminophen (TYLENOL) tablet 650 mg  650 mg Oral Q6H PRN Alexandria Lodge, MD   650 mg at 12/28/20 2205  . Chlorhexidine Gluconate Cloth 2 % PADS 6 each  6 each Topical Daily Angelica Pou, MD   6 each at 12/29/20 1048  . FLUoxetine (PROZAC) capsule 10 mg  10 mg Oral Daily Agyei, Obed K, MD   10 mg at 12/29/20 1049  . iron polysaccharides (NIFEREX) capsule 150 mg  150 mg Oral Daily Agyei, Obed K, MD   150 mg at 12/29/20 1049  . OLANZapine (ZYPREXA) tablet 2.5 mg  2.5 mg Oral QHS Charls Custer, Redgie Grayer, MD      . polyethylene glycol (MIRALAX / GLYCOLAX) packet 17 g  17 g Oral Daily PRN Agyei, Obed K, MD      . potassium chloride SA (KLOR-CON) CR tablet 40 mEq  40 mEq Oral Once Agyei, Obed K, MD      . sodium chloride flush (NS) 0.9 % injection 10-40 mL  10-40 mL Intracatheter Q12H Angelica Pou, MD   10 mL at 12/29/20 1049  . sodium chloride flush (NS) 0.9 % injection 10-40 mL  10-40 mL Intracatheter PRN Angelica Pou, MD        Musculoskeletal: Strength & Muscle Tone: within normal limits Gait & Station: in bed during interview Patient leans: N/A            Psychiatric Specialty Exam:  Presentation  General Appearance: Appropriate for Environment  Eye Contact:Fair  Speech:Clear and Coherent; Slow  Speech Volume:Normal  Handedness:Right   Mood and Affect  Mood:Euthymic  Affect:Congruent   Thought Process  Thought Processes:Coherent  Descriptions of Associations:Intact  Orientation:Full (Time, Place and Person)  Thought Content:Logical  History of Schizophrenia/Schizoaffective disorder:No  Duration of Psychotic Symptoms:Less than six months  Hallucinations:Hallucinations: Auditory Description of Auditory Hallucinations: minimal background noise  Ideas of Reference:None  Suicidal Thoughts:Suicidal Thoughts: No SI Passive Intent and/or Plan: Without Plan; Without Intent  Homicidal Thoughts:Homicidal Thoughts: No HI Active Intent and/or Plan: Without Intent; Without Plan   Sensorium  Memory:Immediate Fair; Recent Fair  Judgment:Fair  Insight:Fair   Executive Functions  Concentration:Good  Attention  Span:Good  [place] of Knowledge:Good  Language:Good   Psychomotor Activity  Psychomotor Activity:Psychomotor Activity: Normal   Assets  Assets:Resilience   Sleep  Sleep:Sleep: Fair Number of Hours of Sleep: 6   Physical Exam: Physical Exam Vitals and nursing note reviewed.  Constitutional:      General: She is not in acute distress.    Appearance: Normal appearance. She is not ill-appearing, toxic-appearing or diaphoretic.  HENT:     Head: Normocephalic and atraumatic.  Cardiovascular:     Rate and Rhythm: Normal rate.  Pulmonary:  Effort: Pulmonary effort is normal.  Musculoskeletal:        General: Normal range of motion.  Neurological:     Mental Status: She is alert.    Review of Systems  Constitutional: Negative for chills and fever.  Respiratory: Negative for cough and shortness of breath.   Cardiovascular: Negative for chest pain.  Gastrointestinal: Negative for abdominal pain, constipation, diarrhea, nausea and vomiting.  Neurological: Positive for headaches (mild). Negative for weakness.  Psychiatric/Behavioral: Negative for suicidal ideas. The patient is not nervous/anxious.    Blood pressure 116/65, pulse 80, temperature 98.2 F (36.8 C), temperature source Oral, resp. rate 16, height 5\' 5"  (1.651 m), SpO2 100 %. Body mass index is 33.28 kg/m.  Treatment Plan Summary: Case discussed with Dr. Dwyane Dee Medication management  At this point she is stable and not responding to internal stimuli. Will not make changes to medications at this point and recommend staying at current doses and making her follow up appointments. At that point further medication changes can be made if needed.  -Continue Zyprexa 2.5 mg QHS -Continue Prozac 10 mg daily -Make and keep follow up outpatient Psychiatry appointment   Disposition: No evidence of imminent risk to self or others at present.   Discussed crisis plan, support from social network, calling 911,  coming to the Emergency Department, and calling Suicide Hotline.  Briant Cedar, MD 12/29/2020 11:12 AM

## 2020-12-29 NOTE — Progress Notes (Signed)
Subjective: O/n event- Reported Pounding headache- Got tylenol Pt is seen by medical team this morning. Pt states she is feeling good and not having any symptoms. She denies any SOB, bleeding, or pain. She denies any hallucinations, SI or HI. She doesn't have Obgyn or a PCP. We offered her to come here at IM clinic for Follow up. Discussed need for follow up with Gynecologist for fibroids and menorrhagia.  Pt agrees with plan.  Objective:  Vital signs in last 24 hours: Vitals:   12/29/20 0255 12/29/20 0316 12/29/20 0316 12/29/20 0528  BP: 112/65 119/71 119/71 (!) 105/51  Pulse: 87 68 68 79  Resp: 16 17 17    Temp: 98.3 F (36.8 C) 98.5 F (36.9 C) 98.5 F (36.9 C) 97.9 F (36.6 C)  TempSrc: Oral  Oral Oral  SpO2: 100%  100% 100%  Height:       CBC Latest Ref Rng & Units 12/28/2020 12/28/2020 12/07/2019  WBC 4.0 - 10.5 K/uL - 7.3 6.6  Hemoglobin 12.0 - 15.0 g/dL 6.8(LL) 6.9(LL) 9.8(L)  Hematocrit 36.0 - 46.0 % 25.8(L) 26.2(L) 33.8(L)  Platelets 150 - 400 K/uL - 460(H) 450(H)   CMP Latest Ref Rng & Units 12/28/2020 12/02/2019 12/30/2017  Glucose 70 - 99 mg/dL 100(H) 101(H) 95  BUN 6 - 20 mg/dL 12 13 10   Creatinine 0.44 - 1.00 mg/dL 0.66 0.73 0.66  Sodium 135 - 145 mmol/L 138 137 137  Potassium 3.5 - 5.1 mmol/L 3.3(L) 3.6 3.4(L)  [place] 98 - 111 mmol/L 107 103 105  CO2 22 - 32 mmol/L 25 25 23   Calcium 8.9 - 10.3 mg/dL 8.6(L) 9.4 9.2  Total Protein 6.5 - 8.1 g/dL 8.1 9.0(H) 7.7  Total Bilirubin 0.3 - 1.2 mg/dL 0.5 0.5 0.3  Alkaline Phos 38 - 126 U/L 49 59 43  AST 15 - 41 U/L 15 23 16   ALT 0 - 44 U/L 17 25 12(L)    Physical Exam Constitutional: Pt lying on bed comfortably. Not in acute distress HEENT: Mild pallor present  Cardiovascular: regular rate and rhythm, normal heart sounds Pulmonary: effort normal, lungs clear to ascultation bilaterally Musculoskeletal: warm, Peripheral pulses equal b/l. Skin: warm and dry Neurological: alert, speech normal Psychiatric: normal mood  and affect  Assessment/Plan: Kimberl Vig is a 25 y.o. female with hx of iron deficiency anemia, menorrhagia, uterine fibroids, G6PD deficiency anemia, depression with psychosis, prior behavioral health admission for psychosis here for management of worsening iron deficiency anemia.    Principal Problem:   Iron deficiency anemia Active Problems:   Depression with psychosis   #Iron deficiency anemia #Menorrhagia #Uterine fibroids #Hx of G6PD deficiency anemia Iron deficiency anemia secondary to menorrhagia and Uterine Myoma CBC at admission shows hemoglobin 6.9, MCV 62, RDW 19.8. Blood smear, haptoglobin and LDH ordered to rule out underlying hemolysis. LDH normal. HIV neg. Pathologist smear and Haptoglobin pending.  Iron studies showed low iron of 12, UIBC 451, TIBC 463 and low ferritin of 3. Pt received 1 unit PRBC. Hb post transfusion 7.5. Started on Nuiron polysaccharide 150 mg daily.  Patient will get IV iron prior to discharge. --F/U haptoglobin and Pathologist smear  - IV iron ordered. -Continue and Nu iron polysaccharide 150 mg daily. -Patient needs to follow-up with OB/GYN after discharge for further management of menorrhagia/fibroids.   -Pt will f/u IM clinic for PCP.  #Hypokalemia -K+ of 3.3 yesterday.  Repleted.  Potassium still low at 3.3 today -Replete with 40 mEq.  #Depression with psychosis -1:1 sitter -  Zyprexa 2.5 mg nightly -Prozac 10 mg daily -Psychiatry consulted. Will see Pt today.   Diet:  regular IVF:  none VTE:  SCD's Prior to Admission Living Arrangement:  Home Anticipated Discharge Location:  [place] or home depending on Psych recs Barriers to Discharge: Can go home if cleared by psychiatry.  Dispo: Anticipated discharge today.  As per psychiatry recs.  Armando Reichert, MD 12/29/2020, 6:16 AM Pager: [phone] After 5pm on weekdays and 1pm on weekends: On Call pager [phone]

## 2020-12-30 ENCOUNTER — Encounter (HOSPITAL_COMMUNITY): Payer: Self-pay

## 2020-12-30 ENCOUNTER — Other Ambulatory Visit: Payer: Self-pay

## 2020-12-30 ENCOUNTER — Emergency Department (HOSPITAL_COMMUNITY)
Admission: EM | Admit: 2020-12-30 | Discharge: 2020-12-30 | Disposition: A | Discharge status: Home / Self Care | Payer: Medicaid Other | Attending: Emergency Medicine | Admitting: Emergency Medicine

## 2020-12-30 DIAGNOSIS — K5900 Constipation, unspecified: Secondary | ICD-10-CM | POA: Diagnosis not present

## 2020-12-30 DIAGNOSIS — R1011 Right upper quadrant pain: Secondary | ICD-10-CM | POA: Diagnosis present

## 2020-12-30 LAB — COMPREHENSIVE METABOLIC PANEL
ALT: 13 U/L (ref 0–44)
AST: 12 U/L — ABNORMAL LOW (ref 15–41)
Albumin: 3.5 g/dL (ref 3.5–5.0)
Alkaline Phosphatase: 46 U/L (ref 38–126)
Anion gap: 5 (ref 5–15)
BUN: 12 mg/dL (ref 6–20)
CO2: 25 mmol/L (ref 22–32)
Calcium: 9 mg/dL (ref 8.9–10.3)
Chloride: 108 mmol/L (ref 98–111)
Creatinine, Ser: 0.7 mg/dL (ref 0.44–1.00)
GFR, Estimated: >60 mL/min (ref >60)
Glucose, Bld: 95 mg/dL (ref 70–99)
Potassium: 3.6 mmol/L (ref 3.5–5.1)
Sodium: 138 mmol/L (ref 135–145)
Total Bilirubin: 0.4 mg/dL (ref 0.3–1.2)
Total Protein: 7.5 g/dL (ref 6.5–8.1)

## 2020-12-30 LAB — TYPE AND SCREEN
ABO/RH(D): A POS
Antibody Screen: NEGATIVE
Unit division: 0

## 2020-12-30 LAB — CBC WITH DIFFERENTIAL/PLATELET
Abs Immature Granulocytes: 0.04 10*3/uL (ref 0.00–0.07)
Basophils Absolute: 0 10*3/uL (ref 0.0–0.1)
Basophils Relative: 1 %
Eosinophils Absolute: 0.1 10*3/uL (ref 0.0–0.5)
Eosinophils Relative: 1 %
HCT: 28.6 % — ABNORMAL LOW (ref 36.0–46.0)
Hemoglobin: 7.7 g/dL — ABNORMAL LOW (ref 12.0–15.0)
Immature Granulocytes: 1 %
Lymphocytes Relative: 34 %
Lymphs Abs: 2.9 10*3/uL (ref 0.7–4.0)
MCH: 17.5 pg — ABNORMAL LOW (ref 26.0–34.0)
MCHC: 26.9 g/dL — ABNORMAL LOW (ref 30.0–36.0)
MCV: 65.1 fL — ABNORMAL LOW (ref 80.0–100.0)
Monocytes Absolute: 0.9 10*3/uL (ref 0.1–1.0)
Monocytes Relative: 11 %
Neutro Abs: 4.6 10*3/uL (ref 1.7–7.7)
Neutrophils Relative %: 52 %
Platelets: 406 10*3/uL — ABNORMAL HIGH (ref 150–400)
RBC: 4.39 MIL/uL (ref 3.87–5.11)
RDW: 22 % — ABNORMAL HIGH (ref 11.5–15.5)
WBC: 8.6 10*3/uL (ref 4.0–10.5)
nRBC: 0 % (ref 0.0–0.2)

## 2020-12-30 LAB — BPAM RBC
Blood Product Expiration Date: 202206102359
ISSUE DATE / TIME: 202205250255
Unit Type and Rh: 6200

## 2020-12-30 MED ORDER — MAGNESIUM CITRATE PO SOLN
1.0000 | Freq: Once | ORAL | Status: AC
  Administered 2020-12-30: 1 via ORAL
  Filled 2020-12-30: qty 296

## 2020-12-30 MED ORDER — FENTANYL CITRATE (PF) 100 MCG/2ML IJ SOLN: 50.0000 ug | INTRAMUSCULAR | Status: DC | PRN

## 2020-12-30 MED ORDER — ONDANSETRON HCL 4 MG/2ML IJ SOLN: 4.0000 mg | Freq: Once | INTRAMUSCULAR | Status: DC

## 2020-12-30 MED ORDER — SODIUM CHLORIDE 0.9 % IV BOLUS: 1000.0000 mL | Freq: Once | INTRAVENOUS | Status: DC

## 2020-12-30 NOTE — ED Provider Notes (Signed)
[place] DEPT Provider Note   CSN: [id number] Arrival date & time: 12/30/20  2536     History Chief Complaint  Patient presents with  . Headache  . Abdominal Pain    Sandra Macdonald is a 25 y.o. female.  HPI She complains of crampy abdominal pain right upper quadrant, for 2 days, without abdominal movement for the last 3 days.  She recently hospitalized and received blood transfusions for anemia of unknown cause.  She has history of G6PD deficiency.  She denies fever, chills, cough, shortness of breath, nausea, diarrhea, dysuria, urinary frequency, weakness or dizziness.  There are no other known modifying factors.    Past Medical History:  Diagnosis Date  . Depression   . Fibroid   . G6PD deficiency   . Supervision of normal first pregnancy, antepartum 02/18/2018    Nursing Staff Provider Office Location  [facility] Dating  LMP/6 week [place]  Language   English  Anatomy US   Flu Vaccine  n/a Genetic Screen  NIPS: low risk, female  AFP:   TDaP vaccine    Hgb A1C or  GTT Early  Third trimester  Rhogam     LAB RESULTS  Feeding Plan Breast Blood Type A/Positive/-- (07/15 0942)  Contraception  Antibody Negative (07/15 0942) Circumcision  Rubella 3.00 (07/15 6440) Pediatricia    Patient Active Problem List   Diagnosis Date Noted  . History of command hallucinations   . Iron deficiency anemia 12/28/2020  . 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 Psychosis ([place]) 12/03/2019  . 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 Vaginal bleeding before [redacted] weeks gestation 03/20/2018  . SAB (spontaneous abortion) 03/20/2018  . 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 G6PD deficiency anemia ([place]) 01/14/2018  . Fibroids 12/30/2017    Past Surgical History:  Procedure Laterality Date  . NO PAST SURGERIES       OB History    Gravida  2   Para      Term      Preterm      AB  2    Living        SAB  2   IAB      Ectopic      Multiple      Live Births              Family History  Problem Relation Age of Onset  . Mental illness Mother   . ADD / ADHD Neg Hx   . Anxiety disorder Neg Hx   . Alcohol abuse Neg Hx   . Arthritis Neg Hx   . Asthma Neg Hx   . Cancer Neg Hx   . Birth defects Neg Hx   . Depression Neg Hx   . COPD Neg Hx   . Diabetes Neg Hx   . Drug abuse Neg Hx   . Early death Neg Hx   . Hearing loss Neg Hx   . Heart disease Neg Hx   . Hyperlipidemia Neg Hx   . Hypertension Neg Hx   . Intellectual disability Neg Hx   . Kidney disease Neg Hx   . Learning disabilities Neg Hx   . Miscarriages / Stillbirths Neg Hx   . Stroke Neg Hx   . Obesity Neg Hx   . Vision loss Neg Hx   . Varicose Veins Neg Hx     Social History   Tobacco Use  .  Smoking status: Never Smoker  . Smokeless tobacco: Never Used  Vaping Use  . Vaping Use: Never used  Substance Use Topics  . Alcohol use: Not Currently    Alcohol/week: 1.0 standard drink    Types: 1 Standard drinks or equivalent per week    Comment: per pt drinks socially 1-2x month on average  . Drug use: Not Currently    Frequency: 14.0 times per week    Types: Marijuana    Comment: pt reports 1-2 blunts on work days, more on days off    Home Medications Prior to Admission medications   Medication Sig Start Date End Date Taking? Authorizing Provider  albuterol (VENTOLIN HFA) 108 (90 Base) MCG/ACT inhaler Inhale 1 puff into the lungs every 6 (six) hours as needed for wheezing or shortness of breath. 05/17/20   Chase Picket, MD  FLUoxetine (PROZAC) 10 MG capsule Take 1 capsule (10 mg total) by mouth daily. 12/28/20   Freida Busman, MD  iron polysaccharides (NIFEREX) 150 MG capsule Take 1 capsule (150 mg total) by mouth daily. 12/29/20   Armando Reichert, MD  OLANZapine (ZYPREXA) 2.5 MG tablet Take 1 tablet (2.5 mg total) by mouth at bedtime. 12/29/20   Armando Reichert, MD  polyethylene  glycol (MIRALAX / GLYCOLAX) 17 g packet Take 17 g by mouth daily as needed for mild constipation. 12/29/20   Armando Reichert, MD  triamcinolone (KENALOG) 0.1 % Apply 1 application topically 2 (two) times daily. 09/21/20   Margarita Mail, PA-C    Allergies    Bactrim [sulfamethoxazole-trimethoprim], Blue dyes (parenteral), Codeine, Ibuprofen, Other, and Penicillins  Review of Systems   Review of Systems  All other systems reviewed and are negative.   Physical Exam Updated Vital Signs BP 120/62 (BP Location: Right Arm)   Pulse 80   Temp 98.5 F (36.9 C) (Oral)   Resp (!) 26   Ht 5\' 5"  (1.651 m)   Wt 77.1 kg   SpO2 99%   BMI 28.29 kg/m   Physical Exam Vitals and nursing note reviewed.  Constitutional:      General: She is not in acute distress.    Appearance: She is well-developed. She is obese. She is not ill-appearing, toxic-appearing or diaphoretic.  HENT:     Head: Normocephalic and atraumatic.     Right Ear: External ear normal.     Left Ear: External ear normal.  Eyes:     Conjunctiva/sclera: Conjunctivae normal.     Pupils: Pupils are equal, round, and reactive to light.  Neck:     Trachea: Phonation normal.  Cardiovascular:     Rate and Rhythm: Normal rate and regular rhythm.     Heart sounds: Normal heart sounds. No murmur heard.   Pulmonary:     Effort: Pulmonary effort is normal. No respiratory distress.     Breath sounds: Normal breath sounds. No stridor.  Abdominal:     General: There is no distension.     Palpations: Abdomen is soft. There is no mass.     Tenderness: There is abdominal tenderness (Right upper quadrant, mild). There is no guarding or rebound.     Hernia: No hernia is present.  Musculoskeletal:        General: Normal range of motion.     Cervical back: Normal range of motion and neck supple.  Skin:    General: Skin is warm and dry.     Coloration: Skin is not jaundiced or pale.  Neurological:  Mental Status: She is alert and  oriented to person, place, and time.     Cranial Nerves: No cranial nerve deficit.     Sensory: No sensory deficit.     Motor: No abnormal muscle tone.     Coordination: Coordination normal.  Psychiatric:        Mood and Affect: Mood normal.        Behavior: Behavior normal.        Thought Content: Thought content normal.        Judgment: Judgment normal.     ED Results / Procedures / Treatments   Labs (all labs ordered are listed, but only abnormal results are displayed) Labs Reviewed  COMPREHENSIVE METABOLIC PANEL - Abnormal; Notable for the following components:      Result Value   AST 12 (*)    All other components within normal limits  CBC WITH DIFFERENTIAL/PLATELET - Abnormal; Notable for the following components:   Hemoglobin 7.7 (*)    HCT 28.6 (*)    MCV 65.1 (*)    MCH 17.5 (*)    MCHC 26.9 (*)    RDW 22.0 (*)    Platelets 406 (*)    All other components within normal limits    EKG None  Radiology No results found.  Procedures Procedures   Medications Ordered in ED Medications  magnesium citrate solution 1 Bottle (1 Bottle Oral Given 12/30/20 0725)    ED Course  I have reviewed the triage vital signs and the nursing notes.  Pertinent labs & imaging results that were available during my care of the patient were reviewed by me and considered in my medical decision making (see chart for details).    MDM Rules/Calculators/A&P                           Patient Vitals for the past 24 hrs:  BP Temp Temp src Pulse Resp SpO2 Height Weight  12/30/20 0913 120/62 -- -- 80 (!) 26 99 % -- --  12/30/20 0910 -- -- -- 95 (!) 27 100 % -- --  12/30/20 0805 123/78 -- -- 66 18 100 % -- --  12/30/20 0731 -- -- -- -- (!) 21 -- -- --  12/30/20 0727 123/78 -- -- -- (!) 33 -- -- --  12/30/20 0647 (!) 151/87 98.5 F (36.9 C) Oral 87 17 97 % 5\' 5"  (1.651 m) 77.1 kg    9:13 AM Reevaluation with update and discussion. After initial assessment and treatment, an updated  evaluation reveals the patient is comfortable.  Findings discussed with her and all questions were answered.  She states she does have a follow-up appointment, with a Primary care doctor within the week.  All questions answered. Daleen Bo   Medical Decision Making:  This patient is presenting for evaluation of right upper quadrant pain, felt as a cramping sensation associated with decreased stooling over the last 2 to 3 days., which does require a range of treatment options, and is a complaint that involves a low risk of morbidity and mortality. The differential diagnoses include constipation, acute illness, metabolic disorder. I decided to review old records, and in summary [place] female, recently hospitalized and treated for anemia with blood transfusion.  Presenting with upper abdominal cramping which is nonspecific and associated with decreased stooling.  I did not require additional historical information from anyone.  Clinical Laboratory Tests Ordered, included CBC and Metabolic panel. Review indicates normal except hemoglobin  low, MCV low, platelets high.  Critical Interventions-clinical evaluation, medication treatment, laboratory testing, observation reassessment  After These Interventions, the Patient was reevaluated and was found stable for discharge with treatment for constipation.  Persistent anemia, somewhat higher than when discharged 2 days ago.  No indication for ongoing active bleeding.  She has a short-term follow-up scheduled with her primary care doctor.  We will treat symptomatically at this time and counseled on needs to return  CRITICAL CARE-no Performed by: Daleen Bo  Nursing Notes Reviewed/ Care Coordinated Applicable Imaging Reviewed Interpretation of Laboratory Data incorporated into ED treatment  The patient appears reasonably screened and/or stabilized for discharge and I doubt any other medical condition or other [facility] requiring further screening, evaluation,  or treatment in the ED at this time prior to discharge.  Plan: Home Medications-continue usual, use Colace, twice a day if needed to help with stooling; Home Treatments-gradual advance diet and activity; return here if the recommended treatment, does not improve the symptoms; Recommended follow up-ECP follow-up for further care and treatment and repeat blood testing as scheduled within the week.     Final Clinical Impression(s) / ED Diagnoses Final diagnoses:  Constipation, unspecified constipation type    Rx / DC Orders ED Discharge Orders    None       Daleen Bo, MD 12/30/20 [phone]

## 2020-12-30 NOTE — ED Triage Notes (Signed)
Arrived POV. C/o headache, blurry vision and upper right quadrant abdominal pain that started last night. Denies N/V/D.

## 2020-12-30 NOTE — Discharge Instructions (Addendum)
Follow-up with your primary care doctor at the scheduled appointment.  To help constipation try to drink 1 to 2 L of water every day, increase the fiber in your diet.  Also you can use Colace, 100 mg, twice a day if needed to help promote stooling.  Your hemoglobin is low but improving from discharge.  You appear to have iron deficiency anemia.  When you see your doctor asked him about taking iron to treat the anemia.

## 2021-03-01 ENCOUNTER — Emergency Department (HOSPITAL_COMMUNITY)
Admission: EM | Admit: 2021-03-01 | Discharge: 2021-03-01 | Disposition: A | Discharge status: Home / Self Care | Payer: Medicaid Other | Attending: Emergency Medicine | Admitting: Emergency Medicine

## 2021-03-01 ENCOUNTER — Emergency Department (HOSPITAL_COMMUNITY): Payer: Medicaid Other

## 2021-03-01 ENCOUNTER — Emergency Department (HOSPITAL_COMMUNITY)
Admission: EM | Admit: 2021-03-01 | Discharge: 2021-03-01 | Discharge status: Against Medical Advice | Payer: Medicaid Other

## 2021-03-01 ENCOUNTER — Encounter (HOSPITAL_COMMUNITY): Payer: Self-pay

## 2021-03-01 ENCOUNTER — Other Ambulatory Visit: Payer: Self-pay

## 2021-03-01 DIAGNOSIS — R197 Diarrhea, unspecified: Secondary | ICD-10-CM | POA: Insufficient documentation

## 2021-03-01 DIAGNOSIS — Z79899 Other long term (current) drug therapy: Secondary | ICD-10-CM | POA: Insufficient documentation

## 2021-03-01 DIAGNOSIS — R1010 Upper abdominal pain, unspecified: Secondary | ICD-10-CM

## 2021-03-01 DIAGNOSIS — K760 Fatty (change of) liver, not elsewhere classified: Secondary | ICD-10-CM | POA: Insufficient documentation

## 2021-03-01 DIAGNOSIS — R1013 Epigastric pain: Secondary | ICD-10-CM | POA: Diagnosis present

## 2021-03-01 DIAGNOSIS — K8020 Calculus of gallbladder without cholecystitis without obstruction: Secondary | ICD-10-CM | POA: Insufficient documentation

## 2021-03-01 DIAGNOSIS — R1011 Right upper quadrant pain: Secondary | ICD-10-CM

## 2021-03-01 LAB — URINALYSIS, ROUTINE W REFLEX MICROSCOPIC
Bacteria, UA: NONE SEEN
Bilirubin Urine: NEGATIVE
Glucose, UA: NEGATIVE mg/dL
Hgb urine dipstick: NEGATIVE
Ketones, ur: NEGATIVE mg/dL
Nitrite: NEGATIVE
Protein, ur: NEGATIVE mg/dL
Specific Gravity, Urine: 1.004 — ABNORMAL LOW (ref 1.005–1.030)
pH: 7 (ref 5.0–8.0)

## 2021-03-01 LAB — COMPREHENSIVE METABOLIC PANEL
ALT: 17 U/L (ref 0–44)
AST: 17 U/L (ref 15–41)
Albumin: 4.2 g/dL (ref 3.5–5.0)
Alkaline Phosphatase: 53 U/L (ref 38–126)
Anion gap: 8 (ref 5–15)
BUN: 8 mg/dL (ref 6–20)
CO2: 26 mmol/L (ref 22–32)
Calcium: 9.4 mg/dL (ref 8.9–10.3)
Chloride: 106 mmol/L (ref 98–111)
Creatinine, Ser: 0.53 mg/dL (ref 0.44–1.00)
GFR, Estimated: >60 mL/min (ref >60)
Glucose, Bld: 117 mg/dL — ABNORMAL HIGH (ref 70–99)
Potassium: 4 mmol/L (ref 3.5–5.1)
Sodium: 140 mmol/L (ref 135–145)
Total Bilirubin: 0.5 mg/dL (ref 0.3–1.2)
Total Protein: 8.1 g/dL (ref 6.5–8.1)

## 2021-03-01 LAB — CBC
HCT: 34.3 % — ABNORMAL LOW (ref 36.0–46.0)
Hemoglobin: 9.9 g/dL — ABNORMAL LOW (ref 12.0–15.0)
MCH: 23.3 pg — ABNORMAL LOW (ref 26.0–34.0)
MCHC: 28.9 g/dL — ABNORMAL LOW (ref 30.0–36.0)
MCV: 80.9 fL (ref 80.0–100.0)
Platelets: 403 10*3/uL — ABNORMAL HIGH (ref 150–400)
RBC: 4.24 MIL/uL (ref 3.87–5.11)
RDW: 24.1 % — ABNORMAL HIGH (ref 11.5–15.5)
WBC: 8.9 10*3/uL (ref 4.0–10.5)
nRBC: 0 % (ref 0.0–0.2)

## 2021-03-01 LAB — I-STAT BETA HCG BLOOD, ED (MC, WL, AP ONLY): I-stat hCG, quantitative: <5 m[IU]/mL (ref <5)

## 2021-03-01 LAB — LIPASE, BLOOD: Lipase: 23 U/L (ref 11–51)

## 2021-03-01 MED ORDER — FENTANYL CITRATE (PF) 100 MCG/2ML IJ SOLN
50.0000 ug | Freq: Once | INTRAMUSCULAR | Status: AC
Start: 2021-03-01 — End: 2021-03-01
  Administered 2021-03-01: 50 ug via INTRAVENOUS
  Filled 2021-03-01: qty 2

## 2021-03-01 MED ORDER — SODIUM CHLORIDE 0.9 % IV BOLUS
1000.0000 mL | Freq: Once | INTRAVENOUS | Status: AC
  Administered 2021-03-01: 1000 mL via INTRAVENOUS

## 2021-03-01 MED ORDER — OMEPRAZOLE 20 MG PO CPDR: 20.0000 mg | DELAYED_RELEASE_CAPSULE | Freq: Every day | ORAL | 0 refills | Status: DC

## 2021-03-01 MED ORDER — ONDANSETRON HCL 4 MG/2ML IJ SOLN
4.0000 mg | Freq: Once | INTRAMUSCULAR | Status: AC
  Administered 2021-03-01: 4 mg via INTRAVENOUS
  Filled 2021-03-01: qty 2

## 2021-03-01 MED ORDER — ONDANSETRON HCL 4 MG PO TABS: 4.0000 mg | ORAL_TABLET | Freq: Four times a day (QID) | ORAL | 0 refills | Status: AC

## 2021-03-01 NOTE — ED Notes (Signed)
Called x2 and unable to locate in lobby

## 2021-03-01 NOTE — ED Triage Notes (Signed)
Patient c/o upper and mid abdominal pain, n/v/D since 0600 today. Patient was at [facility] ED ,but left due to wait times.

## 2021-03-01 NOTE — ED Notes (Signed)
Pt given water for PO challenge 

## 2021-03-01 NOTE — ED Notes (Signed)
Called patient again, no response x3

## 2021-03-01 NOTE — Discharge Instructions (Addendum)
Your laboratory results on today's visit were within normal limits.  The ultrasound of your gallbladder showed some stones present in your gallbladder but no obstruction at this time.  You were given a referral for [place] [facility], in order to obtain further follow-up if pain reoccurs.

## 2021-03-01 NOTE — ED Provider Notes (Signed)
[facility] DE[facility] Provider Note   CSN: [id number] Arrival date & time: 03/01/21  1122     History Chief Complaint  Patient presents with   Abdominal Pain   Emesis   Diarrhea    Sandra Macdonald is a 25 y.o. female.  25 y.o female with a PMH of G6PD, Psychosis presents to the ED with a chief complaint of upper abdominal pain x 12 hours. She describes the pain as consistent aching pain along the epigastric region without any radiation. Similar episode in the past and evaluated in the ED but no diagnosis was established. She reports not taking any medication for pain control. Last bowel movement 1 hour ago without any blood in her stool. LMP last month earlier in the month. No Past surgical hx to her abdomen, no urinary symptoms, no hematemesis.    The history is provided by the patient and medical records.  Abdominal Pain Pain location:  Epigastric Pain quality: aching   Pain radiates to:  Does not radiate Pain severity:  Severe Onset quality:  Gradual Duration:  12 hours Timing:  Constant Progression:  Worsening Chronicity:  Recurrent Context: not alcohol use, not diet changes, not recent sexual activity and not sick contacts   Worsened by:  Vomiting and deep breathing Ineffective treatments:  None tried Associated symptoms: diarrhea and vomiting   Associated symptoms: no chest pain, no chills, no fever and no shortness of breath   Risk factors: no alcohol abuse and no aspirin use   Emesis Severity:  Mild Number of daily episodes:  6 Worsened by:  Nothing Associated symptoms: abdominal pain and diarrhea   Associated symptoms: no chills and no fever   Risk factors: no alcohol use, no diabetes, no sick contacts and no suspect food intake   Diarrhea Quality:  Watery Associated symptoms: abdominal pain and vomiting   Associated symptoms: no chills and no fever       Past Medical History:  Diagnosis Date   Depression    Fibroid    G6PD  deficiency    Supervision of normal first pregnancy, antepartum 02/18/2018    Nursing Staff Provider Office Location  [facility] Dating  LMP/6 week [place]  Language   English  Anatomy US   Flu Vaccine  n/a Genetic Screen  NIPS: low risk, female  AFP:   TDaP vaccine    Hgb A1C or  GTT Early  Third trimester  Rhogam     LAB RESULTS  Feeding Plan Breast Blood Type A/Positive/-- (07/15 0942)  Contraception  Antibody Negative (07/15 0942) Circumcision  Rubella 3.00 (07/15 0942) Pediatricia    Patient Active Problem List   Diagnosis Date Noted   History of command hallucinations    Iron deficiency anemia 12/28/2020   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   Psychosis ([place]) 12/03/2019   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   Incompetent cervix in pregnancy, antepartum, 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   Vaginal bleeding before [redacted] weeks gestation 03/20/2018   SAB (spontaneous abortion) 03/20/2018   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   G6PD deficiency anemia ([place]) 01/14/2018   Fibroids 12/30/2017    Past Surgical History:  Procedure Laterality Date   NO PAST SURGERIES       OB History     Gravida  2   Para      Term      Preterm      AB  2   Living         SAB  2  IAB      Ectopic      Multiple      Live Births              Family History  Problem Relation Age of Onset   Mental illness Mother    ADD / ADHD Neg Hx    Anxiety disorder Neg Hx    Alcohol abuse Neg Hx    Arthritis Neg Hx    Asthma Neg Hx    Cancer Neg Hx    Birth defects Neg Hx    Depression Neg Hx    COPD Neg Hx    Diabetes Neg Hx    Drug abuse Neg Hx    Early death Neg Hx    Hearing loss Neg Hx    Heart disease Neg Hx    Hyperlipidemia Neg Hx    Hypertension Neg Hx    Intellectual disability Neg Hx    Kidney disease Neg Hx    Learning disabilities Neg Hx    Miscarriages / Stillbirths Neg Hx    Stroke Neg Hx    Obesity Neg Hx    Vision loss Neg Hx    Varicose Veins Neg Hx      Social History   Tobacco Use   Smoking status: Never   Smokeless tobacco: Never  Vaping Use   Vaping Use: Never used  Substance Use Topics   Alcohol use: Not Currently    Alcohol/week: 1.0 standard drink    Types: 1 Standard drinks or equivalent per week   Drug use: Not Currently    Frequency: 14.0 times per week    Types: Marijuana    Comment: pt reports 1-2 blunts on work days, more on days off    Home Medications Prior to Admission medications   Medication Sig Start Date End Date Taking? Authorizing Provider  omeprazole (PRILOSEC) 20 MG capsule Take 1 capsule (20 mg total) by mouth daily for 7 days. 03/01/21 03/08/21 Yes Nickolaos Brallier, PA-C  ondansetron (ZOFRAN) 4 MG tablet Take 1 tablet (4 mg total) by mouth every 6 (six) hours for 7 days. 03/01/21 03/08/21 Yes Myria Steenbergen, Beverley Fiedler, PA-C  albuterol (VENTOLIN HFA) 108 (90 Base) MCG/ACT inhaler Inhale 1 puff into the lungs every 6 (six) hours as needed for wheezing or shortness of breath. 05/17/20   Chase Picket, MD  FLUoxetine (PROZAC) 10 MG capsule Take 1 capsule (10 mg total) by mouth daily. 12/28/20   Freida Busman, MD  iron polysaccharides (NIFEREX) 150 MG capsule Take 1 capsule (150 mg total) by mouth daily. 12/29/20   Armando Reichert, MD  OLANZapine (ZYPREXA) 2.5 MG tablet Take 1 tablet (2.5 mg total) by mouth at bedtime. 12/29/20   Armando Reichert, MD  polyethylene glycol (MIRALAX / GLYCOLAX) 17 g packet Take 17 g by mouth daily as needed for mild constipation. 12/29/20   Armando Reichert, MD  triamcinolone (KENALOG) 0.1 % Apply 1 application topically 2 (two) times daily. 09/21/20   Margarita Mail, PA-C    Allergies    Bactrim [sulfamethoxazole-trimethoprim], Blue dyes (parenteral), Codeine, Ibuprofen, Other, and Penicillins  Review of Systems   Review of Systems  Constitutional:  Negative for chills and fever.  Respiratory:  Negative for shortness of breath.   Cardiovascular:  Negative for chest pain.  Gastrointestinal:  Positive  for abdominal pain, diarrhea and vomiting.  Genitourinary:  Negative for flank pain.  Neurological:  Negative for dizziness, facial asymmetry and weakness.  All other systems reviewed and are negative.  Physical Exam Updated Vital Signs BP 127/81   Pulse 92   Temp 98 F (36.7 C)   Resp 17   Ht '5\' 5"'$  (1.651 m)   Wt 106.6 kg   LMP 01/30/2021 (Approximate)   SpO2 100%   BMI 39.12 kg/m   Physical Exam Vitals and nursing note reviewed.  Constitutional:      General: She is not in acute distress.    Appearance: She is well-developed.  HENT:     Head: Normocephalic and atraumatic.  Eyes:     Conjunctiva/sclera: Conjunctivae normal.  Cardiovascular:     Rate and Rhythm: Normal rate and regular rhythm.     Heart sounds: No murmur heard. Pulmonary:     Effort: Pulmonary effort is normal. No respiratory distress.     Breath sounds: Normal breath sounds.  Abdominal:     Palpations: Abdomen is soft.     Tenderness: There is abdominal tenderness in the right upper quadrant. There is no right CVA tenderness or left CVA tenderness.  Musculoskeletal:     Cervical back: Neck supple.  Skin:    General: Skin is warm and dry.  Neurological:     Mental Status: She is alert.    ED Results / Procedures / Treatments   Labs (all labs ordered are listed, but only abnormal results are displayed) Labs Reviewed  COMPREHENSIVE METABOLIC PANEL - Abnormal; Notable for the following components:      Result Value   Glucose, Bld 117 (*)    All other components within normal limits  CBC - Abnormal; Notable for the following components:   Hemoglobin 9.9 (*)    HCT 34.3 (*)    MCH 23.3 (*)    MCHC 28.9 (*)    RDW 24.1 (*)    Platelets 403 (*)    All other components within normal limits  LIPASE, BLOOD  URINALYSIS, ROUTINE W REFLEX MICROSCOPIC  I-STAT BETA HCG BLOOD, ED ([facility], [facility], [facility] ONLY)    EKG None  Radiology US Abdomen Limited RUQ (LIVER/GB)  Result Date: 03/01/2021 CLINICAL DATA:   25 year old female with right upper quadrant abdominal pain EXAM: ULTRASOUND ABDOMEN LIMITED RIGHT UPPER QUADRANT COMPARISON:  None. FINDINGS: Gallbladder: There are multiple stones within the gallbladder. No gallbladder wall thickening or pericholecystic fluid. Negative sonographic Murphy's sign. Common bile duct: Diameter: 4 mm Liver: There is diffuse increased liver echogenicity most commonly seen in the setting of fatty infiltration. Superimposed inflammation or fibrosis is not excluded. Clinical correlation is recommended. Portal vein is patent on color Doppler imaging with normal direction of blood flow towards the liver. Other: None. IMPRESSION: 1. Cholelithiasis without sonographic evidence of acute cholecystitis. 2. Fatty liver. Electronically Signed   By: Anner Crete M.D.   On: 03/01/2021 20:34    Procedures Procedures   Medications Ordered in ED Medications  fentaNYL (SUBLIMAZE) injection 50 mcg (50 mcg Intravenous Given 03/01/21 1950)  sodium chloride 0.9 % bolus 1,000 mL (0 mLs Intravenous Stopped 03/01/21 2146)  ondansetron (ZOFRAN) injection 4 mg (4 mg Intravenous Given 03/01/21 1949)  fentaNYL (SUBLIMAZE) injection 50 mcg (50 mcg Intravenous Given 03/01/21 2153)    ED Course  I have reviewed the triage vital signs and the nursing notes.  Pertinent labs & imaging results that were available during my care of the patient were reviewed by me and considered in my medical decision making (see chart for details).  Clinical Course as of 03/01/21 2207  Tue Mar 01, 2021  1903 I-stat hCG, quantitative: <  5.0 [JS]    Clinical Course User Index [JS] Janeece Fitting, PA-C   MDM Rules/Calculators/A&P    Patient presents to the ED with recurrent epigastric pain that has been ongoing for the past 12 hours.  Similar episode 2 months ago, evaluated in the ED for constipation.  However, reports on today's visit she has been able to have a bowel movement.  Reports the pain is aching in nature  mostly along the epigastric and right upper quadrant region.  Exacerbated with movement and deep inspiration, has not taken any medication for improvement in symptoms.  Interpretation of her lab work revealed a CBC with no leukocytosis, hemoglobin is slightly decreased however it is better than her previous baselines.  Lipase level is within normal limits.  hCG is negative.  CMP without any electrolyte derangement, current levels within normal limits.  LFTs are unremarkable. [place] of her abdomen revealed: 1. Cholelithiasis without sonographic evidence of acute  cholecystitis.  2. Fatty liver.      10:04 PM results were discussed at length with patient.  We did discuss placing her on a PPI, in hopes to help with some of the right upper quadrant pain.  She also go home on a short course of nausea medication with help with symptomatic control.   She is tolerating p.o. adequately, vitals are otherwise within normal limits, afebrile.  Return precautions discussed at length.  Patient stable for discharge.   Portions of this note were generated with [employer]. Dictation errors may occur despite best attempts at proofreading.  Final Clinical Impression(s) / ED Diagnoses Final diagnoses:  Pain of upper abdomen    Rx / DC Orders ED Discharge Orders          Ordered    ondansetron (ZOFRAN) 4 MG tablet  Every 6 hours        03/01/21 2207    omeprazole (PRILOSEC) 20 MG capsule  Daily        03/01/21 2207             Janeece Fitting, PA-C 03/01/21 2207    Drenda Freeze, MD 03/01/21 [phone]

## 2021-03-06 ENCOUNTER — Emergency Department (HOSPITAL_COMMUNITY): Payer: Medicaid Other

## 2021-03-06 ENCOUNTER — Other Ambulatory Visit: Payer: Self-pay

## 2021-03-06 ENCOUNTER — Encounter (HOSPITAL_COMMUNITY): Payer: Self-pay

## 2021-03-06 ENCOUNTER — Emergency Department (HOSPITAL_COMMUNITY)
Admission: EM | Admit: 2021-03-06 | Discharge: 2021-03-06 | Disposition: A | Discharge status: Home / Self Care | Payer: Medicaid Other | Attending: Emergency Medicine | Admitting: Emergency Medicine

## 2021-03-06 DIAGNOSIS — K8020 Calculus of gallbladder without cholecystitis without obstruction: Secondary | ICD-10-CM | POA: Insufficient documentation

## 2021-03-06 DIAGNOSIS — R1011 Right upper quadrant pain: Secondary | ICD-10-CM | POA: Diagnosis present

## 2021-03-06 DIAGNOSIS — K8050 Calculus of bile duct without cholangitis or cholecystitis without obstruction: Secondary | ICD-10-CM

## 2021-03-06 LAB — COMPREHENSIVE METABOLIC PANEL
ALT: 22 U/L (ref 0–44)
AST: 19 U/L (ref 15–41)
Albumin: 3.8 g/dL (ref 3.5–5.0)
Alkaline Phosphatase: 48 U/L (ref 38–126)
Anion gap: 7 (ref 5–15)
BUN: 13 mg/dL (ref 6–20)
CO2: 26 mmol/L (ref 22–32)
Calcium: 8.8 mg/dL — ABNORMAL LOW (ref 8.9–10.3)
Chloride: 105 mmol/L (ref 98–111)
Creatinine, Ser: 0.82 mg/dL (ref 0.44–1.00)
GFR, Estimated: >60 mL/min (ref >60)
Glucose, Bld: 93 mg/dL (ref 70–99)
Potassium: 3 mmol/L — ABNORMAL LOW (ref 3.5–5.1)
Sodium: 138 mmol/L (ref 135–145)
Total Bilirubin: 0.6 mg/dL (ref 0.3–1.2)
Total Protein: 7.9 g/dL (ref 6.5–8.1)

## 2021-03-06 LAB — CBC WITH DIFFERENTIAL/PLATELET
Abs Immature Granulocytes: 0.03 10*3/uL (ref 0.00–0.07)
Basophils Absolute: 0 10*3/uL (ref 0.0–0.1)
Basophils Relative: 0 %
Eosinophils Absolute: 0.1 10*3/uL (ref 0.0–0.5)
Eosinophils Relative: 1 %
HCT: 30.9 % — ABNORMAL LOW (ref 36.0–46.0)
Hemoglobin: 9.2 g/dL — ABNORMAL LOW (ref 12.0–15.0)
Immature Granulocytes: 0 %
Lymphocytes Relative: 24 %
Lymphs Abs: 2 10*3/uL (ref 0.7–4.0)
MCH: 23.9 pg — ABNORMAL LOW (ref 26.0–34.0)
MCHC: 29.8 g/dL — ABNORMAL LOW (ref 30.0–36.0)
MCV: 80.3 fL (ref 80.0–100.0)
Monocytes Absolute: 0.7 10*3/uL (ref 0.1–1.0)
Monocytes Relative: 9 %
Neutro Abs: 5.5 10*3/uL (ref 1.7–7.7)
Neutrophils Relative %: 66 %
Platelets: 426 10*3/uL — ABNORMAL HIGH (ref 150–400)
RBC: 3.85 MIL/uL — ABNORMAL LOW (ref 3.87–5.11)
RDW: 21.4 % — ABNORMAL HIGH (ref 11.5–15.5)
WBC: 8.3 10*3/uL (ref 4.0–10.5)
nRBC: 0 % (ref 0.0–0.2)

## 2021-03-06 LAB — URINALYSIS, ROUTINE W REFLEX MICROSCOPIC
Bacteria, UA: NONE SEEN
Bilirubin Urine: NEGATIVE
Glucose, UA: NEGATIVE mg/dL
Ketones, ur: 20 mg/dL — AB
Leukocytes,Ua: NEGATIVE
Nitrite: NEGATIVE
Protein, ur: NEGATIVE mg/dL
Specific Gravity, Urine: 1.025 (ref 1.005–1.030)
pH: 6 (ref 5.0–8.0)

## 2021-03-06 LAB — LIPASE, BLOOD: Lipase: 22 U/L (ref 11–51)

## 2021-03-06 LAB — I-STAT BETA HCG BLOOD, ED (MC, WL, AP ONLY): I-stat hCG, quantitative: <5 m[IU]/mL (ref <5)

## 2021-03-06 LAB — TROPONIN I (HIGH SENSITIVITY)
Troponin I (High Sensitivity): <2 ng/L (ref <18)
Troponin I (High Sensitivity): <2 ng/L (ref <18)

## 2021-03-06 MED ORDER — FENTANYL CITRATE (PF) 100 MCG/2ML IJ SOLN
50.0000 ug | Freq: Once | INTRAMUSCULAR | Status: AC
  Administered 2021-03-06: 50 ug via INTRAVENOUS
  Filled 2021-03-06: qty 2

## 2021-03-06 MED ORDER — ONDANSETRON HCL 4 MG/2ML IJ SOLN
4.0000 mg | Freq: Once | INTRAMUSCULAR | Status: AC
  Administered 2021-03-06: 4 mg via INTRAVENOUS
  Filled 2021-03-06: qty 2

## 2021-03-06 MED ORDER — FENTANYL CITRATE (PF) 100 MCG/2ML IJ SOLN
100.0000 ug | Freq: Once | INTRAMUSCULAR | Status: AC
  Administered 2021-03-06: 100 ug via INTRAVENOUS
  Filled 2021-03-06: qty 2

## 2021-03-06 MED ORDER — HYDROMORPHONE HCL 1 MG/ML IJ SOLN
0.5000 mg | Freq: Once | INTRAMUSCULAR | Status: AC
  Administered 2021-03-06: 0.5 mg via INTRAVENOUS
  Filled 2021-03-06: qty 1

## 2021-03-06 NOTE — Discharge Instructions (Addendum)
You are seen in the ER today for your belly pain.  Your work-up was reassuring again today.  You do have gallstones which are likely because of your pain with spasms of her gallbladder.  Your case was discussed with the surgeon who recommends you follow-up in her office this week.  Her name is Dr. Marcello Moores she is listed below.  Please call her office first thing tomorrow to schedule follow-up appointment.  You have been prescribed nausea medication to take as needed and pain medication to help in your pain is most severe.  You may alternate this with ibuprofen.  Return to the ER with any severe worsening of your abdominal pain, nausea or vomiting does not stop, fevers or chills, or any new severe symptoms.

## 2021-03-06 NOTE — ED Triage Notes (Signed)
Right side abdominal pain that started about 4 hours ago. Vomiting as well. History of same but this is the worst it has been.

## 2021-03-06 NOTE — ED Provider Notes (Signed)
[facility] DEPT Provider Note   CSN: [id number] Arrival date & time: 03/06/21  [id number]     History Chief Complaint  Patient presents with   Abdominal Pain    Sandra Macdonald is a 25 y.o. female who presents with recurring right-sided abdominal pain that started about 4 hours prior to arrival when she woke up middle night at 4 AM secondary to pain.  Due to patient's associated NBNB emesis.  History of the same but states it is worse today.  She was just recently seen in the emergency department on 7/26 for similar symptoms at that time had normal LFTs and right upper quadrant ultrasound that revealed cholelithiasis without evidence of cholecystitis.  I personally reviewed this patient's medical records.  She has has history of iron deficiency anemia, not taking any medications every day.  HPI     Past Medical History:  Diagnosis Date   Depression    Fibroid    G6PD deficiency    Supervision of normal first pregnancy, antepartum 02/18/2018    Nursing Staff Provider Office Location  [facility] Dating  LMP/6 week [place]  Language   English  Anatomy US   Flu Vaccine  n/a Genetic Screen  NIPS: low risk, female  AFP:   TDaP vaccine    Hgb A1C or  GTT Early  Third trimester  Rhogam     LAB RESULTS  Feeding Plan Breast Blood Type A/Positive/-- (07/15 0942)  Contraception  Antibody Negative (07/15 0942) Circumcision  Rubella 3.00 (07/15 0942) Pediatricia    Patient Active Problem List   Diagnosis Date Noted   History of command hallucinations    Iron deficiency anemia 12/28/2020   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   Psychosis ([place]) 12/03/2019   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   Incompetent cervix in pregnancy, antepartum, 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   Vaginal bleeding before [redacted] weeks gestation 03/20/2018   SAB (spontaneous abortion) 03/20/2018   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   G6PD deficiency anemia ([place]) 01/14/2018   Fibroids 12/30/2017     Past Surgical History:  Procedure Laterality Date   NO PAST SURGERIES       OB History     Gravida  2   Para      Term      Preterm      AB  2   Living         SAB  2   IAB      Ectopic      Multiple      Live Births              Family History  Problem Relation Age of Onset   Mental illness Mother    ADD / ADHD Neg Hx    Anxiety disorder Neg Hx    Alcohol abuse Neg Hx    Arthritis Neg Hx    Asthma Neg Hx    Cancer Neg Hx    Birth defects Neg Hx    Depression Neg Hx    COPD Neg Hx    Diabetes Neg Hx    Drug abuse Neg Hx    Early death Neg Hx    Hearing loss Neg Hx    Heart disease Neg Hx    Hyperlipidemia Neg Hx    Hypertension Neg Hx    Intellectual disability Neg Hx    Kidney disease Neg Hx    Learning disabilities Neg Hx    Miscarriages / Stillbirths Neg Hx  Stroke Neg Hx    Obesity Neg Hx    Vision loss Neg Hx    Varicose Veins Neg Hx     Social History   Tobacco Use   Smoking status: Never   Smokeless tobacco: Never  Vaping Use   Vaping Use: Never used  Substance Use Topics   Alcohol use: Not Currently    Alcohol/week: 1.0 standard drink    Types: 1 Standard drinks or equivalent per week   Drug use: Not Currently    Frequency: 14.0 times per week    Types: Marijuana    Comment: pt reports 1-2 blunts on work days, more on days off    Home Medications Prior to Admission medications   Medication Sig Start Date End Date Taking? Authorizing Provider  FLUoxetine (PROZAC) 10 MG capsule Take 1 capsule (10 mg total) by mouth daily. Patient not taking: Reported on 03/06/2021 12/28/20   Freida Busman, MD  iron polysaccharides (NIFEREX) 150 MG capsule Take 1 capsule (150 mg total) by mouth daily. Patient not taking: Reported on 03/06/2021 12/29/20   Armando Reichert, MD  OLANZapine (ZYPREXA) 2.5 MG tablet Take 1 tablet (2.5 mg total) by mouth at bedtime. Patient not taking: Reported on 03/06/2021 12/29/20   Armando Reichert, MD   omeprazole (PRILOSEC) 20 MG capsule Take 1 capsule (20 mg total) by mouth daily for 7 days. Patient not taking: Reported on 03/06/2021 03/01/21 03/08/21  Janeece Fitting, PA-C  ondansetron (ZOFRAN) 4 MG tablet Take 1 tablet (4 mg total) by mouth every 6 (six) hours for 7 days. Patient not taking: Reported on 03/06/2021 03/01/21 03/08/21  Janeece Fitting, PA-C  polyethylene glycol (MIRALAX / GLYCOLAX) 17 g packet Take 17 g by mouth daily as needed for mild constipation. Patient not taking: Reported on 03/06/2021 12/29/20   Armando Reichert, MD  triamcinolone (KENALOG) 0.1 % Apply 1 application topically 2 (two) times daily. Patient not taking: Reported on 03/06/2021 09/21/20   Margarita Mail, PA-C    Allergies    Bactrim [sulfamethoxazole-trimethoprim], Blue dyes (parenteral), Codeine, Ibuprofen, Other, and Penicillins  Review of Systems   Review of Systems  Constitutional:  Positive for appetite change. Negative for activity change, chills, diaphoresis, fatigue and fever.  HENT: Negative.    Respiratory: Negative.    Cardiovascular: Negative.   Gastrointestinal:  Positive for abdominal pain and vomiting. Negative for blood in stool, constipation and diarrhea.       Denies melena or hematochezia  Genitourinary: Negative.   Musculoskeletal: Negative.   Skin: Negative.   Neurological: Negative.    Physical Exam Updated Vital Signs BP 128/82   Pulse 71   Temp 97.9 F (36.6 C) (Oral)   Resp 19   Ht '5\' 5"'$  (1.651 m)   Wt 106.6 kg   LMP 03/02/2021   SpO2 100%   BMI 39.11 kg/m   Physical Exam Vitals and nursing note reviewed.  Constitutional:      Appearance: She is obese.  HENT:     Head: Normocephalic and atraumatic.     Nose: Nose normal.     Mouth/Throat:     Mouth: Mucous membranes are moist.     Pharynx: Oropharynx is clear. Uvula midline. No oropharyngeal exudate or posterior oropharyngeal erythema.     Tonsils: No tonsillar exudate.  Eyes:     General:        Right eye: No  discharge.        Left eye: No discharge.     Conjunctiva/sclera: Conjunctivae  normal.  Neck:     Trachea: Trachea and phonation normal.  Cardiovascular:     Rate and Rhythm: Normal rate and regular rhythm.     Pulses: Normal pulses.     Heart sounds: Normal heart sounds. No murmur heard. Pulmonary:     Effort: Pulmonary effort is normal. No tachypnea, bradypnea, accessory muscle usage, prolonged expiration or respiratory distress.     Breath sounds: Normal breath sounds. No wheezing or rales.  Chest:     Chest wall: No mass, lacerations, deformity, swelling, tenderness or crepitus.  Abdominal:     General: Bowel sounds are normal. There is no distension.     Palpations: Abdomen is soft.     Tenderness: There is abdominal tenderness in the right upper quadrant. There is no right CVA tenderness or left CVA tenderness. Positive signs include Murphy's sign. Negative signs include Rovsing's sign and McBurney's sign.     Hernia: No hernia is present.  Musculoskeletal:        General: No deformity.     Cervical back: Normal range of motion and neck supple. No edema, rigidity or crepitus. No pain with movement, spinous process tenderness or muscular tenderness.  Skin:    General: Skin is warm and dry.     Capillary Refill: Capillary refill takes less than 2 seconds.  Neurological:     General: No focal deficit present.     Mental Status: She is alert and oriented to person, place, and time. Mental status is at baseline.  Psychiatric:        Mood and Affect: Mood normal.    ED Results / Procedures / Treatments   Labs (all labs ordered are listed, but only abnormal results are displayed) Labs Reviewed  COMPREHENSIVE METABOLIC PANEL - Abnormal; Notable for the following components:      Result Value   Potassium 3.0 (*)    Calcium 8.8 (*)    All other components within normal limits  CBC WITH DIFFERENTIAL/PLATELET - Abnormal; Notable for the following components:   RBC 3.85 (*)     Hemoglobin 9.2 (*)    HCT 30.9 (*)    MCH 23.9 (*)    MCHC 29.8 (*)    RDW 21.4 (*)    Platelets 426 (*)    All other components within normal limits  URINALYSIS, ROUTINE W REFLEX MICROSCOPIC - Abnormal; Notable for the following components:   Hgb urine dipstick MODERATE (*)    Ketones, ur 20 (*)    All other components within normal limits  LIPASE, BLOOD  I-STAT BETA HCG BLOOD, ED ([facility], [facility], [facility] ONLY)  TROPONIN I (HIGH SENSITIVITY)  TROPONIN I (HIGH SENSITIVITY)    EKG EKG Interpretation  Date/Time:  Sunday March 06 2021 08:07:32 EDT Ventricular Rate:  75 PR Interval:  146 QRS Duration: 93 QT Interval:  396 QTC Calculation: 443 R Axis:   58 Text Interpretation: Sinus arrhythmia since last tracing no significant change Confirmed by Daleen Bo [phone]) on 03/06/2021 1:50:25 PM  Radiology DG Chest Portable 1 View  Result Date: 03/06/2021 CLINICAL DATA:  Chest pain. EXAM: PORTABLE CHEST 1 VIEW COMPARISON:  None. FINDINGS: The heart size and mediastinal contours are within normal limits. Both lungs are clear. The visualized skeletal structures are unremarkable. IMPRESSION: No active disease. Electronically Signed   By: Dorise Bullion III M.D   On: 03/06/2021 11:19   US Abdomen Limited RUQ (LIVER/GB)  Result Date: 03/06/2021 CLINICAL DATA:  Right upper quadrant pain. EXAM: ULTRASOUND ABDOMEN  LIMITED RIGHT UPPER QUADRANT COMPARISON:  March 01, 2021 FINDINGS: Gallbladder: Cholelithiasis. No wall thickening, pericholecystic fluid, or Murphy's sign. Common bile duct: Diameter: 5 mm Liver: Increased echogenicity with no focal mass. Portal vein is patent on color Doppler imaging with normal direction of blood flow towards the liver. Other: None. IMPRESSION: 1. Cholelithiasis. No wall thickening, pericholecystic fluid, or Murphy's sign. 2. Increased echogenicity in the liver is nonspecific but often due to hepatic steatosis. Electronically Signed   By: Dorise Bullion III M.D   On: 03/06/2021  09:38    Procedures Procedures   Medications Ordered in ED Medications  HYDROmorphone (DILAUDID) injection 0.5 mg (has no administration in time range)  fentaNYL (SUBLIMAZE) injection 50 mcg (50 mcg Intravenous Given 03/06/21 0839)  ondansetron (ZOFRAN) injection 4 mg (4 mg Intravenous Given 03/06/21 0839)  fentaNYL (SUBLIMAZE) injection 100 mcg (100 mcg Intravenous Given 03/06/21 1010)  HYDROmorphone (DILAUDID) injection 0.5 mg (0.5 mg Intravenous Given 03/06/21 1149)  ondansetron (ZOFRAN) injection 4 mg (4 mg Intravenous Given 03/06/21 1149)    ED Course  I have reviewed the triage vital signs and the nursing notes.  Pertinent labs & imaging results that were available during my care of the patient were reviewed by me and considered in my medical decision making (see chart for details).  Clinical Course as of 03/06/21 1354  Sun Mar 06, 2021  1102 Case discussed with Dr. Marcello Moores, general surgeon, who recommends outpatient follow-up in the office this week.  I appreciate her collaboration of care this patient. [RS]    Clinical Course User Index [RS] Myrle Dues, Gypsy Balsam, PA-C   MDM Rules/Calculators/A&P                         25 year old female who presents with concern for right upper quadrant pain, worsening from previous baseline.  Differential diagnosis includes but was not limited to cholecystitis, cholangitis, cholelithiasis with biliary colic, pancreatitis, hepatitis, pyelonephritis, GERD, gastritis, PUD, liver abscess, appendicitis.  Hypertensive on intake, tachypneic, vital signs otherwise normal.Cardiopulmonary exam is normal, abdominal exam is positive for Murphy's sign, otherwise unremarkable.  Patient is neurovascular intact in all 4 extremities.  Uncomfortable appearing secondary to pain.  CBC with baseline anemia of hemoglobin 9.2, CMP with hypokalemia of 3, otherwise unremarkable.  UA unremarkable, lipase is normal.  Troponin is negative.  Chest x-ray negative for  acute cardiopulmonary disease, EKG unremarkable with sinus arrhythmia without ischemic changes.  Ultrasound of the abdomen revealed cholelithiasis without evidence of cholecystitis or pericholecystic fluid.  Pain improved after multiple rounds of antiemetic and analgesia.  Consult to general surgery as above recommend outpatient follow-up.  Given patient has normal vital signs at this time and is more comfortable, do not feel any further work-up is warranted in the ED.  Will trial p.o. and discharged home with antiemetic and pain medication.  Brylie voiced understanding for medical evaluation and treatment plan.  Each of her questions answered to her expressed satisfaction.  Return precautions given.  Patient is stable and appropriate for discharge at this time.  This chart was dictated using voice recognition software, Dragon. Despite the best efforts of this provider to proofread and correct errors, errors may still occur which can change documentation meaning.   Final Clinical Impression(s) / ED Diagnoses Final diagnoses:  RUQ pain    Rx / DC Orders ED Discharge Orders     None        Emeline Darling, PA-C 03/06/21 1354  Daleen Bo, MD 03/06/21 1723

## 2021-03-29 ENCOUNTER — Ambulatory Visit: Payer: Self-pay | Admitting: Nurse Practitioner

## 2021-04-28 ENCOUNTER — Other Ambulatory Visit: Payer: Self-pay

## 2021-04-28 ENCOUNTER — Emergency Department (HOSPITAL_COMMUNITY)
Admission: EM | Admit: 2021-04-28 | Discharge: 2021-04-29 | Disposition: A | Discharge status: Home / Self Care | Payer: Medicaid Other | Attending: Emergency Medicine | Admitting: Emergency Medicine

## 2021-04-28 ENCOUNTER — Encounter (HOSPITAL_COMMUNITY): Payer: Self-pay

## 2021-04-28 DIAGNOSIS — K8020 Calculus of gallbladder without cholecystitis without obstruction: Secondary | ICD-10-CM | POA: Insufficient documentation

## 2021-04-28 DIAGNOSIS — R1011 Right upper quadrant pain: Secondary | ICD-10-CM | POA: Diagnosis present

## 2021-04-28 NOTE — ED Triage Notes (Signed)
Pt BIB EMS from home. Pt complains of upper abdominal pain. Pt states that it is her gallbladder and had recently had gallstones but was unable to follow up.   Vitals 124/72 112 20 100 room air  99 cbg

## 2021-04-28 NOTE — ED Notes (Signed)
Attempted to get blood but was unsuccessful 

## 2021-04-29 ENCOUNTER — Emergency Department (HOSPITAL_COMMUNITY): Payer: Medicaid Other

## 2021-04-29 LAB — URINALYSIS, ROUTINE W REFLEX MICROSCOPIC
Glucose, UA: NEGATIVE mg/dL
Ketones, ur: NEGATIVE mg/dL
Nitrite: NEGATIVE
Specific Gravity, Urine: 1.02 (ref 1.005–1.030)
pH: 6.5 (ref 5.0–8.0)

## 2021-04-29 LAB — CBC WITH DIFFERENTIAL/PLATELET
Abs Immature Granulocytes: 0.06 10*3/uL (ref 0.00–0.07)
Basophils Absolute: 0 10*3/uL (ref 0.0–0.1)
Basophils Relative: 0 %
Eosinophils Absolute: 0 10*3/uL (ref 0.0–0.5)
Eosinophils Relative: 0 %
HCT: 33.9 % — ABNORMAL LOW (ref 36.0–46.0)
Hemoglobin: 9.9 g/dL — ABNORMAL LOW (ref 12.0–15.0)
Immature Granulocytes: 1 %
Lymphocytes Relative: 20 %
Lymphs Abs: 2.1 10*3/uL (ref 0.7–4.0)
MCH: 23.9 pg — ABNORMAL LOW (ref 26.0–34.0)
MCHC: 29.2 g/dL — ABNORMAL LOW (ref 30.0–36.0)
MCV: 81.9 fL (ref 80.0–100.0)
Monocytes Absolute: 0.7 10*3/uL (ref 0.1–1.0)
Monocytes Relative: 6 %
Neutro Abs: 7.7 10*3/uL (ref 1.7–7.7)
Neutrophils Relative %: 73 %
Platelets: 496 10*3/uL — ABNORMAL HIGH (ref 150–400)
RBC: 4.14 MIL/uL (ref 3.87–5.11)
RDW: 15.8 % — ABNORMAL HIGH (ref 11.5–15.5)
WBC: 10.6 10*3/uL — ABNORMAL HIGH (ref 4.0–10.5)
nRBC: 0 % (ref 0.0–0.2)

## 2021-04-29 LAB — COMPREHENSIVE METABOLIC PANEL
ALT: 124 U/L — ABNORMAL HIGH (ref 0–44)
AST: 258 U/L — ABNORMAL HIGH (ref 15–41)
Albumin: 4.1 g/dL (ref 3.5–5.0)
Alkaline Phosphatase: 106 U/L (ref 38–126)
Anion gap: 13 (ref 5–15)
BUN: 8 mg/dL (ref 6–20)
CO2: 24 mmol/L (ref 22–32)
Calcium: 8.8 mg/dL — ABNORMAL LOW (ref 8.9–10.3)
Chloride: 105 mmol/L (ref 98–111)
Creatinine, Ser: 0.58 mg/dL (ref 0.44–1.00)
GFR, Estimated: >60 mL/min (ref >60)
Glucose, Bld: 106 mg/dL — ABNORMAL HIGH (ref 70–99)
Potassium: 3.4 mmol/L — ABNORMAL LOW (ref 3.5–5.1)
Sodium: 142 mmol/L (ref 135–145)
Total Bilirubin: 0.9 mg/dL (ref 0.3–1.2)
Total Protein: 8.3 g/dL — ABNORMAL HIGH (ref 6.5–8.1)

## 2021-04-29 LAB — URINALYSIS, MICROSCOPIC (REFLEX)
Bacteria, UA: NONE SEEN
RBC / HPF: >50 RBC/hpf (ref 0–5)

## 2021-04-29 LAB — PREGNANCY, URINE: Preg Test, Ur: NEGATIVE

## 2021-04-29 LAB — LIPASE, BLOOD: Lipase: 30 U/L (ref 11–51)

## 2021-04-29 MED ORDER — OXYCODONE HCL 5 MG PO TABS: 5.0000 mg | ORAL_TABLET | ORAL | 0 refills | Status: DC | PRN

## 2021-04-29 MED ORDER — FENTANYL CITRATE PF 50 MCG/ML IJ SOSY
50.0000 ug | PREFILLED_SYRINGE | Freq: Once | INTRAMUSCULAR | Status: AC
  Administered 2021-04-29: 50 ug via INTRAVENOUS
  Filled 2021-04-29: qty 1

## 2021-04-29 MED ORDER — ONDANSETRON HCL 4 MG/2ML IJ SOLN
4.0000 mg | Freq: Once | INTRAMUSCULAR | Status: AC
  Administered 2021-04-29: 4 mg via INTRAVENOUS
  Filled 2021-04-29: qty 2

## 2021-04-29 MED ORDER — ONDANSETRON 4 MG PO TBDP: 4.0000 mg | ORAL_TABLET | Freq: Three times a day (TID) | ORAL | 0 refills | Status: DC | PRN

## 2021-04-29 MED ORDER — SODIUM CHLORIDE 0.9 % IV BOLUS
1000.0000 mL | Freq: Once | INTRAVENOUS | Status: AC
  Administered 2021-04-29: 1000 mL via INTRAVENOUS

## 2021-04-29 NOTE — ED Provider Notes (Signed)
[facility] DEPT Provider Note   CSN: [id number] Arrival date & time: 04/28/21  1951     History Chief Complaint  Patient presents with   Abdominal Pain    Sandra Macdonald is a 25 y.o. female.  The history is provided by the patient and medical records.  Abdominal Pain Associated symptoms: nausea and vomiting    25 year old female with history of depression, uterine fibroids, G6PD deficiency, history of gallstones, presenting to the ED with right upper abdominal pain.  States she ate some sweets earlier today and has had persistent pain since that time.  She was diagnosed with gallstones in July and attempted to follow-up with general surgery, however states surgeon that she saw in the office that day reported to her that "she did not do gallbladders" and then was lost to follow-up afterwards.  States she did take naproxen which helped a little with pain.  She vomited x1 but still nauseated and uncomfortable here in ED.  Past Medical History:  Diagnosis Date   Depression    Fibroid    G6PD deficiency    Supervision of normal first pregnancy, antepartum 02/18/2018    Nursing Staff Provider Office Location  [facility] Dating  LMP/6 week [place]  Language   English  Anatomy US   Flu Vaccine  n/a Genetic Screen  NIPS: low risk, female  AFP:   TDaP vaccine    Hgb A1C or  GTT Early  Third trimester  Rhogam     LAB RESULTS  Feeding Plan Breast Blood Type A/Positive/-- (07/15 0942)  Contraception  Antibody Negative (07/15 0942) Circumcision  Rubella 3.00 (07/15 0942) Pediatricia    Patient Active Problem List   Diagnosis Date Noted   History of command hallucinations    Iron deficiency anemia 12/28/2020   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   Psychosis ([place]) 12/03/2019   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   Incompetent cervix in pregnancy, antepartum, 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   Vaginal bleeding before [redacted] weeks gestation 03/20/2018   SAB (spontaneous  abortion) 03/20/2018   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   G6PD deficiency anemia ([place]) 01/14/2018   Fibroids 12/30/2017    Past Surgical History:  Procedure Laterality Date   NO PAST SURGERIES       OB History     Gravida  2   Para      Term      Preterm      AB  2   Living         SAB  2   IAB      Ectopic      Multiple      Live Births              Family History  Problem Relation Age of Onset   Mental illness Mother    ADD / ADHD Neg Hx    Anxiety disorder Neg Hx    Alcohol abuse Neg Hx    Arthritis Neg Hx    Asthma Neg Hx    Cancer Neg Hx    Birth defects Neg Hx    Depression Neg Hx    COPD Neg Hx    Diabetes Neg Hx    Drug abuse Neg Hx    Early death Neg Hx    Hearing loss Neg Hx    Heart disease Neg Hx    Hyperlipidemia Neg Hx    Hypertension Neg Hx    Intellectual disability Neg Hx  Kidney disease Neg Hx    Learning disabilities Neg Hx    Miscarriages / Stillbirths Neg Hx    Stroke Neg Hx    Obesity Neg Hx    Vision loss Neg Hx    Varicose Veins Neg Hx     Social History   Tobacco Use   Smoking status: Never   Smokeless tobacco: Never  Vaping Use   Vaping Use: Never used  Substance Use Topics   Alcohol use: Not Currently    Alcohol/week: 1.0 standard drink    Types: 1 Standard drinks or equivalent per week   Drug use: Not Currently    Frequency: 14.0 times per week    Types: Marijuana    Comment: pt reports 1-2 blunts on work days, more on days off    Home Medications Prior to Admission medications   Medication Sig Start Date End Date Taking? Authorizing Provider  FLUoxetine (PROZAC) 10 MG capsule Take 1 capsule (10 mg total) by mouth daily. Patient not taking: Reported on 03/06/2021 12/28/20   Freida Busman, MD  iron polysaccharides (NIFEREX) 150 MG capsule Take 1 capsule (150 mg total) by mouth daily. Patient not taking: Reported on 03/06/2021 12/29/20   Armando Reichert, MD  OLANZapine (ZYPREXA) 2.5 MG tablet  Take 1 tablet (2.5 mg total) by mouth at bedtime. Patient not taking: Reported on 03/06/2021 12/29/20   Armando Reichert, MD  omeprazole (PRILOSEC) 20 MG capsule Take 1 capsule (20 mg total) by mouth daily for 7 days. Patient not taking: Reported on 03/06/2021 03/01/21 03/08/21  Janeece Fitting, PA-C  polyethylene glycol (MIRALAX / GLYCOLAX) 17 g packet Take 17 g by mouth daily as needed for mild constipation. Patient not taking: Reported on 03/06/2021 12/29/20   Armando Reichert, MD  triamcinolone (KENALOG) 0.1 % Apply 1 application topically 2 (two) times daily. Patient not taking: Reported on 03/06/2021 09/21/20   Margarita Mail, PA-C    Allergies    Bactrim [sulfamethoxazole-trimethoprim], Blue dyes (parenteral), Codeine, Ibuprofen, Other, and Penicillins  Review of Systems   Review of Systems  Gastrointestinal:  Positive for abdominal pain, nausea and vomiting.  All other systems reviewed and are negative.  Physical Exam Updated Vital Signs BP 124/79 (BP Location: Right Arm)   Pulse 90   Temp 98.4 F (36.9 C) (Oral)   Resp 17   SpO2 100%   Physical Exam Vitals and nursing note reviewed.  Constitutional:      Appearance: She is well-developed.  HENT:     Head: Normocephalic and atraumatic.  Eyes:     Conjunctiva/sclera: Conjunctivae normal.     Pupils: Pupils are equal, round, and reactive to light.  Cardiovascular:     Rate and Rhythm: Normal rate and regular rhythm.     Heart sounds: Normal heart sounds.  Pulmonary:     Effort: Pulmonary effort is normal.     Breath sounds: Normal breath sounds.  Abdominal:     General: Bowel sounds are normal.     Palpations: Abdomen is soft.     Tenderness: There is abdominal tenderness in the right upper quadrant and epigastric area.  Musculoskeletal:        General: Normal range of motion.     Cervical back: Normal range of motion.  Skin:    General: Skin is warm and dry.  Neurological:     Mental Status: She is alert and oriented to  person, place, and time.    ED Results / Procedures / Treatments  Labs (all labs ordered are listed, but only abnormal results are displayed) Labs Reviewed  CBC WITH DIFFERENTIAL/PLATELET - Abnormal; Notable for the following components:      Result Value   WBC 10.6 (*)    Hemoglobin 9.9 (*)    HCT 33.9 (*)    MCH 23.9 (*)    MCHC 29.2 (*)    RDW 15.8 (*)    Platelets 496 (*)    All other components within normal limits  COMPREHENSIVE METABOLIC PANEL - Abnormal; Notable for the following components:   Potassium 3.4 (*)    Glucose, Bld 106 (*)    Calcium 8.8 (*)    Total Protein 8.3 (*)    AST 258 (*)    ALT 124 (*)    All other components within normal limits  LIPASE, BLOOD  URINALYSIS, ROUTINE W REFLEX MICROSCOPIC  I-STAT BETA HCG BLOOD, ED ([facility], [facility], [facility] ONLY)    EKG None  Radiology US Abdomen Limited RUQ (LIVER/GB)  Result Date: 04/29/2021 CLINICAL DATA:  Right upper quadrant abdominal pain. EXAM: ULTRASOUND ABDOMEN LIMITED RIGHT UPPER QUADRANT COMPARISON:  Abdominal ultrasound dated 03/06/2021. FINDINGS: Gallbladder: Gallstone. No gallbladder wall thickening or pericholecystic fluid. Negative sonographic Murphy's sign. Common bile duct: Diameter: 8 mm Liver: There is diffuse increased liver echogenicity most commonly seen in the setting of fatty infiltration. Superimposed inflammation or fibrosis is not excluded. Clinical correlation is recommended. Portal vein is patent on color Doppler imaging with normal direction of blood flow towards the liver. Other: None. IMPRESSION: 1. Cholelithiasis without sonographic evidence of acute cholecystitis. 2. Fatty liver. Electronically Signed   By: Anner Crete M.D.   On: 04/29/2021 01:24    Procedures Procedures   Medications Ordered in ED Medications  fentaNYL (SUBLIMAZE) injection 50 mcg (50 mcg Intravenous Given 04/29/21 0143)  ondansetron (ZOFRAN) injection 4 mg (4 mg Intravenous Given 04/29/21 0142)  sodium chloride 0.9 %  bolus 1,000 mL (1,000 mLs Intravenous New Bag/Given 04/29/21 0142)    ED Course  I have reviewed the triage vital signs and the nursing notes.  Pertinent labs & imaging results that were available during my care of the patient were reviewed by me and considered in my medical decision making (see chart for details).    MDM Rules/Calculators/A&P                           25 year old female presenting to the ED with right upper quadrant pain.  History of gallstones and has had some difficulty following up in general surgery clinic.  Pain worsened today after eating some sweets.  She is afebrile and nontoxic but does appear uncomfortable.  She does have tenderness in the right upper quadrant and epigastrium without Murphy sign.  We will repeat ultrasound.  Given fentanyl and Zofran.  2:13 AM Patient pain free after dose of fentanyl in ED.  No further nausea.  VSS.  [place] with continued gallstones but no findings of cholecystitis.  She does have elevation in LFT's today which is new from prior-- 258, 124 but normal alk phos and bili.  Denies heavy recent EtOH or tylenol use.  Given lab changes today, will discuss with general surgery.  Discussed with general surgery, Dr. Dema Severin-- ok to discharge if symptoms fully controlled but will need close office follow-up but can offer admission and surgery.  Options given to patient, she would like to go home and have office follow-up so she can schedule surgery at  a more convenient time.  Feel this is reasonable.  She is aware of her elevated LFT"s and need to avoid Tylenol and alcohol.  Plan to discharge home with oxycodone, Zofran.  Return here for new concerns or uncontrolled pain/nausea/vomiting.  Final Clinical Impression(s) / ED Diagnoses Final diagnoses:  RUQ pain  Gallstones    Rx / DC Orders ED Discharge Orders     None        Larene Pickett, PA-C 04/29/21 0239    Margette Fast, MD 05/06/21 1101

## 2021-04-29 NOTE — Discharge Instructions (Addendum)
Take the prescribed medication as directed.  I would avoid tylenol and alcohol given your elevated liver tests today. Follow-up with in the general surgery clinic-- call in the morning to schedule appt. Return to the ED for new or worsening symptoms.

## 2021-05-01 ENCOUNTER — Encounter (HOSPITAL_COMMUNITY): Payer: Self-pay | Admitting: Emergency Medicine

## 2021-05-01 ENCOUNTER — Inpatient Hospital Stay (HOSPITAL_COMMUNITY): Payer: Medicaid Other

## 2021-05-01 ENCOUNTER — Inpatient Hospital Stay (HOSPITAL_COMMUNITY)
Admission: EM | Admit: 2021-05-01 | Discharge: 2021-05-05 | DRG: 419 | Disposition: A | Discharge status: Home / Self Care | Payer: Medicaid Other | Attending: Surgery | Admitting: Surgery

## 2021-05-01 ENCOUNTER — Emergency Department (HOSPITAL_COMMUNITY): Payer: Medicaid Other

## 2021-05-01 ENCOUNTER — Other Ambulatory Visit: Payer: Self-pay

## 2021-05-01 DIAGNOSIS — Z6839 Body mass index (BMI) 39.0-39.9, adult: Secondary | ICD-10-CM

## 2021-05-01 DIAGNOSIS — K8020 Calculus of gallbladder without cholecystitis without obstruction: Secondary | ICD-10-CM | POA: Diagnosis present

## 2021-05-01 DIAGNOSIS — Z882 Allergy status to sulfonamides status: Secondary | ICD-10-CM

## 2021-05-01 DIAGNOSIS — Z8719 Personal history of other diseases of the digestive system: Secondary | ICD-10-CM

## 2021-05-01 DIAGNOSIS — E669 Obesity, unspecified: Secondary | ICD-10-CM | POA: Diagnosis present

## 2021-05-01 DIAGNOSIS — F32A Depression, unspecified: Secondary | ICD-10-CM | POA: Diagnosis present

## 2021-05-01 DIAGNOSIS — Z886 Allergy status to analgesic agent status: Secondary | ICD-10-CM | POA: Diagnosis not present

## 2021-05-01 DIAGNOSIS — D649 Anemia, unspecified: Secondary | ICD-10-CM | POA: Diagnosis present

## 2021-05-01 DIAGNOSIS — R1011 Right upper quadrant pain: Secondary | ICD-10-CM

## 2021-05-01 DIAGNOSIS — Z8616 Personal history of COVID-19: Secondary | ICD-10-CM

## 2021-05-01 DIAGNOSIS — Z818 Family history of other mental and behavioral disorders: Secondary | ICD-10-CM

## 2021-05-01 DIAGNOSIS — Z885 Allergy status to narcotic agent status: Secondary | ICD-10-CM

## 2021-05-01 DIAGNOSIS — Z91018 Allergy to other foods: Secondary | ICD-10-CM | POA: Diagnosis not present

## 2021-05-01 DIAGNOSIS — Z20822 Contact with and (suspected) exposure to covid-19: Secondary | ICD-10-CM | POA: Diagnosis present

## 2021-05-01 DIAGNOSIS — K8050 Calculus of bile duct without cholangitis or cholecystitis without obstruction: Secondary | ICD-10-CM

## 2021-05-01 DIAGNOSIS — Z9889 Other specified postprocedural states: Secondary | ICD-10-CM

## 2021-05-01 DIAGNOSIS — Z9049 Acquired absence of other specified parts of digestive tract: Secondary | ICD-10-CM

## 2021-05-01 DIAGNOSIS — R933 Abnormal findings on diagnostic imaging of other parts of digestive tract: Secondary | ICD-10-CM

## 2021-05-01 DIAGNOSIS — K8064 Calculus of gallbladder and bile duct with chronic cholecystitis without obstruction: Principal | ICD-10-CM | POA: Diagnosis present

## 2021-05-01 LAB — COMPREHENSIVE METABOLIC PANEL
ALT: 374 U/L — ABNORMAL HIGH (ref 0–44)
AST: 272 U/L — ABNORMAL HIGH (ref 15–41)
Albumin: 3.7 g/dL (ref 3.5–5.0)
Alkaline Phosphatase: 147 U/L — ABNORMAL HIGH (ref 38–126)
Anion gap: 9 (ref 5–15)
BUN: 7 mg/dL (ref 6–20)
CO2: 27 mmol/L (ref 22–32)
Calcium: 9.1 mg/dL (ref 8.9–10.3)
Chloride: 103 mmol/L (ref 98–111)
Creatinine, Ser: 0.73 mg/dL (ref 0.44–1.00)
GFR, Estimated: >60 mL/min (ref >60)
Glucose, Bld: 91 mg/dL (ref 70–99)
Potassium: 3.6 mmol/L (ref 3.5–5.1)
Sodium: 139 mmol/L (ref 135–145)
Total Bilirubin: 1.9 mg/dL — ABNORMAL HIGH (ref 0.3–1.2)
Total Protein: 8.1 g/dL (ref 6.5–8.1)

## 2021-05-01 LAB — CBC
HCT: 33 % — ABNORMAL LOW (ref 36.0–46.0)
Hemoglobin: 9.3 g/dL — ABNORMAL LOW (ref 12.0–15.0)
MCH: 23.4 pg — ABNORMAL LOW (ref 26.0–34.0)
MCHC: 28.2 g/dL — ABNORMAL LOW (ref 30.0–36.0)
MCV: 83.1 fL (ref 80.0–100.0)
Platelets: 496 10*3/uL — ABNORMAL HIGH (ref 150–400)
RBC: 3.97 MIL/uL (ref 3.87–5.11)
RDW: 15.9 % — ABNORMAL HIGH (ref 11.5–15.5)
WBC: 5.4 10*3/uL (ref 4.0–10.5)
nRBC: 0 % (ref 0.0–0.2)

## 2021-05-01 LAB — RESP PANEL BY RT-PCR (FLU A&B, COVID) ARPGX2
Influenza A by PCR: NEGATIVE
Influenza B by PCR: NEGATIVE
SARS Coronavirus 2 by RT PCR: POSITIVE — AB

## 2021-05-01 LAB — URINALYSIS, ROUTINE W REFLEX MICROSCOPIC
Bacteria, UA: NONE SEEN
Bilirubin Urine: NEGATIVE
Glucose, UA: NEGATIVE mg/dL
Hgb urine dipstick: NEGATIVE
Ketones, ur: 20 mg/dL — AB
Leukocytes,Ua: NEGATIVE
Nitrite: NEGATIVE
Protein, ur: NEGATIVE mg/dL
Specific Gravity, Urine: 1.015 (ref 1.005–1.030)
pH: 7 (ref 5.0–8.0)

## 2021-05-01 LAB — LIPASE, BLOOD: Lipase: 28 U/L (ref 11–51)

## 2021-05-01 LAB — I-STAT BETA HCG BLOOD, ED (MC, WL, AP ONLY): I-stat hCG, quantitative: <5 m[IU]/mL (ref <5)

## 2021-05-01 MED ORDER — ONDANSETRON 4 MG PO TBDP: 4.0000 mg | ORAL_TABLET | Freq: Four times a day (QID) | ORAL | Status: DC | PRN

## 2021-05-01 MED ORDER — FENTANYL CITRATE PF 50 MCG/ML IJ SOSY
50.0000 ug | PREFILLED_SYRINGE | Freq: Once | INTRAMUSCULAR | Status: AC
Start: 2021-05-01 — End: 2021-05-01
  Administered 2021-05-01: 50 ug via INTRAVENOUS
  Filled 2021-05-01: qty 1

## 2021-05-01 MED ORDER — ONDANSETRON HCL 4 MG/2ML IJ SOLN
4.0000 mg | Freq: Once | INTRAMUSCULAR | Status: AC
  Administered 2021-05-01: 4 mg via INTRAVENOUS
  Filled 2021-05-01: qty 2

## 2021-05-01 MED ORDER — FENTANYL CITRATE PF 50 MCG/ML IJ SOSY
12.5000 ug | PREFILLED_SYRINGE | INTRAMUSCULAR | Status: DC | PRN
  Administered 2021-05-01 – 2021-05-02 (×7): 25 ug via INTRAVENOUS
  Filled 2021-05-01 (×7): qty 1

## 2021-05-01 MED ORDER — PANTOPRAZOLE SODIUM 40 MG IV SOLR
40.0000 mg | Freq: Every day | INTRAVENOUS | Status: DC
  Administered 2021-05-01 – 2021-05-04 (×4): 40 mg via INTRAVENOUS
  Filled 2021-05-01 (×4): qty 40

## 2021-05-01 MED ORDER — SODIUM CHLORIDE 0.9 % IV BOLUS
1000.0000 mL | Freq: Once | INTRAVENOUS | Status: AC
  Administered 2021-05-01: 1000 mL via INTRAVENOUS

## 2021-05-01 MED ORDER — KCL IN DEXTROSE-NACL 20-5-0.9 MEQ/L-%-% IV SOLN
INTRAVENOUS | Status: DC
  Administered 2021-05-02: 100 mL/h via INTRAVENOUS
  Filled 2021-05-01 (×8): qty 1000

## 2021-05-01 MED ORDER — GADOBUTROL 1 MMOL/ML IV SOLN
10.0000 mL | Freq: Once | INTRAVENOUS | Status: AC | PRN
  Administered 2021-05-01: 10 mL via INTRAVENOUS

## 2021-05-01 MED ORDER — ONDANSETRON HCL 4 MG/2ML IJ SOLN
4.0000 mg | Freq: Four times a day (QID) | INTRAMUSCULAR | Status: DC | PRN
  Administered 2021-05-02 – 2021-05-03 (×3): 4 mg via INTRAVENOUS
  Filled 2021-05-01 (×2): qty 2

## 2021-05-01 NOTE — Progress Notes (Signed)
Pt had covid a month ago. No symptoms at this time.

## 2021-05-01 NOTE — ED Notes (Signed)
Patient transported to MRI 

## 2021-05-01 NOTE — H&P (Signed)
Sandra Macdonald is an 25 y.o. female.   Chief Complaint: abd pain HPI: The patient is a 25 year old black female who presents with RUQ abd pain since Friday. She was here last week with the same complaint but refused admission. Pain is associated with significant nausea and vomiting. US shows gallstones. Lft's significantly elevated since last week  Past Medical History:  Diagnosis Date   Depression    Fibroid    G6PD deficiency    Supervision of normal first pregnancy, antepartum 02/18/2018    Nursing Staff Provider Office Location  [facility] Dating  LMP/6 week [place]  Language   English  Anatomy US   Flu Vaccine  n/a Genetic Screen  NIPS: low risk, female  AFP:   TDaP vaccine    Hgb A1C or  GTT Early  Third trimester  Rhogam     LAB RESULTS  Feeding Plan Breast Blood Type A/Positive/-- (07/15 6568)  Contraception  Antibody Negative (07/15 0942) Circumcision  Rubella 3.00 (07/15 0942) Pediatricia    Past Surgical History:  Procedure Laterality Date   NO PAST SURGERIES      Family History  Problem Relation Age of Onset   Mental illness Mother    ADD / ADHD Neg Hx    Anxiety disorder Neg Hx    Alcohol abuse Neg Hx    Arthritis Neg Hx    Asthma Neg Hx    Cancer Neg Hx    Birth defects Neg Hx    Depression Neg Hx    COPD Neg Hx    Diabetes Neg Hx    Drug abuse Neg Hx    Early death Neg Hx    Hearing loss Neg Hx    Heart disease Neg Hx    Hyperlipidemia Neg Hx    Hypertension Neg Hx    Intellectual disability Neg Hx    Kidney disease Neg Hx    Learning disabilities Neg Hx    Miscarriages / Stillbirths Neg Hx    Stroke Neg Hx    Obesity Neg Hx    Vision loss Neg Hx    Varicose Veins Neg Hx    Social History:  reports that she has never smoked. She has never used smokeless tobacco. She reports that she does not currently use alcohol after a past usage of about 1.0 standard drink per week. She reports that she does not currently use drugs after having used the following drugs: Marijuana.  Frequency: 14.00 times per week.  Allergies:  Allergies  Allergen Reactions   Bactrim [Sulfamethoxazole-Trimethoprim]     Trouble breathing, swelling   Blue Dyes (Parenteral) Other (See Comments)    G6PD-- Decreased hemoglobin   Codeine Other (See Comments)    Decreased hemoglobin   Ibuprofen     Trouble breathing, swelling   Other Other (See Comments)    G6PD--Decreased hemoglobin to potato chips (not potatoes in other forms)   Penicillins     Trouble breathing, swelling    (Not in a hospital admission)   Results for orders placed or performed during the hospital encounter of 05/01/21 (from the past 48 hour(s))  Lipase, blood     Status: None   Collection Time: 05/01/21  9:32 AM  Result Value Ref Range   Lipase 28 11 - 51 U/L    Comment: Performed at [facility], [place] [address] [place], [place] [address]  Comprehensive metabolic panel     Status: Abnormal   Collection Time: 05/01/21  9:32  AM  Result Value Ref Range   Sodium 139 135 - 145 mmol/L   Potassium 3.6 3.5 - 5.1 mmol/L   Chloride 103 98 - 111 mmol/L   CO2 27 22 - 32 mmol/L   Glucose, Bld 91 70 - 99 mg/dL    Comment: Glucose reference range applies only to samples taken after fasting for at least 8 hours.   BUN 7 6 - 20 mg/dL   Creatinine, Ser 0.73 0.44 - 1.00 mg/dL   Calcium 9.1 8.9 - 10.3 mg/dL   Total Protein 8.1 6.5 - 8.1 g/dL   Albumin 3.7 3.5 - 5.0 g/dL   AST 272 (H) 15 - 41 U/L   ALT 374 (H) 0 - 44 U/L   Alkaline Phosphatase 147 (H) 38 - 126 U/L   Total Bilirubin 1.9 (H) 0.3 - 1.2 mg/dL   GFR, Estimated >60 >60 mL/min    Comment: (NOTE) Calculated using the CKD-EPI Creatinine Equation (2021)    Anion gap 9 5 - 15    Comment: Performed at [facility], [place] [address], [place], [place] [address]  CBC     Status: Abnormal   Collection Time: 05/01/21  9:32 AM  Result Value Ref Range   WBC 5.4 4.0 - 10.5 K/uL   RBC 3.97 3.87 - 5.11 MIL/uL   Hemoglobin 9.3 (L)  12.0 - 15.0 g/dL   HCT 33.0 (L) 36.0 - 46.0 %   MCV 83.1 80.0 - 100.0 fL   MCH 23.4 (L) 26.0 - 34.0 pg   MCHC 28.2 (L) 30.0 - 36.0 g/dL   RDW 15.9 (H) 11.5 - 15.5 %   Platelets 496 (H) 150 - 400 K/uL   nRBC 0.0 0.0 - 0.2 %    Comment: Performed at [facility], [place] [address], [place], [place] [address]  Urinalysis, Routine w reflex microscopic     Status: Abnormal   Collection Time: 05/01/21  9:32 AM  Result Value Ref Range   Color, Urine YELLOW YELLOW   APPearance CLEAR CLEAR   Specific Gravity, Urine 1.015 1.005 - 1.030   pH 7.0 5.0 - 8.0   Glucose, UA NEGATIVE NEGATIVE mg/dL   Hgb urine dipstick NEGATIVE NEGATIVE   Bilirubin Urine NEGATIVE NEGATIVE   Ketones, ur 20 (A) NEGATIVE mg/dL   Protein, ur NEGATIVE NEGATIVE mg/dL   Nitrite NEGATIVE NEGATIVE   Leukocytes,Ua NEGATIVE NEGATIVE   RBC / HPF 0-5 0 - 5 RBC/hpf   WBC, UA 0-5 0 - 5 WBC/hpf   Bacteria, UA NONE SEEN NONE SEEN   Squamous Epithelial / LPF 0-5 0 - 5    Comment: Performed at [facility], [place] [address], [place], [place] [address]  I-Stat beta hCG blood, ED     Status: None   Collection Time: 05/01/21  9:39 AM  Result Value Ref Range   I-stat hCG, quantitative <5.0 <5 mIU/mL   Comment 3            Comment:   GEST. AGE      CONC.  (mIU/mL)   <=1 WEEK        5 - 50     2 WEEKS       50 - 500     3 WEEKS       100 - 10,000     4 WEEKS     1,000 - 30,000        FEMALE AND NON-PREGNANT FEMALE:  LESS THAN 5 mIU/mL    US Abdomen Limited RUQ (LIVER/GB)  Result Date: 05/01/2021 CLINICAL DATA:  RIGHT upper quadrant pain EXAM: ULTRASOUND ABDOMEN LIMITED RIGHT UPPER QUADRANT COMPARISON:  04/29/2021 FINDINGS: Gallbladder: Cholelithiasis. No wall thickening visualized. No sonographic Murphy sign noted by sonographer. Common bile duct: Diameter: 7 mm, within normal limits Liver: No focal lesion identified. Diffusely increased hepatic parenchymal echogenicity. Portal vein is patent on  color Doppler imaging with normal direction of blood flow towards the liver. Other: None. IMPRESSION: 1. Cholelithiasis without sonographic evidence of acute cholecystitis. 2. Hepatic steatosis. Electronically Signed   By: Valentino Saxon M.D.   On: 05/01/2021 10:29    Review of Systems  Constitutional:  Positive for appetite change.  HENT: Negative.    Eyes: Negative.   Respiratory: Negative.    Cardiovascular: Negative.   Gastrointestinal:  Positive for abdominal distention, nausea and vomiting.  Endocrine: Negative.   Genitourinary: Negative.   Musculoskeletal: Negative.   Skin: Negative.   Allergic/Immunologic: Negative.   Neurological: Negative.   Hematological: Negative.   Psychiatric/Behavioral: Negative.     Blood pressure (!) 144/73, pulse 79, temperature 98.9 F (37.2 C), temperature source Oral, resp. rate 19, SpO2 100 %. Physical Exam Constitutional:      General: She is not in acute distress.    Appearance: Normal appearance. She is obese.  HENT:     Head: Normocephalic and atraumatic.     Right Ear: External ear normal.     Left Ear: External ear normal.     Nose: Nose normal.     Mouth/Throat:     Mouth: Mucous membranes are moist.     Pharynx: Oropharynx is clear.  Eyes:     Extraocular Movements: Extraocular movements intact.     Conjunctiva/sclera: Conjunctivae normal.     Pupils: Pupils are equal, round, and reactive to light.  Cardiovascular:     Rate and Rhythm: Normal rate and regular rhythm.     Pulses: Normal pulses.     Heart sounds: Normal heart sounds.  Pulmonary:     Effort: Pulmonary effort is normal. No respiratory distress.     Breath sounds: Normal breath sounds.  Abdominal:     General: Abdomen is flat. Bowel sounds are normal.     Palpations: Abdomen is soft. There is no mass.     Tenderness: There is abdominal tenderness.  Musculoskeletal:        General: No swelling or deformity. Normal range of motion.     Cervical back: Normal  range of motion and neck supple.  Skin:    General: Skin is warm and dry.  Neurological:     General: No focal deficit present.     Mental Status: She is alert and oriented to person, place, and time.  Psychiatric:        Mood and Affect: Mood normal.        Behavior: Behavior normal.     Assessment/Plan The patient appears to have symptomatic gallstones and may have a cbd stone given the elevation of her lft's. Will admit for bowel rest, IV hydration, and pain control. Will obtain MRCP to look for cbd stone. May need GI consult. Would likely benefit from having gallbladder removed during this hospitalization  Autumn Messing III, MD 05/01/2021, 2:46 PM

## 2021-05-01 NOTE — ED Notes (Signed)
4W called to assign patient to RN upstairs

## 2021-05-01 NOTE — ED Triage Notes (Signed)
Patient here from home reporting upper abd pain. Reports hx of gallstones. N/v. States symptoms started yesterday.

## 2021-05-01 NOTE — Progress Notes (Addendum)
Orders  received to d/c continue cardiac monitoring. By Dr. Marlou Starks.

## 2021-05-01 NOTE — ED Provider Notes (Signed)
Emergency Medicine Provider Triage Evaluation Note  Sandra Macdonald , a 25 y.o. female  was evaluated in triage.  Pt complains of right upper quadrant abdominal pain.  Patient reports that she has been having pain intermittently since July.  Pain has been worse over the last few days.  Patient was seen on 9/23 for similar complaints of right upper quadrant abdominal pain.  Patient reports that pain is not being controlled at home with hydrocodone.  Pain is worse after eating meals.   Review of Systems  Positive: Abdominal pain, nausea, vomiting Negative: Fever, chills  Physical Exam  BP (!) 143/86 (BP Location: Left Arm)   Pulse 90   Temp 98.9 F (37.2 C) (Oral)   Resp 18   SpO2 100%  Gen:   Awake, no distress   Resp:  Normal effort  MSK:   Moves extremities without difficulty  Other:  Abdomen soft, nondistended, tenderness to right upper quadrant, positive Murphy sign.  No peritoneal signs.  Medical Decision Making  Medically screening exam initiated at 9:24 AM.  Appropriate orders placed.  Sandra Macdonald was informed that the remainder of the evaluation will be completed by another provider, this initial triage assessment does not replace that evaluation, and the importance of remaining in the ED until their evaluation is complete.  The patient appears stable so that the remainder of the work up may be completed by another provider.      Loni Beckwith, PA-C 05/01/21 5638    Charlesetta Shanks, MD 05/01/21 [phone]

## 2021-05-01 NOTE — ED Provider Notes (Addendum)
[facility] DEPT Provider Note   CSN: [id number] Arrival date & time: 05/01/21  0746     History Chief Complaint  Patient presents with   Abdominal Pain   Emesis    Sandra Macdonald is a 25 y.o. female.  With past medical history of G6PD who presents emergency department with right upper quadrant pain and vomiting.  States that she has had ongoing abdominal pain intermittently since July.  States that she was seen on 04/29/2021 for right upper quadrant pain and since then has had worsening abdominal pain.  She describes it as cramping, sharp, intermittent, 8/10.  States that it is worse with eating meals.  Improves with laying down.  Has had associated decreased p.o. intake over the past few days.  1 episode of nonbloody vomiting this morning.  Endorses ongoing nausea.  She denies diarrhea, fever or chills urinary symptoms, vaginal complaints, flank pain.  She denies Tylenol or alcohol use.  Stated above she was seen on 04/29/2021 here in the emergency department for similar.  At that time they noted she was diagnosed with gallstones in July and attempted to follow-up with general surgery, however states surgeon that she saw in the office today reported that "they did not do gallbladders" and then was lost to follow-up.  She was seen on Friday labs showed WBC 10.6, AST 258, ALT 124, normal alk phos, T bili 0.9.  Ultrasound of the right upper quadrant showed cholelithiasis without evidence of acute cholecystitis.  Case was discussed with Dr. Dema Severin with general surgery who states it is okay to discharge if her symptoms are controlled but will need close follow-up.  She was given the option to be admitted and have surgery but stated she wanted to go home and have office follow-up.  Was discharged with oxycodone and Zofran.   Abdominal Pain Associated symptoms: nausea and vomiting   Associated symptoms: no chills, no diarrhea, no dysuria, no fever and no vaginal discharge    Emesis Associated symptoms: abdominal pain   Associated symptoms: no chills, no diarrhea and no fever       Past Medical History:  Diagnosis Date   Depression    Fibroid    G6PD deficiency    Supervision of normal first pregnancy, antepartum 02/18/2018    Nursing Staff Provider Office Location  [facility] Dating  LMP/6 week [place]  Language   English  Anatomy US   Flu Vaccine  n/a Genetic Screen  NIPS: low risk, female  AFP:   TDaP vaccine    Hgb A1C or  GTT Early  Third trimester  Rhogam     LAB RESULTS  Feeding Plan Breast Blood Type A/Positive/-- (07/15 0942)  Contraception  Antibody Negative (07/15 0942) Circumcision  Rubella 3.00 (07/15 0942) Pediatricia    Patient Active Problem List   Diagnosis Date Noted   History of command hallucinations    Iron deficiency anemia 12/28/2020   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   Psychosis ([place]) 12/03/2019   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   Incompetent cervix in pregnancy, antepartum, 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   Vaginal bleeding before [redacted] weeks gestation 03/20/2018   SAB (spontaneous abortion) 03/20/2018   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   G6PD deficiency anemia ([place]) 01/14/2018   Fibroids 12/30/2017    Past Surgical History:  Procedure Laterality Date   NO PAST SURGERIES       OB History     Gravida  2   Para  Term      Preterm      AB  2   Living         SAB  2   IAB      Ectopic      Multiple      Live Births              Family History  Problem Relation Age of Onset   Mental illness Mother    ADD / ADHD Neg Hx    Anxiety disorder Neg Hx    Alcohol abuse Neg Hx    Arthritis Neg Hx    Asthma Neg Hx    Cancer Neg Hx    Birth defects Neg Hx    Depression Neg Hx    COPD Neg Hx    Diabetes Neg Hx    Drug abuse Neg Hx    Early death Neg Hx    Hearing loss Neg Hx    Heart disease Neg Hx    Hyperlipidemia Neg Hx    Hypertension Neg Hx    Intellectual disability Neg  Hx    Kidney disease Neg Hx    Learning disabilities Neg Hx    Miscarriages / Stillbirths Neg Hx    Stroke Neg Hx    Obesity Neg Hx    Vision loss Neg Hx    Varicose Veins Neg Hx     Social History   Tobacco Use   Smoking status: Never   Smokeless tobacco: Never  Vaping Use   Vaping Use: Never used  Substance Use Topics   Alcohol use: Not Currently    Alcohol/week: 1.0 standard drink    Types: 1 Standard drinks or equivalent per week   Drug use: Not Currently    Frequency: 14.0 times per week    Types: Marijuana    Comment: pt reports 1-2 blunts on work days, more on days off    Home Medications Prior to Admission medications   Medication Sig Start Date End Date Taking? Authorizing Provider  FLUoxetine (PROZAC) 10 MG capsule Take 1 capsule (10 mg total) by mouth daily. Patient not taking: Reported on 03/06/2021 12/28/20   Freida Busman, MD  iron polysaccharides (NIFEREX) 150 MG capsule Take 1 capsule (150 mg total) by mouth daily. Patient not taking: Reported on 03/06/2021 12/29/20   Armando Reichert, MD  OLANZapine (ZYPREXA) 2.5 MG tablet Take 1 tablet (2.5 mg total) by mouth at bedtime. Patient not taking: Reported on 03/06/2021 12/29/20   Armando Reichert, MD  omeprazole (PRILOSEC) 20 MG capsule Take 1 capsule (20 mg total) by mouth daily for 7 days. Patient not taking: Reported on 03/06/2021 03/01/21 03/08/21  Janeece Fitting, PA-C  ondansetron (ZOFRAN ODT) 4 MG disintegrating tablet Take 1 tablet (4 mg total) by mouth every 8 (eight) hours as needed for nausea. 04/29/21   Larene Pickett, PA-C  oxyCODONE (ROXICODONE) 5 MG immediate release tablet Take 1 tablet (5 mg total) by mouth every 4 (four) hours as needed for severe pain. 04/29/21   Larene Pickett, PA-C  polyethylene glycol (MIRALAX / GLYCOLAX) 17 g packet Take 17 g by mouth daily as needed for mild constipation. Patient not taking: Reported on 03/06/2021 12/29/20   Armando Reichert, MD  triamcinolone (KENALOG) 0.1 % Apply 1  application topically 2 (two) times daily. Patient not taking: Reported on 03/06/2021 09/21/20   Margarita Mail, PA-C    Allergies    Bactrim [sulfamethoxazole-trimethoprim], Blue dyes (parenteral), Codeine, Ibuprofen, Other, and  Penicillins  Review of Systems   Review of Systems  Constitutional:  Positive for appetite change. Negative for chills and fever.  Gastrointestinal:  Positive for abdominal pain, nausea and vomiting. Negative for diarrhea.  Genitourinary:  Negative for dysuria, flank pain and vaginal discharge.  All other systems reviewed and are negative.  Physical Exam Updated Vital Signs BP (!) 144/73 (BP Location: Left Arm)   Pulse 79   Temp 98.9 F (37.2 C) (Oral)   Resp 19   SpO2 100%   Physical Exam Vitals and nursing note reviewed.  Constitutional:      General: She is not in acute distress.    Appearance: Normal appearance. She is well-developed.  HENT:     Head: Normocephalic and atraumatic.     Mouth/Throat:     Mouth: Mucous membranes are moist.     Pharynx: Oropharynx is clear.  Eyes:     General: No scleral icterus.    Extraocular Movements: Extraocular movements intact.     Pupils: Pupils are equal, round, and reactive to light.  Cardiovascular:     Rate and Rhythm: Normal rate and regular rhythm.     Heart sounds: Normal heart sounds. No murmur heard. Pulmonary:     Effort: Pulmonary effort is normal. No respiratory distress.     Breath sounds: Normal breath sounds.  Abdominal:     General: Abdomen is protuberant. Bowel sounds are normal. There is no distension.     Palpations: Abdomen is soft. There is no hepatomegaly.     Tenderness: There is abdominal tenderness in the right upper quadrant. There is no right CVA tenderness, left CVA tenderness or guarding. Positive signs include Murphy's sign. Negative signs include Rovsing's sign, McBurney's sign and psoas sign.  Musculoskeletal:     Cervical back: Neck supple.  Skin:    General: Skin is  warm and dry.     Capillary Refill: Capillary refill takes less than 2 seconds.  Neurological:     General: No focal deficit present.     Mental Status: She is alert and oriented to person, place, and time.  Psychiatric:        Mood and Affect: Mood normal.        Behavior: Behavior normal.    ED Results / Procedures / Treatments   Labs (all labs ordered are listed, but only abnormal results are displayed) Labs Reviewed  COMPREHENSIVE METABOLIC PANEL - Abnormal; Notable for the following components:      Result Value   AST 272 (*)    ALT 374 (*)    Alkaline Phosphatase 147 (*)    Total Bilirubin 1.9 (*)    All other components within normal limits  CBC - Abnormal; Notable for the following components:   Hemoglobin 9.3 (*)    HCT 33.0 (*)    MCH 23.4 (*)    MCHC 28.2 (*)    RDW 15.9 (*)    Platelets 496 (*)    All other components within normal limits  URINALYSIS, ROUTINE W REFLEX MICROSCOPIC - Abnormal; Notable for the following components:   Ketones, ur 20 (*)    All other components within normal limits  LIPASE, BLOOD  I-STAT BETA HCG BLOOD, ED ([facility], [facility], [facility] ONLY)    EKG None  Radiology US Abdomen Limited RUQ (LIVER/GB)  Result Date: 05/01/2021 CLINICAL DATA:  RIGHT upper quadrant pain EXAM: ULTRASOUND ABDOMEN LIMITED RIGHT UPPER QUADRANT COMPARISON:  04/29/2021 FINDINGS: Gallbladder: Cholelithiasis. No wall thickening visualized. No sonographic Percell Miller  sign noted by sonographer. Common bile duct: Diameter: 7 mm, within normal limits Liver: No focal lesion identified. Diffusely increased hepatic parenchymal echogenicity. Portal vein is patent on color Doppler imaging with normal direction of blood flow towards the liver. Other: None. IMPRESSION: 1. Cholelithiasis without sonographic evidence of acute cholecystitis. 2. Hepatic steatosis. Electronically Signed   By: Valentino Saxon M.D.   On: 05/01/2021 10:29    Procedures Procedures   Medications Ordered in  ED Medications  fentaNYL (SUBLIMAZE) injection 50 mcg (has no administration in time range)  ondansetron (ZOFRAN) injection 4 mg (4 mg Intravenous Given 05/01/21 1415)  fentaNYL (SUBLIMAZE) injection 50 mcg (50 mcg Intravenous Given 05/01/21 1415)  sodium chloride 0.9 % bolus 1,000 mL (1,000 mLs Intravenous New Bag/Given 05/01/21 1415)   ED Course  I have reviewed the triage vital signs and the nursing notes.  Pertinent labs & imaging results that were available during my care of the patient were reviewed by me and considered in my medical decision making (see chart for details).   MDM Rules/Calculators/A&P 25 year old female who presents emergency department with history, HPI and work-up as described above.  Presentation and work-up is not consistent with acute pancreatitis (negative lipase), peptic ulcer disease, acute infectious processes like hepatitis, pyelonephritis or pneumonia.  Doubt appendicitis.  Presentation is also not consistent with a bowel obstruction.  There is no evidence of other acute, emergent causes of abdominal pain at this time.  Her right upper quadrant pain is consistent with known cholelithiasis without sonographic evidence of cholecystitis.  Her abdominal exam is without peritoneal signs.  She has no evidence of acute abdomen at this time. She is somewhat ill-appearing however not toxic in appearance.  Given her right upper quadrant ultrasound findings the patient likely has worsening biliary colic.  She has no evidence of acute cholangitis at this time as she has no leukocytosis, fevers.  She is not jaundiced on exam.  Initial lab work with increasing LFTs since she was seen on 04/29/2024 AST 258-->272 ALT 124 --> 374 T. Bili 0.9 --> 1.9 Alk Phos 106 --> 147  1354: Spoke with Toffe, MD with general surgery regarding admission.  States they will come see the patient.  1500: Patient will be admitted by general surgery for possible MRCP/ERCP and cholecystectomy.  I  have placed COVID-19 orders for screening.  We will also keep patient n.p.o. for now until general surgery creates plan.   Final Clinical Impression(s) / ED Diagnoses Final diagnoses:  RUQ pain    Rx / DC Orders ED Discharge Orders     None        Mickie Hillier, PA-C 05/01/21 1514    Mickie Hillier, PA-C 05/01/21 1515    Charlesetta Shanks, MD 05/25/21 [phone]

## 2021-05-01 NOTE — ED Notes (Signed)
US at bedside

## 2021-05-02 ENCOUNTER — Encounter (HOSPITAL_COMMUNITY): Payer: Self-pay

## 2021-05-02 LAB — COMPREHENSIVE METABOLIC PANEL
ALT: 308 U/L — ABNORMAL HIGH (ref 0–44)
AST: 203 U/L — ABNORMAL HIGH (ref 15–41)
Albumin: 3.1 g/dL — ABNORMAL LOW (ref 3.5–5.0)
Alkaline Phosphatase: 126 U/L (ref 38–126)
Anion gap: 3 — ABNORMAL LOW (ref 5–15)
BUN: 6 mg/dL (ref 6–20)
CO2: 27 mmol/L (ref 22–32)
Calcium: 8.7 mg/dL — ABNORMAL LOW (ref 8.9–10.3)
Chloride: 111 mmol/L (ref 98–111)
Creatinine, Ser: 0.67 mg/dL (ref 0.44–1.00)
GFR, Estimated: >60 mL/min (ref >60)
Glucose, Bld: 96 mg/dL (ref 70–99)
Potassium: 4 mmol/L (ref 3.5–5.1)
Sodium: 141 mmol/L (ref 135–145)
Total Bilirubin: 2.8 mg/dL — ABNORMAL HIGH (ref 0.3–1.2)
Total Protein: 6.8 g/dL (ref 6.5–8.1)

## 2021-05-02 LAB — CBC
HCT: 28.5 % — ABNORMAL LOW (ref 36.0–46.0)
Hemoglobin: 8.3 g/dL — ABNORMAL LOW (ref 12.0–15.0)
MCH: 23.4 pg — ABNORMAL LOW (ref 26.0–34.0)
MCHC: 29.1 g/dL — ABNORMAL LOW (ref 30.0–36.0)
MCV: 80.3 fL (ref 80.0–100.0)
Platelets: 419 10*3/uL — ABNORMAL HIGH (ref 150–400)
RBC: 3.55 MIL/uL — ABNORMAL LOW (ref 3.87–5.11)
RDW: 15.9 % — ABNORMAL HIGH (ref 11.5–15.5)
WBC: 4.1 10*3/uL (ref 4.0–10.5)
nRBC: 0 % (ref 0.0–0.2)

## 2021-05-02 LAB — SURGICAL PCR SCREEN
MRSA, PCR: NEGATIVE
Staphylococcus aureus: NEGATIVE

## 2021-05-02 MED ORDER — METHOCARBAMOL 1000 MG/10ML IJ SOLN
500.0000 mg | Freq: Three times a day (TID) | INTRAVENOUS | Status: DC | PRN
  Administered 2021-05-02: 500 mg via INTRAVENOUS
  Filled 2021-05-02: qty 500
  Filled 2021-05-02: qty 5

## 2021-05-02 MED ORDER — SODIUM CHLORIDE 0.9 % IV SOLN: INTRAVENOUS | Status: DC

## 2021-05-02 MED ORDER — ENOXAPARIN SODIUM 40 MG/0.4ML IJ SOSY: 40.0000 mg | PREFILLED_SYRINGE | INTRAMUSCULAR | Status: DC

## 2021-05-02 MED ORDER — ENOXAPARIN SODIUM 40 MG/0.4ML IJ SOSY
40.0000 mg | PREFILLED_SYRINGE | INTRAMUSCULAR | Status: DC
  Administered 2021-05-03: 40 mg via SUBCUTANEOUS
  Filled 2021-05-02: qty 0.4

## 2021-05-02 MED ORDER — MORPHINE SULFATE (PF) 2 MG/ML IV SOLN
2.0000 mg | INTRAVENOUS | Status: DC | PRN
  Administered 2021-05-02 – 2021-05-04 (×6): 2 mg via INTRAVENOUS
  Filled 2021-05-02 (×7): qty 1

## 2021-05-02 MED ORDER — MUPIROCIN 2 % EX OINT: 1.0000 "application " | TOPICAL_OINTMENT | Freq: Two times a day (BID) | CUTANEOUS | Status: DC

## 2021-05-02 MED ORDER — ACETAMINOPHEN 500 MG PO TABS
1000.0000 mg | ORAL_TABLET | Freq: Four times a day (QID) | ORAL | Status: DC | PRN
  Administered 2021-05-04: 1000 mg via ORAL
  Filled 2021-05-02: qty 2

## 2021-05-02 MED ORDER — CIPROFLOXACIN IN D5W 400 MG/200ML IV SOLN
400.0000 mg | INTRAVENOUS | Status: AC
  Administered 2021-05-03: 400 mg via INTRAVENOUS

## 2021-05-02 NOTE — Consult Note (Signed)
[facility] Consultation Note  Referring Provider: [facility] Primary Care Physician:  Patient, No Pcp Per (Inactive)  Reason for Consultation:  Bile duct stone  HPI: Sandra Macdonald is a 25 y.o. female with couple-month history of intermittent upper abdominal pain.  Worse recently, necessitating admission.  MRCP shows choledocholithiasis and cholelithiasis.  She has some nausea/vomiting; no fevers/chills or blood in stool.   Past Medical History:  Diagnosis Date   Depression    Fibroid    G6PD deficiency    Supervision of normal first pregnancy, antepartum 02/18/2018    Nursing Staff Provider Office Location  [facility] Dating  LMP/6 week [place]  Language   English  Anatomy US   Flu Vaccine  n/a Genetic Screen  NIPS: low risk, female  AFP:   TDaP vaccine    Hgb A1C or  GTT Early  Third trimester  Rhogam     LAB RESULTS  Feeding Plan Breast Blood Type A/Positive/-- (07/15 0942)  Contraception  Antibody Negative (07/15 0942) Circumcision  Rubella 3.00 (07/15 0942) Pediatricia    Past Surgical History:  Procedure Laterality Date   NO PAST SURGERIES      Prior to Admission medications   Medication Sig Start Date End Date Taking? Authorizing Provider  naproxen sodium (ALEVE) 220 MG tablet Take 440 mg by mouth daily as needed (pain).   Yes [provider]  ondansetron (ZOFRAN ODT) 4 MG disintegrating tablet Take 1 tablet (4 mg total) by mouth every 8 (eight) hours as needed for nausea. 04/29/21  Yes Larene Pickett, PA-C  oxyCODONE (ROXICODONE) 5 MG immediate release tablet Take 1 tablet (5 mg total) by mouth every 4 (four) hours as needed for severe pain. 04/29/21  Yes Larene Pickett, PA-C  FLUoxetine (PROZAC) 10 MG capsule Take 1 capsule (10 mg total) by mouth daily. Patient not taking: No sig reported 12/28/20   Freida Busman, MD  iron polysaccharides (NIFEREX) 150 MG capsule Take 1 capsule (150 mg total) by mouth daily. Patient not taking: No sig reported 12/29/20    Armando Reichert, MD  OLANZapine (ZYPREXA) 2.5 MG tablet Take 1 tablet (2.5 mg total) by mouth at bedtime. Patient not taking: No sig reported 12/29/20   Armando Reichert, MD  omeprazole (PRILOSEC) 20 MG capsule Take 1 capsule (20 mg total) by mouth daily for 7 days. Patient not taking: Reported on 03/06/2021 03/01/21 03/08/21  Janeece Fitting, PA-C  polyethylene glycol (MIRALAX / GLYCOLAX) 17 g packet Take 17 g by mouth daily as needed for mild constipation. Patient not taking: No sig reported 12/29/20   Armando Reichert, MD  triamcinolone (KENALOG) 0.1 % Apply 1 application topically 2 (two) times daily. Patient not taking: No sig reported 09/21/20   Margarita Mail, PA-C    Current Facility-Administered Medications  Medication Dose Route Frequency Provider Last Rate Last Admin   acetaminophen (TYLENOL) tablet 1,000 mg  1,000 mg Oral Q6H PRN Maczis, Barth Kirks, PA-C       dextrose 5 % and 0.9 % NaCl with KCl 20 mEq/L infusion   Intravenous Continuous Autumn Messing III, MD 100 mL/hr at 05/02/21 0432 100 mL/hr at 05/02/21 0432   [START ON 05/03/2021] enoxaparin (LOVENOX) injection 40 mg  40 mg Subcutaneous Q24H Maczis, Barth Kirks, PA-C       methocarbamol (ROBAXIN) 500 mg in dextrose 5 % 50 mL IVPB  500 mg Intravenous Q8H PRN Maczis, Barth Kirks, PA-C       morphine 2 MG/ML injection 2 mg  2 mg Intravenous Q2H PRN Jillyn Ledger, PA-C   2 mg at 05/02/21 1248   mupirocin ointment (BACTROBAN) 2 % 1 application  1 application Nasal BID Armandina Gemma, MD       ondansetron (ZOFRAN-ODT) disintegrating tablet 4 mg  4 mg Oral Q6H PRN Jovita Kussmaul, MD       Or   ondansetron [facility]) injection 4 mg  4 mg Intravenous Q6H PRN Autumn Messing III, MD   4 mg at 05/02/21 1244   pantoprazole (PROTONIX) injection 40 mg  40 mg Intravenous QHS Autumn Messing III, MD   40 mg at 05/01/21 2137    Allergies as of 05/01/2021 - Review Complete 05/01/2021  Allergen Reaction Noted   Bactrim [sulfamethoxazole-trimethoprim]  12/28/2020   Blue dyes  (parenteral) Other (See Comments) 09/16/2017   Codeine Other (See Comments) 09/16/2017   Ibuprofen  12/28/2020   Other Other (See Comments) 09/16/2017   Penicillins  12/28/2020    Family History  Problem Relation Age of Onset   Mental illness Mother    ADD / ADHD Neg Hx    Anxiety disorder Neg Hx    Alcohol abuse Neg Hx    Arthritis Neg Hx    Asthma Neg Hx    Cancer Neg Hx    Birth defects Neg Hx    Depression Neg Hx    COPD Neg Hx    Diabetes Neg Hx    Drug abuse Neg Hx    Early death Neg Hx    Hearing loss Neg Hx    Heart disease Neg Hx    Hyperlipidemia Neg Hx    Hypertension Neg Hx    Intellectual disability Neg Hx    Kidney disease Neg Hx    Learning disabilities Neg Hx    Miscarriages / Stillbirths Neg Hx    Stroke Neg Hx    Obesity Neg Hx    Vision loss Neg Hx    Varicose Veins Neg Hx     Social History   Socioeconomic History   Marital status: Single    Spouse name: Not on file   Number of children: Not on file   Years of education: Not on file   Highest education level: 10th grade  Occupational History   Occupation: Disabled  Tobacco Use   Smoking status: Never   Smokeless tobacco: Never  Vaping Use   Vaping Use: Never used  Substance and Sexual Activity   Alcohol use: Not Currently    Alcohol/week: 1.0 standard drink    Types: 1 Standard drinks or equivalent per week   Drug use: Not Currently    Frequency: 14.0 times per week    Types: Marijuana    Comment: pt reports 1-2 blunts on work days, more on days off   Sexual activity: Not Currently    Birth control/protection: None  Other Topics Concern   Not on file  Social History Narrative   Pt lives with mother in [place].  She has an upcoming appointment with [facility] (on June 2).     Social Determinants of Health   Financial Resource Strain: Not on file  Food Insecurity: Not on file  Transportation Needs: Not on file  Physical Activity: Not on file  Stress: Not  on file  Social Connections: Not on file  Intimate Partner Violence: Not on file    Review of Systems: As per HPI, all others negative  Physical Exam: Vital signs in last 24 hours:  Temp:  [97.9 F (36.6 C)-98.6 F (37 C)] 98.6 F (37 C) (09/26 1046) Pulse Rate:  [63-85] 63 (09/26 1046) Resp:  [14-21] 15 (09/26 1046) BP: (100-145)/(54-82) 107/67 (09/26 1046) SpO2:  [97 %-100 %] 100 % (09/26 1046) Weight:  [107 kg] 107 kg (09/26 0700)   General:   Alert,  Well-developed, well-nourished, pleasant and cooperative in NAD Head:  Normocephalic and atraumatic. Eyes:  Sclera clear, no icterus.   Conjunctiva pink. Ears:  Normal auditory acuity. Nose:  No deformity, discharge,  or lesions. Mouth:  No deformity or lesions.  Oropharynx pink & moist. Neck:  Supple; no masses or thyromegaly. Lungs:  Clear throughout to auscultation.   No wheezes, crackles, or rhonchi. No acute distress. Heart:  Regular rate and rhythm; no murmurs, clicks, rubs,  or gallops. Abdomen:  Soft, protuberant, nontender and nondistended. No masses, hepatosplenomegaly or hernias noted. Normal bowel sounds, without guarding, and without rebound.     Msk:  Symmetrical without gross deformities. Normal posture. Pulses:  Normal pulses noted. Extremities:  Without clubbing or edema. Neurologic:  Alert and  oriented x4;  grossly normal neurologically. Skin:  Intact without significant lesions or rashes. Cervical Nodes:  No significant cervical adenopathy. Psych:  Alert and cooperative. Normal mood and affect.   Lab Results: Recent Labs    05/01/21 0932 05/02/21 0401  WBC 5.4 4.1  HGB 9.3* 8.3*  HCT 33.0* 28.5*  PLT 496* 419*   BMET Recent Labs    05/01/21 0932 05/02/21 0401  NA 139 141  K 3.6 4.0  CL 103 111  CO2 27 27  GLUCOSE 91 96  BUN 7 6  CREATININE 0.73 0.67  CALCIUM 9.1 8.7*   LFT Recent Labs    05/02/21 0401  PROT 6.8  ALBUMIN 3.1*  AST 203*  ALT 308*  ALKPHOS 126  BILITOT 2.8*    PT/INR No results for input(s): LABPROT, INR in the last 72 hours.  Studies/Results: MR 3D Recon At Scanner  Result Date: 05/02/2021 : CLINICAL DATA: Abdominal pain, biliary obstruction suspected EXAM: MRI ABDOMEN WITH CONTRAST (WITH MRCP) TECHNIQUE: Multiplanar multisequence MR imaging of the abdomen was performed following the administration of intravenous contrast. Heavily T2-weighted images of the biliary and pancreatic ducts were obtained, and three-dimensional MRCP images were rendered by post processing. CONTRAST: 64mL GADAVIST GADOBUTROL 1 MMOL/ML IV SOLN COMPARISON: Same day abdominal ultrasound FINDINGS: Lower chest: No acute findings. Fluid signal cyst in the lateral right breast. Hepatobiliary: No mass or other parenchymal abnormality identified. Hepatic steatosis. Mild intra and extrahepatic biliary ductal dilatation, the common bile duct measuring up to 1.0 cm. Multiple gallstones in the gallbladder, several in or near the gallbladder neck. A calculus in the gallbladder neck measures 5 mm (series 3, image 19). There is a small gallstone in the central common bile duct near the ampulla measuring 4 mm (series 6, image 19). Suspect a second gallstone at the ampulla measuring 8 mm, difficult to discern from adjacent soft tissue (series 6, image 19, series 13, image 59). Pancreas: No mass, inflammatory changes, or other parenchymal abnormality identified. No pancreatic ductal dilatation. Spleen: Within normal limits in size and appearance. Adrenals/Urinary Tract: No masses identified. No evidence of hydronephrosis. Stomach/Bowel: Visualized portions within the abdomen are unremarkable. Vascular/Lymphatic: No pathologically enlarged lymph nodes identified. No abdominal aortic aneurysm demonstrated. Other: None. Musculoskeletal: No suspicious bone lesions identified. IMPRESSION: 1. Cholelithiasis. Multiple gallstones in the gallbladder, several in or near the gallbladder neck. A 5 mm gallstone in  the gallbladder neck measures 5 mm. No acute inflammatory findings of the gallbladder. 2. Choledocholithiasis. There is a 4 mm gallstone in the central common bile duct near the ampulla, and suspect a second gallstone at the ampulla measuring 8 mm, difficult to confidently distinguish from adjacent soft tissue. 3. Mild intra and extrahepatic biliary ductal dilatation, the common bile duct measuring up to 1.0 cm. 4. Hepatic steatosis. Electronically Signed By: Eddie Candle M.D. On: 05/01/2021 17:48 Electronically Signed   By: Eddie Candle M.D.   On: 05/02/2021 09:24   MR ABDOMEN WITH MRCP W CONTRAST  Result Date: 05/01/2021 CLINICAL DATA:  Abdominal pain, biliary obstruction suspected EXAM: MRI ABDOMEN WITH CONTRAST (WITH MRCP) TECHNIQUE: Multiplanar multisequence MR imaging of the abdomen was performed following the administration of intravenous contrast. Heavily T2-weighted images of the biliary and pancreatic ducts were obtained, and three-dimensional MRCP images were rendered by post processing. CONTRAST:  30mL GADAVIST GADOBUTROL 1 MMOL/ML IV SOLN COMPARISON:  Same day abdominal ultrasound FINDINGS: Lower chest: No acute findings. Fluid signal cyst in the lateral right breast. Hepatobiliary: No mass or other parenchymal abnormality identified. Hepatic steatosis. Mild intra and extrahepatic biliary ductal dilatation, the common bile duct measuring up to 1.0 cm. Multiple gallstones in the gallbladder, several in or near the gallbladder neck. A calculus in the gallbladder neck measures 5 mm (series 3, image 19). There is a small gallstone in the central common bile duct near the ampulla measuring 4 mm (series 6, image 19). Suspect a second gallstone at the ampulla measuring 8 mm, difficult to discern from adjacent soft tissue (series 6, image 19, series 13, image 59). Pancreas: No mass, inflammatory changes, or other parenchymal abnormality identified. No pancreatic ductal dilatation. Spleen:  Within normal  limits in size and appearance. Adrenals/Urinary Tract: No masses identified. No evidence of hydronephrosis. Stomach/Bowel: Visualized portions within the abdomen are unremarkable. Vascular/Lymphatic: No pathologically enlarged lymph nodes identified. No abdominal aortic aneurysm demonstrated. Other:  None. Musculoskeletal: No suspicious bone lesions identified. IMPRESSION: 1. Cholelithiasis. Multiple gallstones in the gallbladder, several in or near the gallbladder neck. A 5 mm gallstone in the gallbladder neck measures 5 mm. No acute inflammatory findings of the gallbladder. 2. Choledocholithiasis. There is a 4 mm gallstone in the central common bile duct near the ampulla, and suspect a second gallstone at the ampulla measuring 8 mm, difficult to confidently distinguish from adjacent soft tissue. 3. Mild intra and extrahepatic biliary ductal dilatation, the common bile duct measuring up to 1.0 cm. 4. Hepatic steatosis. Electronically Signed   By: Eddie Candle M.D.   On: 05/01/2021 17:48   US Abdomen Limited RUQ (LIVER/GB)  Result Date: 05/01/2021 CLINICAL DATA:  RIGHT upper quadrant pain EXAM: ULTRASOUND ABDOMEN LIMITED RIGHT UPPER QUADRANT COMPARISON:  04/29/2021 FINDINGS: Gallbladder: Cholelithiasis. No wall thickening visualized. No sonographic Murphy sign noted by sonographer. Common bile duct: Diameter: 7 mm, within normal limits Liver: No focal lesion identified. Diffusely increased hepatic parenchymal echogenicity. Portal vein is patent on color Doppler imaging with normal direction of blood flow towards the liver. Other: None. IMPRESSION: 1. Cholelithiasis without sonographic evidence of acute cholecystitis. 2. Hepatic steatosis. Electronically Signed   By: Valentino Saxon M.D.   On: 05/01/2021 10:29    Impression:   Symptomatic cholelithiasis.  Choledocholithiasis.  COVID + test.  Plan:   I have spoken with Infection Prevention Gordy Councilman).  Patient had two relatives test positive for  COVID one month ago (done [employer]) and had herself taken a home  test August 26th 2022 which was positive.  At that time, she was symptomatic with cough, sore throat, rhinorrhea.  She has completely recovered and has no respiratory complaints at this time.  Based on this information, and based on my discussion with Gordy Councilman, and since patient is currently asymptomatic, I do NOT feel she has active COVID and as a result I will discontinue her COVID precautions. ERCP planned, tomorrow or Wednesday (pending anesthesia availability), for anticipated biliary sphincterotomy and bile duct stone extraction. Risks (up to and including bleeding, infection, perforation, pancreatitis that can be complicated by infected necrosis and death), benefits (removal of stones, alleviating blockage, decreasing risk of cholangitis or choledocholithiasis-related pancreatitis), and alternatives (watchful waiting, percutaneous transhepatic cholangiography) of ERCP were explained to patient/family in detail and patient elects to proceed.    LOS: 1 day   Varun Jourdan M  05/02/2021, 2:33 PM  Cell [phone] If no answer or after 5 PM call [phone]

## 2021-05-02 NOTE — Progress Notes (Signed)
Subjective: CC: Patient reports epigastric/right upper quadrant abdominal pain that is severe and associated with nausea.  No emesis.  Patient noted to have positive COVID test.  She reports she tested positive for COVID about 1 month ago at [facility].  She does not have results with her on her phone and does not remember the exact date.  She lost her sister for these.  She denies any nasal congestion, cough, sore throat, loss of taste/smell, chest pain, shortness of breath.   Objective: Vital signs in last 24 hours: Temp:  [97.9 F (36.6 C)-98.3 F (36.8 C)] 98.3 F (36.8 C) (09/26 0430) Pulse Rate:  [65-101] 65 (09/26 0430) Resp:  [12-24] 14 (09/26 0430) BP: (100-145)/(54-88) 100/54 (09/26 0430) SpO2:  [97 %-100 %] 100 % (09/26 0430)    Intake/Output from previous day: 09/25 0701 - 09/26 0700 In: 1119.3 [I.V.:1119.3] Out: 5 [Urine:5] Intake/Output this shift: No intake/output data recorded.  PE: Gen:  Alert, NAD, pleasant HEENT: EOM's intact, pupils equal and round Card:  RRR Pulm:  CTAB, no W/R/R, effort normal Abd: Soft, ND, epigastric and ruq tenderness, +BS Ext:  No LE edema or calf tenderness Psych: A&Ox3  Skin: no rashes noted, warm and dry  Lab Results:  Recent Labs    05/01/21 0932 05/02/21 0401  WBC 5.4 4.1  HGB 9.3* 8.3*  HCT 33.0* 28.5*  PLT 496* 419*   BMET Recent Labs    05/01/21 0932 05/02/21 0401  NA 139 141  K 3.6 4.0  CL 103 111  CO2 27 27  GLUCOSE 91 96  BUN 7 6  CREATININE 0.73 0.67  CALCIUM 9.1 8.7*   PT/INR No results for input(s): LABPROT, INR in the last 72 hours. CMP     Component Value Date/Time   NA 141 05/02/2021 0401   K 4.0 05/02/2021 0401   CL 111 05/02/2021 0401   CO2 27 05/02/2021 0401   GLUCOSE 96 05/02/2021 0401   BUN 6 05/02/2021 0401   CREATININE 0.67 05/02/2021 0401   CALCIUM 8.7 (L) 05/02/2021 0401   PROT 6.8 05/02/2021 0401   ALBUMIN 3.1 (L) 05/02/2021 0401   AST 203 (H) 05/02/2021 0401   ALT 308  (H) 05/02/2021 0401   ALKPHOS 126 05/02/2021 0401   BILITOT 2.8 (H) 05/02/2021 0401   GFRNONAA >60 05/02/2021 0401   GFRAA >60 12/02/2019 0441   Lipase     Component Value Date/Time   LIPASE 28 05/01/2021 0932    Studies/Results: MR ABDOMEN WITH MRCP W CONTRAST  Result Date: 05/01/2021 CLINICAL DATA:  Abdominal pain, biliary obstruction suspected EXAM: MRI ABDOMEN WITH CONTRAST (WITH MRCP) TECHNIQUE: Multiplanar multisequence MR imaging of the abdomen was performed following the administration of intravenous contrast. Heavily T2-weighted images of the biliary and pancreatic ducts were obtained, and three-dimensional MRCP images were rendered by post processing. CONTRAST:  68mL GADAVIST GADOBUTROL 1 MMOL/ML IV SOLN COMPARISON:  Same day abdominal ultrasound FINDINGS: Lower chest: No acute findings. Fluid signal cyst in the lateral right breast. Hepatobiliary: No mass or other parenchymal abnormality identified. Hepatic steatosis. Mild intra and extrahepatic biliary ductal dilatation, the common bile duct measuring up to 1.0 cm. Multiple gallstones in the gallbladder, several in or near the gallbladder neck. A calculus in the gallbladder neck measures 5 mm (series 3, image 19). There is a small gallstone in the central common bile duct near the ampulla measuring 4 mm (series 6, image 19). Suspect a second gallstone at the ampulla  measuring 8 mm, difficult to discern from adjacent soft tissue (series 6, image 19, series 13, image 59). Pancreas: No mass, inflammatory changes, or other parenchymal abnormality identified. No pancreatic ductal dilatation. Spleen:  Within normal limits in size and appearance. Adrenals/Urinary Tract: No masses identified. No evidence of hydronephrosis. Stomach/Bowel: Visualized portions within the abdomen are unremarkable. Vascular/Lymphatic: No pathologically enlarged lymph nodes identified. No abdominal aortic aneurysm demonstrated. Other:  None. Musculoskeletal: No  suspicious bone lesions identified. IMPRESSION: 1. Cholelithiasis. Multiple gallstones in the gallbladder, several in or near the gallbladder neck. A 5 mm gallstone in the gallbladder neck measures 5 mm. No acute inflammatory findings of the gallbladder. 2. Choledocholithiasis. There is a 4 mm gallstone in the central common bile duct near the ampulla, and suspect a second gallstone at the ampulla measuring 8 mm, difficult to confidently distinguish from adjacent soft tissue. 3. Mild intra and extrahepatic biliary ductal dilatation, the common bile duct measuring up to 1.0 cm. 4. Hepatic steatosis. Electronically Signed   By: Eddie Candle M.D.   On: 05/01/2021 17:48   US Abdomen Limited RUQ (LIVER/GB)  Result Date: 05/01/2021 CLINICAL DATA:  RIGHT upper quadrant pain EXAM: ULTRASOUND ABDOMEN LIMITED RIGHT UPPER QUADRANT COMPARISON:  04/29/2021 FINDINGS: Gallbladder: Cholelithiasis. No wall thickening visualized. No sonographic Murphy sign noted by sonographer. Common bile duct: Diameter: 7 mm, within normal limits Liver: No focal lesion identified. Diffusely increased hepatic parenchymal echogenicity. Portal vein is patent on color Doppler imaging with normal direction of blood flow towards the liver. Other: None. IMPRESSION: 1. Cholelithiasis without sonographic evidence of acute cholecystitis. 2. Hepatic steatosis. Electronically Signed   By: Valentino Saxon M.D.   On: 05/01/2021 10:29    Anti-infectives: Anti-infectives (From admission, onward)    None        Assessment/Plan Cholelithiasis with choledocholithiasis - I have paged GI for consultation given findings of choledocholithiasis on MRCP and elevated LFTs. Likely will need ERCP - No evidence of acute cholecystitis on imaging.  WBC within normal limits.  Will hold off on antibiotics at the present time.  FEN - NPO, IVF VTE - SCDs, Lovenox ID - None  COVID + - patient reports that she had a positive test 1 month ago at [facility].  Unable  to see this in care everywhere.  I have asked her to obtain prior test so I can contact infection prevention and get her off precautions.  She is currently asymptomatic.   LOS: 1 day    Jillyn Ledger , [facility] 05/02/2021, 8:52 AM Please see Amion for pager number during day hours 7:00am-4:30pm

## 2021-05-02 NOTE — H&P (View-Only) (Signed)
[facility] Consultation Note  Referring Provider: [facility] Primary Care Physician:  Patient, No Pcp Per (Inactive)  Reason for Consultation:  Bile duct stone  HPI: Ladavia Lindenbaum is a 25 y.o. female with couple-month history of intermittent upper abdominal pain.  Worse recently, necessitating admission.  MRCP shows choledocholithiasis and cholelithiasis.  She has some nausea/vomiting; no fevers/chills or blood in stool.   Past Medical History:  Diagnosis Date   Depression    Fibroid    G6PD deficiency    Supervision of normal first pregnancy, antepartum 02/18/2018    Nursing Staff Provider Office Location  [facility] Dating  LMP/6 week [place]  Language   English  Anatomy US   Flu Vaccine  n/a Genetic Screen  NIPS: low risk, female  AFP:   TDaP vaccine    Hgb A1C or  GTT Early  Third trimester  Rhogam     LAB RESULTS  Feeding Plan Breast Blood Type A/Positive/-- (07/15 0942)  Contraception  Antibody Negative (07/15 0942) Circumcision  Rubella 3.00 (07/15 0942) Pediatricia    Past Surgical History:  Procedure Laterality Date   NO PAST SURGERIES      Prior to Admission medications   Medication Sig Start Date End Date Taking? Authorizing Provider  naproxen sodium (ALEVE) 220 MG tablet Take 440 mg by mouth daily as needed (pain).   Yes [provider]  ondansetron (ZOFRAN ODT) 4 MG disintegrating tablet Take 1 tablet (4 mg total) by mouth every 8 (eight) hours as needed for nausea. 04/29/21  Yes Larene Pickett, PA-C  oxyCODONE (ROXICODONE) 5 MG immediate release tablet Take 1 tablet (5 mg total) by mouth every 4 (four) hours as needed for severe pain. 04/29/21  Yes Larene Pickett, PA-C  FLUoxetine (PROZAC) 10 MG capsule Take 1 capsule (10 mg total) by mouth daily. Patient not taking: No sig reported 12/28/20   Freida Busman, MD  iron polysaccharides (NIFEREX) 150 MG capsule Take 1 capsule (150 mg total) by mouth daily. Patient not taking: No sig reported 12/29/20    Armando Reichert, MD  OLANZapine (ZYPREXA) 2.5 MG tablet Take 1 tablet (2.5 mg total) by mouth at bedtime. Patient not taking: No sig reported 12/29/20   Armando Reichert, MD  omeprazole (PRILOSEC) 20 MG capsule Take 1 capsule (20 mg total) by mouth daily for 7 days. Patient not taking: Reported on 03/06/2021 03/01/21 03/08/21  Janeece Fitting, PA-C  polyethylene glycol (MIRALAX / GLYCOLAX) 17 g packet Take 17 g by mouth daily as needed for mild constipation. Patient not taking: No sig reported 12/29/20   Armando Reichert, MD  triamcinolone (KENALOG) 0.1 % Apply 1 application topically 2 (two) times daily. Patient not taking: No sig reported 09/21/20   Margarita Mail, PA-C    Current Facility-Administered Medications  Medication Dose Route Frequency Provider Last Rate Last Admin   acetaminophen (TYLENOL) tablet 1,000 mg  1,000 mg Oral Q6H PRN Maczis, Barth Kirks, PA-C       dextrose 5 % and 0.9 % NaCl with KCl 20 mEq/L infusion   Intravenous Continuous Autumn Messing III, MD 100 mL/hr at 05/02/21 0432 100 mL/hr at 05/02/21 0432   [START ON 05/03/2021] enoxaparin (LOVENOX) injection 40 mg  40 mg Subcutaneous Q24H Maczis, Barth Kirks, PA-C       methocarbamol (ROBAXIN) 500 mg in dextrose 5 % 50 mL IVPB  500 mg Intravenous Q8H PRN Maczis, Barth Kirks, PA-C       morphine 2 MG/ML injection 2 mg  2 mg Intravenous Q2H PRN Jillyn Ledger, PA-C   2 mg at 05/02/21 1248   mupirocin ointment (BACTROBAN) 2 % 1 application  1 application Nasal BID Armandina Gemma, MD       ondansetron (ZOFRAN-ODT) disintegrating tablet 4 mg  4 mg Oral Q6H PRN Jovita Kussmaul, MD       Or   ondansetron [facility]) injection 4 mg  4 mg Intravenous Q6H PRN Autumn Messing III, MD   4 mg at 05/02/21 1244   pantoprazole (PROTONIX) injection 40 mg  40 mg Intravenous QHS Autumn Messing III, MD   40 mg at 05/01/21 2137    Allergies as of 05/01/2021 - Review Complete 05/01/2021  Allergen Reaction Noted   Bactrim [sulfamethoxazole-trimethoprim]  12/28/2020   Blue dyes  (parenteral) Other (See Comments) 09/16/2017   Codeine Other (See Comments) 09/16/2017   Ibuprofen  12/28/2020   Other Other (See Comments) 09/16/2017   Penicillins  12/28/2020    Family History  Problem Relation Age of Onset   Mental illness Mother    ADD / ADHD Neg Hx    Anxiety disorder Neg Hx    Alcohol abuse Neg Hx    Arthritis Neg Hx    Asthma Neg Hx    Cancer Neg Hx    Birth defects Neg Hx    Depression Neg Hx    COPD Neg Hx    Diabetes Neg Hx    Drug abuse Neg Hx    Early death Neg Hx    Hearing loss Neg Hx    Heart disease Neg Hx    Hyperlipidemia Neg Hx    Hypertension Neg Hx    Intellectual disability Neg Hx    Kidney disease Neg Hx    Learning disabilities Neg Hx    Miscarriages / Stillbirths Neg Hx    Stroke Neg Hx    Obesity Neg Hx    Vision loss Neg Hx    Varicose Veins Neg Hx     Social History   Socioeconomic History   Marital status: Single    Spouse name: Not on file   Number of children: Not on file   Years of education: Not on file   Highest education level: 10th grade  Occupational History   Occupation: Disabled  Tobacco Use   Smoking status: Never   Smokeless tobacco: Never  Vaping Use   Vaping Use: Never used  Substance and Sexual Activity   Alcohol use: Not Currently    Alcohol/week: 1.0 standard drink    Types: 1 Standard drinks or equivalent per week   Drug use: Not Currently    Frequency: 14.0 times per week    Types: Marijuana    Comment: pt reports 1-2 blunts on work days, more on days off   Sexual activity: Not Currently    Birth control/protection: None  Other Topics Concern   Not on file  Social History Narrative   Pt lives with mother in [place].  She has an upcoming appointment with [facility] (on June 2).     Social Determinants of Health   Financial Resource Strain: Not on file  Food Insecurity: Not on file  Transportation Needs: Not on file  Physical Activity: Not on file  Stress: Not  on file  Social Connections: Not on file  Intimate Partner Violence: Not on file    Review of Systems: As per HPI, all others negative  Physical Exam: Vital signs in last 24 hours:  Temp:  [97.9 F (36.6 C)-98.6 F (37 C)] 98.6 F (37 C) (09/26 1046) Pulse Rate:  [63-85] 63 (09/26 1046) Resp:  [14-21] 15 (09/26 1046) BP: (100-145)/(54-82) 107/67 (09/26 1046) SpO2:  [97 %-100 %] 100 % (09/26 1046) Weight:  [107 kg] 107 kg (09/26 0700)   General:   Alert,  Well-developed, well-nourished, pleasant and cooperative in NAD Head:  Normocephalic and atraumatic. Eyes:  Sclera clear, no icterus.   Conjunctiva pink. Ears:  Normal auditory acuity. Nose:  No deformity, discharge,  or lesions. Mouth:  No deformity or lesions.  Oropharynx pink & moist. Neck:  Supple; no masses or thyromegaly. Lungs:  Clear throughout to auscultation.   No wheezes, crackles, or rhonchi. No acute distress. Heart:  Regular rate and rhythm; no murmurs, clicks, rubs,  or gallops. Abdomen:  Soft, protuberant, nontender and nondistended. No masses, hepatosplenomegaly or hernias noted. Normal bowel sounds, without guarding, and without rebound.     Msk:  Symmetrical without gross deformities. Normal posture. Pulses:  Normal pulses noted. Extremities:  Without clubbing or edema. Neurologic:  Alert and  oriented x4;  grossly normal neurologically. Skin:  Intact without significant lesions or rashes. Cervical Nodes:  No significant cervical adenopathy. Psych:  Alert and cooperative. Normal mood and affect.   Lab Results: Recent Labs    05/01/21 0932 05/02/21 0401  WBC 5.4 4.1  HGB 9.3* 8.3*  HCT 33.0* 28.5*  PLT 496* 419*   BMET Recent Labs    05/01/21 0932 05/02/21 0401  NA 139 141  K 3.6 4.0  CL 103 111  CO2 27 27  GLUCOSE 91 96  BUN 7 6  CREATININE 0.73 0.67  CALCIUM 9.1 8.7*   LFT Recent Labs    05/02/21 0401  PROT 6.8  ALBUMIN 3.1*  AST 203*  ALT 308*  ALKPHOS 126  BILITOT 2.8*    PT/INR No results for input(s): LABPROT, INR in the last 72 hours.  Studies/Results: MR 3D Recon At Scanner  Result Date: 05/02/2021 : CLINICAL DATA: Abdominal pain, biliary obstruction suspected EXAM: MRI ABDOMEN WITH CONTRAST (WITH MRCP) TECHNIQUE: Multiplanar multisequence MR imaging of the abdomen was performed following the administration of intravenous contrast. Heavily T2-weighted images of the biliary and pancreatic ducts were obtained, and three-dimensional MRCP images were rendered by post processing. CONTRAST: 51mL GADAVIST GADOBUTROL 1 MMOL/ML IV SOLN COMPARISON: Same day abdominal ultrasound FINDINGS: Lower chest: No acute findings. Fluid signal cyst in the lateral right breast. Hepatobiliary: No mass or other parenchymal abnormality identified. Hepatic steatosis. Mild intra and extrahepatic biliary ductal dilatation, the common bile duct measuring up to 1.0 cm. Multiple gallstones in the gallbladder, several in or near the gallbladder neck. A calculus in the gallbladder neck measures 5 mm (series 3, image 19). There is a small gallstone in the central common bile duct near the ampulla measuring 4 mm (series 6, image 19). Suspect a second gallstone at the ampulla measuring 8 mm, difficult to discern from adjacent soft tissue (series 6, image 19, series 13, image 59). Pancreas: No mass, inflammatory changes, or other parenchymal abnormality identified. No pancreatic ductal dilatation. Spleen: Within normal limits in size and appearance. Adrenals/Urinary Tract: No masses identified. No evidence of hydronephrosis. Stomach/Bowel: Visualized portions within the abdomen are unremarkable. Vascular/Lymphatic: No pathologically enlarged lymph nodes identified. No abdominal aortic aneurysm demonstrated. Other: None. Musculoskeletal: No suspicious bone lesions identified. IMPRESSION: 1. Cholelithiasis. Multiple gallstones in the gallbladder, several in or near the gallbladder neck. A 5 mm gallstone in  the gallbladder neck measures 5 mm. No acute inflammatory findings of the gallbladder. 2. Choledocholithiasis. There is a 4 mm gallstone in the central common bile duct near the ampulla, and suspect a second gallstone at the ampulla measuring 8 mm, difficult to confidently distinguish from adjacent soft tissue. 3. Mild intra and extrahepatic biliary ductal dilatation, the common bile duct measuring up to 1.0 cm. 4. Hepatic steatosis. Electronically Signed By: Eddie Candle M.D. On: 05/01/2021 17:48 Electronically Signed   By: Eddie Candle M.D.   On: 05/02/2021 09:24   MR ABDOMEN WITH MRCP W CONTRAST  Result Date: 05/01/2021 CLINICAL DATA:  Abdominal pain, biliary obstruction suspected EXAM: MRI ABDOMEN WITH CONTRAST (WITH MRCP) TECHNIQUE: Multiplanar multisequence MR imaging of the abdomen was performed following the administration of intravenous contrast. Heavily T2-weighted images of the biliary and pancreatic ducts were obtained, and three-dimensional MRCP images were rendered by post processing. CONTRAST:  9mL GADAVIST GADOBUTROL 1 MMOL/ML IV SOLN COMPARISON:  Same day abdominal ultrasound FINDINGS: Lower chest: No acute findings. Fluid signal cyst in the lateral right breast. Hepatobiliary: No mass or other parenchymal abnormality identified. Hepatic steatosis. Mild intra and extrahepatic biliary ductal dilatation, the common bile duct measuring up to 1.0 cm. Multiple gallstones in the gallbladder, several in or near the gallbladder neck. A calculus in the gallbladder neck measures 5 mm (series 3, image 19). There is a small gallstone in the central common bile duct near the ampulla measuring 4 mm (series 6, image 19). Suspect a second gallstone at the ampulla measuring 8 mm, difficult to discern from adjacent soft tissue (series 6, image 19, series 13, image 59). Pancreas: No mass, inflammatory changes, or other parenchymal abnormality identified. No pancreatic ductal dilatation. Spleen:  Within normal  limits in size and appearance. Adrenals/Urinary Tract: No masses identified. No evidence of hydronephrosis. Stomach/Bowel: Visualized portions within the abdomen are unremarkable. Vascular/Lymphatic: No pathologically enlarged lymph nodes identified. No abdominal aortic aneurysm demonstrated. Other:  None. Musculoskeletal: No suspicious bone lesions identified. IMPRESSION: 1. Cholelithiasis. Multiple gallstones in the gallbladder, several in or near the gallbladder neck. A 5 mm gallstone in the gallbladder neck measures 5 mm. No acute inflammatory findings of the gallbladder. 2. Choledocholithiasis. There is a 4 mm gallstone in the central common bile duct near the ampulla, and suspect a second gallstone at the ampulla measuring 8 mm, difficult to confidently distinguish from adjacent soft tissue. 3. Mild intra and extrahepatic biliary ductal dilatation, the common bile duct measuring up to 1.0 cm. 4. Hepatic steatosis. Electronically Signed   By: Eddie Candle M.D.   On: 05/01/2021 17:48   US Abdomen Limited RUQ (LIVER/GB)  Result Date: 05/01/2021 CLINICAL DATA:  RIGHT upper quadrant pain EXAM: ULTRASOUND ABDOMEN LIMITED RIGHT UPPER QUADRANT COMPARISON:  04/29/2021 FINDINGS: Gallbladder: Cholelithiasis. No wall thickening visualized. No sonographic Murphy sign noted by sonographer. Common bile duct: Diameter: 7 mm, within normal limits Liver: No focal lesion identified. Diffusely increased hepatic parenchymal echogenicity. Portal vein is patent on color Doppler imaging with normal direction of blood flow towards the liver. Other: None. IMPRESSION: 1. Cholelithiasis without sonographic evidence of acute cholecystitis. 2. Hepatic steatosis. Electronically Signed   By: Valentino Saxon M.D.   On: 05/01/2021 10:29    Impression:   Symptomatic cholelithiasis.  Choledocholithiasis.  COVID + test.  Plan:   I have spoken with Infection Prevention Gordy Councilman).  Patient had two relatives test positive for  COVID one month ago (done [employer]) and had herself taken a home  test August 26th 2022 which was positive.  At that time, she was symptomatic with cough, sore throat, rhinorrhea.  She has completely recovered and has no respiratory complaints at this time.  Based on this information, and based on my discussion with Gordy Councilman, and since patient is currently asymptomatic, I do NOT feel she has active COVID and as a result I will discontinue her COVID precautions. ERCP planned, tomorrow or Wednesday (pending anesthesia availability), for anticipated biliary sphincterotomy and bile duct stone extraction. Risks (up to and including bleeding, infection, perforation, pancreatitis that can be complicated by infected necrosis and death), benefits (removal of stones, alleviating blockage, decreasing risk of cholangitis or choledocholithiasis-related pancreatitis), and alternatives (watchful waiting, percutaneous transhepatic cholangiography) of ERCP were explained to patient/family in detail and patient elects to proceed.    LOS: 1 day   Mohanad Carsten M  05/02/2021, 2:33 PM  Cell [phone] If no answer or after 5 PM call [phone]

## 2021-05-03 ENCOUNTER — Inpatient Hospital Stay (HOSPITAL_COMMUNITY): Payer: Medicaid Other | Admitting: Certified Registered"

## 2021-05-03 ENCOUNTER — Inpatient Hospital Stay (HOSPITAL_COMMUNITY): Payer: Medicaid Other

## 2021-05-03 ENCOUNTER — Encounter (HOSPITAL_COMMUNITY): Admission: EM | Disposition: A | Discharge status: Home / Self Care | Payer: Self-pay | Source: Home / Self Care

## 2021-05-03 ENCOUNTER — Encounter (HOSPITAL_COMMUNITY): Payer: Self-pay

## 2021-05-03 HISTORY — PX: SPHINCTEROTOMY: SHX5544

## 2021-05-03 HISTORY — PX: PANCREATIC STENT PLACEMENT: SHX5539

## 2021-05-03 HISTORY — PX: REMOVAL OF STONES: SHX5545

## 2021-05-03 HISTORY — PX: ERCP: SHX5425

## 2021-05-03 LAB — COMPREHENSIVE METABOLIC PANEL
ALT: 332 U/L — ABNORMAL HIGH (ref 0–44)
AST: 236 U/L — ABNORMAL HIGH (ref 15–41)
Albumin: 3.2 g/dL — ABNORMAL LOW (ref 3.5–5.0)
Alkaline Phosphatase: 129 U/L — ABNORMAL HIGH (ref 38–126)
Anion gap: 5 (ref 5–15)
BUN: <5 mg/dL — ABNORMAL LOW (ref 6–20)
CO2: 26 mmol/L (ref 22–32)
Calcium: 9 mg/dL (ref 8.9–10.3)
Chloride: 107 mmol/L (ref 98–111)
Creatinine, Ser: 0.6 mg/dL (ref 0.44–1.00)
GFR, Estimated: >60 mL/min (ref >60)
Glucose, Bld: 104 mg/dL — ABNORMAL HIGH (ref 70–99)
Potassium: 3.4 mmol/L — ABNORMAL LOW (ref 3.5–5.1)
Sodium: 138 mmol/L (ref 135–145)
Total Bilirubin: 3.4 mg/dL — ABNORMAL HIGH (ref 0.3–1.2)
Total Protein: 7.4 g/dL (ref 6.5–8.1)

## 2021-05-03 LAB — CBC
HCT: 31.4 % — ABNORMAL LOW (ref 36.0–46.0)
Hemoglobin: 9.3 g/dL — ABNORMAL LOW (ref 12.0–15.0)
MCH: 24.1 pg — ABNORMAL LOW (ref 26.0–34.0)
MCHC: 29.6 g/dL — ABNORMAL LOW (ref 30.0–36.0)
MCV: 81.3 fL (ref 80.0–100.0)
Platelets: 464 10*3/uL — ABNORMAL HIGH (ref 150–400)
RBC: 3.86 MIL/uL — ABNORMAL LOW (ref 3.87–5.11)
RDW: 16 % — ABNORMAL HIGH (ref 11.5–15.5)
WBC: 3.6 10*3/uL — ABNORMAL LOW (ref 4.0–10.5)
nRBC: 0 % (ref 0.0–0.2)

## 2021-05-03 SURGERY — ERCP, WITH INTERVENTION IF INDICATED
Anesthesia: General

## 2021-05-03 MED ORDER — GLUCAGON HCL RDNA (DIAGNOSTIC) 1 MG IJ SOLR
INTRAMUSCULAR | Status: AC
  Filled 2021-05-03: qty 1

## 2021-05-03 MED ORDER — INDOMETHACIN 50 MG RE SUPP
RECTAL | Status: AC
  Filled 2021-05-03: qty 2

## 2021-05-03 MED ORDER — CIPROFLOXACIN IN D5W 400 MG/200ML IV SOLN
INTRAVENOUS | Status: AC
  Filled 2021-05-03: qty 200

## 2021-05-03 MED ORDER — SUGAMMADEX SODIUM 200 MG/2ML IV SOLN
INTRAVENOUS | Status: DC | PRN
  Administered 2021-05-03: 300 mg via INTRAVENOUS

## 2021-05-03 MED ORDER — LIDOCAINE 2% (20 MG/ML) 5 ML SYRINGE
INTRAMUSCULAR | Status: DC | PRN
  Administered 2021-05-03: 40 mg via INTRAVENOUS

## 2021-05-03 MED ORDER — PHENYLEPHRINE 40 MCG/ML (10ML) SYRINGE FOR IV PUSH (FOR BLOOD PRESSURE SUPPORT)
PREFILLED_SYRINGE | INTRAVENOUS | Status: DC | PRN
  Administered 2021-05-03: 40 ug via INTRAVENOUS
  Administered 2021-05-03: 80 ug via INTRAVENOUS

## 2021-05-03 MED ORDER — DEXAMETHASONE SODIUM PHOSPHATE 10 MG/ML IJ SOLN
INTRAMUSCULAR | Status: DC | PRN
  Administered 2021-05-03: 4 mg via INTRAVENOUS

## 2021-05-03 MED ORDER — FENTANYL CITRATE (PF) 250 MCG/5ML IJ SOLN
INTRAMUSCULAR | Status: DC | PRN
  Administered 2021-05-03 (×3): 50 ug via INTRAVENOUS

## 2021-05-03 MED ORDER — LACTATED RINGERS IV SOLN
INTRAVENOUS | Status: AC | PRN
  Administered 2021-05-03: 1000 mL via INTRAVENOUS

## 2021-05-03 MED ORDER — PROPOFOL 10 MG/ML IV BOLUS
INTRAVENOUS | Status: DC | PRN
  Administered 2021-05-03: 200 mg via INTRAVENOUS

## 2021-05-03 MED ORDER — PROMETHAZINE HCL 25 MG/ML IJ SOLN
INTRAMUSCULAR | Status: AC
  Filled 2021-05-03: qty 1

## 2021-05-03 MED ORDER — FENTANYL CITRATE (PF) 100 MCG/2ML IJ SOLN
INTRAMUSCULAR | Status: AC
  Filled 2021-05-03: qty 2

## 2021-05-03 MED ORDER — ROCURONIUM BROMIDE 10 MG/ML (PF) SYRINGE
PREFILLED_SYRINGE | INTRAVENOUS | Status: DC | PRN
  Administered 2021-05-03: 50 mg via INTRAVENOUS

## 2021-05-03 MED ORDER — PROPOFOL 10 MG/ML IV BOLUS
INTRAVENOUS | Status: AC
  Filled 2021-05-03: qty 40

## 2021-05-03 MED ORDER — ESMOLOL HCL 100 MG/10ML IV SOLN
INTRAVENOUS | Status: DC | PRN
  Administered 2021-05-03: 30 mg via INTRAVENOUS

## 2021-05-03 MED ORDER — SODIUM CHLORIDE 0.9 % IV SOLN
6.2500 mg | Freq: Once | INTRAVENOUS | Status: AC
  Administered 2021-05-03: 6.25 mg via INTRAVENOUS
  Filled 2021-05-03: qty 0.25

## 2021-05-03 MED ORDER — CIPROFLOXACIN IN D5W 400 MG/200ML IV SOLN: 400.0000 mg | INTRAVENOUS | Status: DC

## 2021-05-03 MED ORDER — GLUCAGON HCL RDNA (DIAGNOSTIC) 1 MG IJ SOLR
INTRAMUSCULAR | Status: DC | PRN
  Administered 2021-05-03: .5 mg via INTRAVENOUS

## 2021-05-03 MED ORDER — LACTATED RINGERS IV SOLN: INTRAVENOUS | Status: DC

## 2021-05-03 MED ORDER — SODIUM CHLORIDE 0.9 % IV SOLN
INTRAVENOUS | Status: DC | PRN
  Administered 2021-05-03: 25 mL

## 2021-05-03 MED ORDER — INDOMETHACIN 50 MG RE SUPP
RECTAL | Status: DC | PRN
  Administered 2021-05-03: 100 mg via RECTAL

## 2021-05-03 NOTE — Anesthesia Postprocedure Evaluation (Signed)
Anesthesia Post Note  Patient: Sandra Macdonald  Procedure(s) Performed: ENDOSCOPIC RETROGRADE CHOLANGIOPANCREATOGRAPHY (ERCP) REMOVAL OF STONES SPHINCTEROTOMY PANCREATIC STENT PLACEMENT     Patient location during evaluation: Endoscopy Anesthesia Type: General Level of consciousness: awake and alert Pain management: pain level controlled Vital Signs Assessment: post-procedure vital signs reviewed and stable Respiratory status: spontaneous breathing, nonlabored ventilation and respiratory function stable Cardiovascular status: blood pressure returned to baseline and stable Anesthetic complications: no   No notable events documented.  Last Vitals:  Vitals:   05/03/21 1220 05/03/21 1225  BP: (!) 145/83 (!) 145/82  Pulse: (!) 58 (!) 58  Resp: 10 13  Temp:    SpO2: 97% 100%    Last Pain:  Vitals:   05/03/21 1144  TempSrc: Oral  PainSc: 0-No pain                 Alvin Rubano,W. EDMOND

## 2021-05-03 NOTE — Progress Notes (Signed)
Subjective: CC: Sandra Macdonald states symptoms have overall improved - still with some nausea and abdominal pain but better than yesterday. No emesis. Ambulating. Passing flatus  Mom and sister bedside  Objective: Vital signs in last 24 hours: Temp:  [98.1 F (36.7 C)-98.7 F (37.1 C)] 98.1 F (36.7 C) (09/27 0642) Pulse Rate:  [56-79] 56 (09/27 0642) Resp:  [15-18] 18 (09/27 0642) BP: (107-147)/(66-87) 110/71 (09/27 0642) SpO2:  [96 %-100 %] 100 % (09/27 0642)    Intake/Output from previous day: 09/26 0701 - 09/27 0700 In: 2406.2 [P.O.:480; I.V.:1899.6; IV Piggyback:26.6] Out: 801 [Urine:801] Intake/Output this shift: No intake/output data recorded.  PE: Gen:  Alert, NAD, pleasant HEENT: EOM's intact, pupils equal and round Card:  RRR Pulm:  CTAB, no W/R/R, effort normal Abd: Soft, ND, mild epigastric and ruq tenderness to palpation, +BS Ext:  No LE edema or calf tenderness Psych: A&Ox3  Skin: no rashes noted, warm and dry  Lab Results:  Recent Labs    05/02/21 0401 05/03/21 0443  WBC 4.1 3.6*  HGB 8.3* 9.3*  HCT 28.5* 31.4*  PLT 419* 464*    BMET Recent Labs    05/02/21 0401 05/03/21 0443  NA 141 138  K 4.0 3.4*  CL 111 107  CO2 27 26  GLUCOSE 96 104*  BUN 6 <5*  CREATININE 0.67 0.60  CALCIUM 8.7* 9.0    PT/INR No results for input(s): LABPROT, INR in the last 72 hours. CMP     Component Value Date/Time   NA 138 05/03/2021 0443   K 3.4 (L) 05/03/2021 0443   CL 107 05/03/2021 0443   CO2 26 05/03/2021 0443   GLUCOSE 104 (H) 05/03/2021 0443   BUN <5 (L) 05/03/2021 0443   CREATININE 0.60 05/03/2021 0443   CALCIUM 9.0 05/03/2021 0443   PROT 7.4 05/03/2021 0443   ALBUMIN 3.2 (L) 05/03/2021 0443   AST 236 (H) 05/03/2021 0443   ALT 332 (H) 05/03/2021 0443   ALKPHOS 129 (H) 05/03/2021 0443   BILITOT 3.4 (H) 05/03/2021 0443   GFRNONAA >60 05/03/2021 0443   GFRAA >60 12/02/2019 0441   Lipase     Component Value Date/Time   LIPASE 28 05/01/2021  0932    Studies/Results: MR 3D Recon At Scanner  Result Date: 05/02/2021 : CLINICAL DATA: Abdominal pain, biliary obstruction suspected EXAM: MRI ABDOMEN WITH CONTRAST (WITH MRCP) TECHNIQUE: Multiplanar multisequence MR imaging of the abdomen was performed following the administration of intravenous contrast. Heavily T2-weighted images of the biliary and pancreatic ducts were obtained, and three-dimensional MRCP images were rendered by post processing. CONTRAST: 65mL GADAVIST GADOBUTROL 1 MMOL/ML IV SOLN COMPARISON: Same day abdominal ultrasound FINDINGS: Lower chest: No acute findings. Fluid signal cyst in the lateral right breast. Hepatobiliary: No mass or other parenchymal abnormality identified. Hepatic steatosis. Mild intra and extrahepatic biliary ductal dilatation, the common bile duct measuring up to 1.0 cm. Multiple gallstones in the gallbladder, several in or near the gallbladder neck. A calculus in the gallbladder neck measures 5 mm (series 3, image 19). There is a small gallstone in the central common bile duct near the ampulla measuring 4 mm (series 6, image 19). Suspect a second gallstone at the ampulla measuring 8 mm, difficult to discern from adjacent soft tissue (series 6, image 19, series 13, image 59). Pancreas: No mass, inflammatory changes, or other parenchymal abnormality identified. No pancreatic ductal dilatation. Spleen: Within normal limits in size and appearance. Adrenals/Urinary Tract: No masses identified. No evidence of  hydronephrosis. Stomach/Bowel: Visualized portions within the abdomen are unremarkable. Vascular/Lymphatic: No pathologically enlarged lymph nodes identified. No abdominal aortic aneurysm demonstrated. Other: None. Musculoskeletal: No suspicious bone lesions identified. IMPRESSION: 1. Cholelithiasis. Multiple gallstones in the gallbladder, several in or near the gallbladder neck. A 5 mm gallstone in the gallbladder neck measures 5 mm. No acute inflammatory findings  of the gallbladder. 2. Choledocholithiasis. There is a 4 mm gallstone in the central common bile duct near the ampulla, and suspect a second gallstone at the ampulla measuring 8 mm, difficult to confidently distinguish from adjacent soft tissue. 3. Mild intra and extrahepatic biliary ductal dilatation, the common bile duct measuring up to 1.0 cm. 4. Hepatic steatosis. Electronically Signed By: Eddie Candle M.D. On: 05/01/2021 17:48 Electronically Signed   By: Eddie Candle M.D.   On: 05/02/2021 09:24   MR ABDOMEN WITH MRCP W CONTRAST  Result Date: 05/01/2021 CLINICAL DATA:  Abdominal pain, biliary obstruction suspected EXAM: MRI ABDOMEN WITH CONTRAST (WITH MRCP) TECHNIQUE: Multiplanar multisequence MR imaging of the abdomen was performed following the administration of intravenous contrast. Heavily T2-weighted images of the biliary and pancreatic ducts were obtained, and three-dimensional MRCP images were rendered by post processing. CONTRAST:  40mL GADAVIST GADOBUTROL 1 MMOL/ML IV SOLN COMPARISON:  Same day abdominal ultrasound FINDINGS: Lower chest: No acute findings. Fluid signal cyst in the lateral right breast. Hepatobiliary: No mass or other parenchymal abnormality identified. Hepatic steatosis. Mild intra and extrahepatic biliary ductal dilatation, the common bile duct measuring up to 1.0 cm. Multiple gallstones in the gallbladder, several in or near the gallbladder neck. A calculus in the gallbladder neck measures 5 mm (series 3, image 19). There is a small gallstone in the central common bile duct near the ampulla measuring 4 mm (series 6, image 19). Suspect a second gallstone at the ampulla measuring 8 mm, difficult to discern from adjacent soft tissue (series 6, image 19, series 13, image 59). Pancreas: No mass, inflammatory changes, or other parenchymal abnormality identified. No pancreatic ductal dilatation. Spleen:  Within normal limits in size and appearance. Adrenals/Urinary Tract: No masses  identified. No evidence of hydronephrosis. Stomach/Bowel: Visualized portions within the abdomen are unremarkable. Vascular/Lymphatic: No pathologically enlarged lymph nodes identified. No abdominal aortic aneurysm demonstrated. Other:  None. Musculoskeletal: No suspicious bone lesions identified. IMPRESSION: 1. Cholelithiasis. Multiple gallstones in the gallbladder, several in or near the gallbladder neck. A 5 mm gallstone in the gallbladder neck measures 5 mm. No acute inflammatory findings of the gallbladder. 2. Choledocholithiasis. There is a 4 mm gallstone in the central common bile duct near the ampulla, and suspect a second gallstone at the ampulla measuring 8 mm, difficult to confidently distinguish from adjacent soft tissue. 3. Mild intra and extrahepatic biliary ductal dilatation, the common bile duct measuring up to 1.0 cm. 4. Hepatic steatosis. Electronically Signed   By: Eddie Candle M.D.   On: 05/01/2021 17:48   US Abdomen Limited RUQ (LIVER/GB)  Result Date: 05/01/2021 CLINICAL DATA:  RIGHT upper quadrant pain EXAM: ULTRASOUND ABDOMEN LIMITED RIGHT UPPER QUADRANT COMPARISON:  04/29/2021 FINDINGS: Gallbladder: Cholelithiasis. No wall thickening visualized. No sonographic Murphy sign noted by sonographer. Common bile duct: Diameter: 7 mm, within normal limits Liver: No focal lesion identified. Diffusely increased hepatic parenchymal echogenicity. Portal vein is patent on color Doppler imaging with normal direction of blood flow towards the liver. Other: None. IMPRESSION: 1. Cholelithiasis without sonographic evidence of acute cholecystitis. 2. Hepatic steatosis. Electronically Signed   By: Valentino Saxon M.D.   On:  05/01/2021 10:29    Anti-infectives: Anti-infectives (From admission, onward)    Start     Dose/Rate Route Frequency Ordered Stop   05/03/21 0600  ciprofloxacin (CIPRO) IVPB 400 mg        400 mg 200 mL/hr over 60 Minutes Intravenous On call 05/02/21 1712 05/04/21 0600         Assessment/Plan Cholelithiasis with choledocholithiasis -GI has seen with plans for ERCP hopefully today - No evidence of acute cholecystitis on imaging.  WBC within normal limits.  Will hold off on antibiotics at the present time. - plan for lap chole tomorrow I have explained the procedure, risks, and aftercare of cholecystectomy.  Risks include but are not limited to bleeding, infection, wound problems, anesthesia, diarrhea, bile leak, injury to common bile duct/liver/intestine.  Sandra Macdonald seems to understand and agrees to proceed.  FEN - NPO, IVF VTE - SCDs, Lovenox ID - None  COVID + - positive home test 1 mo ago. Asymptomatic. Off precautions 9/26.   LOS: 2 days    [facility] 05/03/2021, 7:33 AM Please see Amion for pager number during day hours 7:00am-4:30pm

## 2021-05-03 NOTE — Transfer of Care (Signed)
Immediate Anesthesia Transfer of Care Note  Patient: Sandra Macdonald  Procedure(s) Performed: ENDOSCOPIC RETROGRADE CHOLANGIOPANCREATOGRAPHY (ERCP) REMOVAL OF STONES SPHINCTEROTOMY PANCREATIC STENT PLACEMENT  Patient Location: PACU and Endoscopy Unit  Anesthesia Type:General  Level of Consciousness: awake, alert  and patient cooperative  Airway & Oxygen Therapy: Patient Spontanous Breathing and Patient connected to face mask oxygen  Post-op Assessment: Report given to RN and Post -op Vital signs reviewed and stable  Post vital signs: Reviewed and stable  Last Vitals:  Vitals Value Taken Time  BP    Temp    Pulse    Resp    SpO2      Last Pain:  Vitals:   05/03/21 0937  TempSrc: Oral  PainSc: 0-No pain      Patients Stated Pain Goal: 5 (28/24/17 5301)  Complications: No notable events documented.

## 2021-05-03 NOTE — Anesthesia Procedure Notes (Signed)
Procedure Name: Intubation Date/Time: 05/03/2021 10:21 AM Performed by: Eben Burow, CRNA Pre-anesthesia Checklist: Patient identified, Emergency Drugs available, Suction available, Patient being monitored and Timeout performed Patient Re-evaluated:Patient Re-evaluated prior to induction Oxygen Delivery Method: Circle system utilized Preoxygenation: Pre-oxygenation with 100% oxygen Induction Type: IV induction Ventilation: Mask ventilation without difficulty Laryngoscope Size: Mac and 4 Grade View: Grade I Tube type: Oral Tube size: 7.0 mm Number of attempts: 1 Airway Equipment and Method: Stylet Placement Confirmation: ETT inserted through vocal cords under direct vision, positive ETCO2 and breath sounds checked- equal and bilateral Secured at: 21 cm Tube secured with: Tape Dental Injury: Teeth and Oropharynx as per pre-operative assessment

## 2021-05-03 NOTE — Interval H&P Note (Signed)
History and Physical Interval Note:  05/03/2021 10:07 AM  Sandra Macdonald  has presented today for surgery, with the diagnosis of Choledocholithiasis.  The various methods of treatment have been discussed with the patient and family. After consideration of risks, benefits and other options for treatment, the patient has consented to  Procedure(s): ENDOSCOPIC RETROGRADE CHOLANGIOPANCREATOGRAPHY (ERCP) (N/A) as a surgical intervention.  The patient's history has been reviewed, patient examined, no change in status, stable for surgery.  I have reviewed the patient's chart and labs.  Questions were answered to the patient's satisfaction.     Landry Dyke

## 2021-05-03 NOTE — OR Nursing (Signed)
Pt vomited small amount of dark yellow liquid. Notified Anesthesiologist. Received order for 6.25 mg Phenergan IV. Given per order.

## 2021-05-03 NOTE — Op Note (Signed)
[facility] Patient Name: Sandra Macdonald Procedure Date: 05/03/2021 MRN: [id number] Attending MD: Arta Silence , MD Date of Birth: [birth year] CSN: [id number] Age: 25 Admit Type: Inpatient Procedure:                ERCP Indications:              Abdominal pain of suspected biliary origin, Bile                            duct stone(s), Jaundice, Elevated liver enzymes Providers:                Arta Silence, MD, Jeanella Cara, RN,                            Tyrone Apple, Technician, Luan Moore,                            Technician, Jefm Miles CRNA Referring MD:             [facility] Medicines:                Monitored Anesthesia Care, General Anesthesia,                            Cipro 400 mg IV, Indomethacin 100 mg PR, Glucagon                            0.5 mg IV Complications:            No immediate complications. Estimated Blood Loss:     Estimated blood loss: none. Procedure:                Pre-Anesthesia Assessment:                           - Prior to the procedure, a History and Physical                            was performed, and patient medications and                            allergies were reviewed. The patient's tolerance of                            previous anesthesia was also reviewed. The risks                            and benefits of the procedure and the sedation                            options and risks were discussed with the patient.                            All questions were answered, and informed consent  was obtained. Prior Anticoagulants: The patient has                            taken no previous anticoagulant or antiplatelet                            agents. ASA Grade Assessment: II - A patient with                            mild systemic disease. After reviewing the risks                            and benefits, the patient was deemed in                             satisfactory condition to undergo the procedure.                           After obtaining informed consent, the scope was                            passed under direct vision. Throughout the                            procedure, the patient's blood pressure, pulse, and                            oxygen saturations were monitored continuously. The                            TJF-Q180V ([id number] Olympus duodenoscope was                            introduced through the mouth, and used to inject                            contrast into and used to inject contrast into the                            bile duct. The ERCP was accomplished without                            difficulty. The patient tolerated the procedure                            well. Scope In: Scope Out: Findings:      The scout film was normal. The esophagus was successfully intubated       under direct vision. The scope was advanced to a normal major papilla in       the descending duodenum without detailed examination of the pharynx,       larynx and associated structures, and upper GI tract. The upper GI tract       was grossly normal. The major papilla was normal. Preferential  cannulation of pancreatic duct, necessitating placement of one 5 Fr by 5       cm plastic stent with a full external pigtail and a single internal flap       was placed 5 cm into the ventral pancreatic duct. Clear fluid flowed       through the stent. The stent was in good position. Needle knife       sphincterotomy done 3-4 mm over the pancreatic duct stent, with       subsequent immediate cannulation of bile duct. Sphincterotomy was       extended to 8 mm total with a traction (standard) sphincterotome using       blended current. There was no post-sphincterotomy bleeding. The middle       third of the main bile duct contained filling defect(s) thought to be a       stone. The biliary tree was swept with a basket starting at the upper        third of the main bile duct, middle third of the main bile duct and       lower third of the main duct. Two stones were removed. No stones       remained. Pancreatogram was not obtained (by intention). Impression:               - The major papilla appeared normal.                           - Two filling defects consistent with stones were                            seen on the cholangiogram.                           - Choledocholithiasis was found. Complete removal                            was accomplished by biliary sphincterotomy and                            basket extraction.                           - One plastic stent was placed into the ventral                            pancreatic duct.                           - A biliary sphincterotomy was performed. Moderate Sedation:      Not Applicable - Patient had care per Anesthesia. Recommendation:           - Avoid aspirin and nonsteroidal anti-inflammatory                            medicines for 3 days.                           - Clear liquid diet today.                           -  Continue present medications.                           - Follow up abd xray in 3-4 weeks to ensure                            pancreatic duct stent passage.                           - [facility] will follow.                           - Anticipated cholecystectomy with [facility] this                            admission. Procedure Code(s):        --- Professional ---                           [phone], Endoscopic retrograde                            cholangiopancreatography (ERCP); with placement of                            endoscopic stent into biliary or pancreatic duct,                            including pre- and post-dilation and guide wire                            passage, when performed, including sphincterotomy,                            when performed, each stent                           43264, Endoscopic retrograde                             cholangiopancreatography (ERCP); with removal of                            calculi/debris from biliary/pancreatic duct(s)                           43262, 59, Endoscopic retrograde                            cholangiopancreatography (ERCP); with                            sphincterotomy/papillotomy Diagnosis Code(s):        --- Professional ---                           K80.50, Calculus of bile duct without cholangitis  or cholecystitis without obstruction                           R10.9, Unspecified abdominal pain                           R17, Unspecified jaundice                           R74.8, Abnormal levels of other serum enzymes                           R93.2, Abnormal findings on diagnostic imaging of                            liver and biliary tract CPT copyright 2019 [employer]. All rights reserved. The codes documented in this report are preliminary and upon coder review may  be revised to meet current compliance requirements. Arta Silence, MD 05/03/2021 11:41:55 AM This report has been signed electronically. Number of Addenda: 0

## 2021-05-03 NOTE — Anesthesia Preprocedure Evaluation (Addendum)
Anesthesia Evaluation  Patient identified by MRN, date of birth, ID band Patient awake    Reviewed: Allergy & Precautions, H&P , NPO status , Patient's Chart, lab work & pertinent test results  Airway Mallampati: III  TM Distance: >3 FB Neck ROM: Full    Dental no notable dental hx. (+) Teeth Intact, Dental Advisory Given   Pulmonary neg pulmonary ROS,    Pulmonary exam normal breath sounds clear to auscultation       Cardiovascular negative cardio ROS   Rhythm:Regular Rate:Normal     Neuro/Psych Depression negative neurological ROS     GI/Hepatic negative GI ROS, Neg liver ROS,   Endo/Other  Morbid obesity  Renal/GU negative Renal ROS  negative genitourinary   Musculoskeletal   Abdominal   Peds  Hematology  (+) Blood dyscrasia, anemia ,   Anesthesia Other Findings   Reproductive/Obstetrics negative OB ROS                            Anesthesia Physical Anesthesia Plan  ASA: 2  Anesthesia Plan: General   Post-op Pain Management:    Induction: Intravenous  PONV Risk Score and Plan: 4 or greater and Ondansetron, Dexamethasone and Midazolam  Airway Management Planned: Oral ETT  Additional Equipment:   Intra-op Plan:   Post-operative Plan: Extubation in OR  Informed Consent: I have reviewed the patients History and Physical, chart, labs and discussed the procedure including the risks, benefits and alternatives for the proposed anesthesia with the patient or authorized representative who has indicated his/her understanding and acceptance.     Dental advisory given  Plan Discussed with: CRNA  Anesthesia Plan Comments:         Anesthesia Quick Evaluation

## 2021-05-03 NOTE — Progress Notes (Signed)
No more vomiting and nausea is better. Pt is a little sleepy but easily arousable and states she is feeling better. Dr.W. Ola Spurr made aware and he is good with her going back to her room.

## 2021-05-03 NOTE — OR Nursing (Signed)
1200 Patient complained of nausea, Anesthesiologist consulted 6.25 phenergan ordered and given 1215

## 2021-05-04 ENCOUNTER — Encounter (HOSPITAL_COMMUNITY): Payer: Self-pay

## 2021-05-04 ENCOUNTER — Inpatient Hospital Stay (HOSPITAL_COMMUNITY): Payer: Medicaid Other | Admitting: Certified Registered Nurse Anesthetist

## 2021-05-04 ENCOUNTER — Encounter (HOSPITAL_COMMUNITY): Admission: EM | Disposition: A | Discharge status: Home / Self Care | Payer: Self-pay | Source: Home / Self Care

## 2021-05-04 HISTORY — PX: CHOLECYSTECTOMY: SHX55

## 2021-05-04 SURGERY — LAPAROSCOPIC CHOLECYSTECTOMY WITH INTRAOPERATIVE CHOLANGIOGRAM
Anesthesia: General | Site: Abdomen

## 2021-05-04 MED ORDER — DEXAMETHASONE SODIUM PHOSPHATE 10 MG/ML IJ SOLN
INTRAMUSCULAR | Status: DC | PRN
  Administered 2021-05-04: 10 mg via INTRAVENOUS

## 2021-05-04 MED ORDER — FENTANYL CITRATE (PF) 250 MCG/5ML IJ SOLN
INTRAMUSCULAR | Status: AC
  Filled 2021-05-04: qty 5

## 2021-05-04 MED ORDER — LACTATED RINGERS IR SOLN
Status: DC | PRN
  Administered 2021-05-04: 1000 mL

## 2021-05-04 MED ORDER — PROPOFOL 10 MG/ML IV BOLUS
INTRAVENOUS | Status: AC
  Filled 2021-05-04: qty 20

## 2021-05-04 MED ORDER — MIDAZOLAM HCL 2 MG/2ML IJ SOLN
INTRAMUSCULAR | Status: AC
  Filled 2021-05-04: qty 2

## 2021-05-04 MED ORDER — ACETAMINOPHEN 160 MG/5ML PO SOLN: 325.0000 mg | ORAL | Status: DC | PRN

## 2021-05-04 MED ORDER — SENNOSIDES-DOCUSATE SODIUM 8.6-50 MG PO TABS
1.0000 | ORAL_TABLET | Freq: Every day | ORAL | Status: DC
  Administered 2021-05-04: 1 via ORAL
  Filled 2021-05-04 (×2): qty 1

## 2021-05-04 MED ORDER — DEXAMETHASONE SODIUM PHOSPHATE 10 MG/ML IJ SOLN
INTRAMUSCULAR | Status: AC
  Filled 2021-05-04: qty 1

## 2021-05-04 MED ORDER — ACETAMINOPHEN 325 MG PO TABS: 325.0000 mg | ORAL_TABLET | ORAL | Status: DC | PRN

## 2021-05-04 MED ORDER — AMISULPRIDE (ANTIEMETIC) 5 MG/2ML IV SOLN: 10.0000 mg | Freq: Once | INTRAVENOUS | Status: DC | PRN

## 2021-05-04 MED ORDER — TRAMADOL HCL 50 MG PO TABS: 50.0000 mg | ORAL_TABLET | Freq: Four times a day (QID) | ORAL | Status: DC | PRN

## 2021-05-04 MED ORDER — OXYCODONE HCL 5 MG PO TABS
5.0000 mg | ORAL_TABLET | ORAL | Status: DC | PRN
  Administered 2021-05-04 – 2021-05-05 (×3): 10 mg via ORAL
  Filled 2021-05-04 (×3): qty 2

## 2021-05-04 MED ORDER — ONDANSETRON HCL 4 MG/2ML IJ SOLN
INTRAMUSCULAR | Status: DC | PRN
  Administered 2021-05-04: 4 mg via INTRAVENOUS

## 2021-05-04 MED ORDER — CIPROFLOXACIN IN D5W 400 MG/200ML IV SOLN
400.0000 mg | INTRAVENOUS | Status: AC
  Administered 2021-05-04: 400 mg via INTRAVENOUS
  Filled 2021-05-04: qty 200

## 2021-05-04 MED ORDER — SODIUM CHLORIDE 0.9 % IV SOLN
6.2500 mg | Freq: Once | INTRAVENOUS | Status: DC
  Filled 2021-05-04: qty 0.25

## 2021-05-04 MED ORDER — SCOPOLAMINE 1 MG/3DAYS TD PT72
1.0000 | MEDICATED_PATCH | TRANSDERMAL | Status: DC
  Filled 2021-05-04: qty 1

## 2021-05-04 MED ORDER — OXYCODONE HCL 5 MG/5ML PO SOLN
5.0000 mg | Freq: Once | ORAL | Status: DC | PRN
Start: 2021-05-04 — End: 2021-05-04

## 2021-05-04 MED ORDER — PROPOFOL 10 MG/ML IV BOLUS
INTRAVENOUS | Status: DC | PRN
  Administered 2021-05-04: 200 mg via INTRAVENOUS
  Administered 2021-05-04: 10 mg via INTRAVENOUS

## 2021-05-04 MED ORDER — 0.9 % SODIUM CHLORIDE (POUR BTL) OPTIME
TOPICAL | Status: DC | PRN
  Administered 2021-05-04: 1000 mL

## 2021-05-04 MED ORDER — LABETALOL HCL 5 MG/ML IV SOLN
INTRAVENOUS | Status: DC | PRN
  Administered 2021-05-04: 5 mg via INTRAVENOUS
  Administered 2021-05-04: 2.5 mg via INTRAVENOUS

## 2021-05-04 MED ORDER — SODIUM CHLORIDE 0.9 % IV SOLN: 6.2500 mg | Freq: Once | INTRAVENOUS | Status: DC

## 2021-05-04 MED ORDER — LIDOCAINE 2% (20 MG/ML) 5 ML SYRINGE
INTRAMUSCULAR | Status: DC | PRN
  Administered 2021-05-04: 100 mg via INTRAVENOUS

## 2021-05-04 MED ORDER — BUPIVACAINE-EPINEPHRINE (PF) 0.5% -1:200000 IJ SOLN
INTRAMUSCULAR | Status: AC
  Filled 2021-05-04: qty 30

## 2021-05-04 MED ORDER — PROMETHAZINE HCL 25 MG/ML IJ SOLN: 6.2500 mg | INTRAMUSCULAR | Status: DC | PRN

## 2021-05-04 MED ORDER — ONDANSETRON HCL 4 MG/2ML IJ SOLN
INTRAMUSCULAR | Status: AC
  Filled 2021-05-04: qty 2

## 2021-05-04 MED ORDER — BUPIVACAINE-EPINEPHRINE 0.5% -1:200000 IJ SOLN
INTRAMUSCULAR | Status: DC | PRN
  Administered 2021-05-04: 30 mL

## 2021-05-04 MED ORDER — SUGAMMADEX SODIUM 200 MG/2ML IV SOLN
INTRAVENOUS | Status: DC | PRN
  Administered 2021-05-04: 200 mg via INTRAVENOUS

## 2021-05-04 MED ORDER — OXYCODONE HCL 5 MG PO TABS: 5.0000 mg | ORAL_TABLET | Freq: Once | ORAL | Status: DC | PRN

## 2021-05-04 MED ORDER — LACTATED RINGERS IV SOLN: INTRAVENOUS | Status: DC | PRN

## 2021-05-04 MED ORDER — ROCURONIUM BROMIDE 10 MG/ML (PF) SYRINGE
PREFILLED_SYRINGE | INTRAVENOUS | Status: DC | PRN
  Administered 2021-05-04: 60 mg via INTRAVENOUS

## 2021-05-04 MED ORDER — MIDAZOLAM HCL 5 MG/5ML IJ SOLN
INTRAMUSCULAR | Status: DC | PRN
  Administered 2021-05-04: 2 mg via INTRAVENOUS

## 2021-05-04 MED ORDER — MORPHINE SULFATE (PF) 2 MG/ML IV SOLN: 2.0000 mg | INTRAVENOUS | Status: DC | PRN

## 2021-05-04 MED ORDER — FENTANYL CITRATE PF 50 MCG/ML IJ SOSY: 25.0000 ug | PREFILLED_SYRINGE | INTRAMUSCULAR | Status: DC | PRN

## 2021-05-04 MED ORDER — FENTANYL CITRATE (PF) 100 MCG/2ML IJ SOLN
INTRAMUSCULAR | Status: DC | PRN
  Administered 2021-05-04: 50 ug via INTRAVENOUS
  Administered 2021-05-04: 100 ug via INTRAVENOUS
  Administered 2021-05-04 (×3): 50 ug via INTRAVENOUS
  Administered 2021-05-04 (×2): 100 ug via INTRAVENOUS

## 2021-05-04 MED ORDER — ACETAMINOPHEN 10 MG/ML IV SOLN: 1000.0000 mg | Freq: Once | INTRAVENOUS | Status: DC | PRN

## 2021-05-04 MED ORDER — SCOPOLAMINE 1 MG/3DAYS TD PT72
MEDICATED_PATCH | TRANSDERMAL | Status: AC
  Filled 2021-05-04: qty 1

## 2021-05-04 SURGICAL SUPPLY — 38 items
APPLIER CLIP ROT 10 11.4 M/L (STAPLE) ×2
BAG COUNTER SPONGE SURGICOUNT (BAG) IMPLANT
CABLE HIGH FREQUENCY MONO STRZ (ELECTRODE) ×2 IMPLANT
CHLORAPREP W/TINT 26 (MISCELLANEOUS) ×2 IMPLANT
CLIP APPLIE ROT 10 11.4 M/L (STAPLE) ×1 IMPLANT
COVER MAYO STAND STRL (DRAPES) IMPLANT
COVER SURGICAL LIGHT HANDLE (MISCELLANEOUS) ×2 IMPLANT
CUTTER FLEX LINEAR 45M (STAPLE) ×2 IMPLANT
DECANTER SPIKE VIAL GLASS SM (MISCELLANEOUS) IMPLANT
DERMABOND ADVANCED (GAUZE/BANDAGES/DRESSINGS) ×1
DERMABOND ADVANCED .7 DNX12 (GAUZE/BANDAGES/DRESSINGS) ×1 IMPLANT
DRAPE C-ARM 42X120 X-RAY (DRAPES) IMPLANT
ELECT REM PT RETURN 15FT ADLT (MISCELLANEOUS) ×2 IMPLANT
GAUZE SPONGE 2X2 8PLY STRL LF (GAUZE/BANDAGES/DRESSINGS) ×1 IMPLANT
GLOVE SURG SYN 7.5  E (GLOVE) ×2
GLOVE SURG SYN 7.5 E (GLOVE) ×2 IMPLANT
GOWN STRL REUS W/TWL XL LVL3 (GOWN DISPOSABLE) IMPLANT
HEMOSTAT SURGICEL 4X8 (HEMOSTASIS) IMPLANT
KIT BASIN OR (CUSTOM PROCEDURE TRAY) ×2 IMPLANT
KIT TURNOVER KIT A (KITS) ×2 IMPLANT
PENCIL SMOKE EVACUATOR (MISCELLANEOUS) IMPLANT
POUCH SPECIMEN RETRIEVAL 10MM (ENDOMECHANICALS) ×2 IMPLANT
RELOAD STAPLE TA45 3.5 REG BLU (ENDOMECHANICALS) ×2 IMPLANT
SCISSORS LAP 5X35 DISP (ENDOMECHANICALS) ×2 IMPLANT
SET CHOLANGIOGRAPH MIX (MISCELLANEOUS) IMPLANT
SET IRRIG TUBING LAPAROSCOPIC (IRRIGATION / IRRIGATOR) ×2 IMPLANT
SET TUBE SMOKE EVAC HIGH FLOW (TUBING) IMPLANT
SLEEVE XCEL OPT CAN 5 100 (ENDOMECHANICALS) ×2 IMPLANT
SPONGE GAUZE 2X2 STER 10/PKG (GAUZE/BANDAGES/DRESSINGS) ×1
STRIP CLOSURE SKIN 1/2X4 (GAUZE/BANDAGES/DRESSINGS) IMPLANT
SUT MNCRL AB 4-0 PS2 18 (SUTURE) ×2 IMPLANT
TOWEL OR 17X26 10 PK STRL BLUE (TOWEL DISPOSABLE) ×2 IMPLANT
TOWEL OR NON WOVEN STRL DISP B (DISPOSABLE) ×2 IMPLANT
TRAY LAPAROSCOPIC (CUSTOM PROCEDURE TRAY) ×2 IMPLANT
TROCAR BLADELESS OPT 5 100 (ENDOMECHANICALS) ×2 IMPLANT
TROCAR XCEL 12X100 BLDLESS (ENDOMECHANICALS) ×2 IMPLANT
TROCAR XCEL BLUNT TIP 100MML (ENDOMECHANICALS) ×2 IMPLANT
TROCAR XCEL NON-BLD 11X100MML (ENDOMECHANICALS) ×2 IMPLANT

## 2021-05-04 NOTE — Transfer of Care (Signed)
Immediate Anesthesia Transfer of Care Note  Patient: Sandra Macdonald  Procedure(s) Performed: LAPAROSCOPIC CHOLECYSTECTOMY (Abdomen)  Patient Location: PACU  Anesthesia Type:General  Level of Consciousness: awake, alert , oriented and patient cooperative  Airway & Oxygen Therapy: Patient Spontanous Breathing and Patient connected to face mask oxygen  Post-op Assessment: Report given to RN, Post -op Vital signs reviewed and stable and Patient moving all extremities X 4  Post vital signs: stable  Last Vitals:  Vitals Value Taken Time  BP 132/69 05/04/21 1332  Temp 36.9 C 05/04/21 1332  Pulse 68 05/04/21 1344  Resp 10 05/04/21 1344  SpO2 100 % 05/04/21 1344  Vitals shown include unvalidated device data.  Last Pain:  Vitals:   05/04/21 1332  TempSrc:   PainSc: 0-No pain      Patients Stated Pain Goal: 5 (19/80/22 1798)  Complications: No notable events documented.

## 2021-05-04 NOTE — Progress Notes (Signed)
[facility] Progress Note  Sandra Macdonald 25 y.o. 05/03/1996  CC:  Cholelithiasis with choledocholithiasis   Subjective: Patient states her pain has improved from yesterday. Some nausea, but no vomiting. No recent BM but passing flatus.  ROS : Review of Systems  Cardiovascular:  Negative for chest pain and palpitations.  Gastrointestinal:  Positive for abdominal pain (improved) and nausea. Negative for blood in stool, constipation, diarrhea, heartburn, melena and vomiting.     Objective: Vital signs in last 24 hours: Vitals:   05/03/21 2147 05/04/21 0629  BP: (!) 99/54 (!) 120/57  Pulse: 72 73  Resp: 18 18  Temp: 98.4 F (36.9 C) 98.1 F (36.7 C)  SpO2: 99% 99%    Physical Exam:  General:  Alert, cooperative, no distress, Mom and Sister at bedside  Head:  Normocephalic, without obvious abnormality, atraumatic  Eyes:  Anicteric sclera, EOM's intact  Lungs:   Clear to auscultation bilaterally, respirations unlabored  Heart:  Regular rate and rhythm, S1, S2 normal  Abdomen:   Soft, non-tender, bowel sounds active all four quadrants,  no masses,   Extremities: Extremities normal, atraumatic, no  edema    Lab Results: Recent Labs    05/02/21 0401 05/03/21 0443  NA 141 138  K 4.0 3.4*  CL 111 107  CO2 27 26  GLUCOSE 96 104*  BUN 6 <5*  CREATININE 0.67 0.60  CALCIUM 8.7* 9.0   Recent Labs    05/02/21 0401 05/03/21 0443  AST 203* 236*  ALT 308* 332*  ALKPHOS 126 129*  BILITOT 2.8* 3.4*  PROT 6.8 7.4  ALBUMIN 3.1* 3.2*   Recent Labs    05/02/21 0401 05/03/21 0443  WBC 4.1 3.6*  HGB 8.3* 9.3*  HCT 28.5* 31.4*  MCV 80.3 81.3  PLT 419* 464*   No results for input(s): LABPROT, INR in the last 72 hours.    Assessment Cholelithiasis with choledocholithiasis - ERCP 9/27: The major papilla appeared normal.Two filling defects consistent with stones were seen on the cholangiogram. Choledocholithiasis was found. Complete removal was accomplished by  biliary sphincterotomy and basket extraction.One plastic stent was placed into the ventral pancreatic duct. A biliary sphincterotomy was performed. - 9/27: AST 236/ALT 332/ ALP 129 - T. Bili 3.4   Plan: Continue to trend LFTs, suspect there will be improvement s/p ERCP.  Follow up abd xray in 3-4 weeks to ensure pancreatic duct stent passage  Planning for cholecystectomy with surgery today  [facility] will follow  Jing Howatt Radford Pax PA-C 05/04/2021, 8:17 AM  Contact #  [phone]

## 2021-05-04 NOTE — Anesthesia Procedure Notes (Signed)
Procedure Name: Intubation Date/Time: 05/04/2021 12:04 PM Performed by: Lissa Morales, CRNA Pre-anesthesia Checklist: Patient identified, Emergency Drugs available, Suction available and Patient being monitored Patient Re-evaluated:Patient Re-evaluated prior to induction Oxygen Delivery Method: Circle system utilized Preoxygenation: Pre-oxygenation with 100% oxygen Induction Type: IV induction Ventilation: Mask ventilation without difficulty Laryngoscope Size: Mac and 4 Grade View: Grade II Tube type: Oral Tube size: 7.0 mm Number of attempts: 1 Airway Equipment and Method: Stylet and Oral airway Placement Confirmation: ETT inserted through vocal cords under direct vision, positive ETCO2 and breath sounds checked- equal and bilateral Secured at: 22 cm Tube secured with: Tape Dental Injury: Teeth and Oropharynx as per pre-operative assessment

## 2021-05-04 NOTE — Op Note (Signed)
Procedure Note  Pre-operative Diagnosis:  chronic cholecystitis, cholelithiasis, history of choledocholithiasis  Post-operative Diagnosis:  same  Surgeon:  Armandina Gemma, MD  Assistant:  Richard Miu, PA-C   Procedure:  Laparoscopic cholecystectomy  Anesthesia:  General  Estimated Blood Loss:  minimal  Drains: none         Specimen: gallbladder to pathology  Indications:  Patient is a 25 yo female with chronic cholecystitis and cholelithiasis.  Patient underwent successful ERCP with stone extraction yesterday.  Now for cholecystectomy.  Procedure Details:  The patient was seen in the pre-op holding area. The risks, benefits, complications, treatment options, and expected outcomes were previously discussed with the patient. The patient agreed with the proposed plan and has signed the informed consent form.  The patient was transported to operating room #1 at the [facility]. The patient was placed in the supine position on the operating room table. Following induction of general anesthesia, the abdomen was prepped and draped in the usual aseptic fashion.  An incision was made in the skin near the umbilicus. The midline fascia was incised and the peritoneal cavity was entered and a Hasson cannula was introduced under direct vision. The cannula was secured with a 0-Vicryl pursestring suture. Pneumoperitoneum was established with carbon dioxide. Additional cannulae were introduced under direct vision along the right costal margin in the midline, mid-clavicular line, and anterior axillary line.   The gallbladder was identified and the fundus grasped and retracted cephalad. Adhesions were taken down bluntly and the electrocautery was utilized as needed, taking care not to involve any adjacent structures. The infundibulum was grasped and retracted laterally, exposing the peritoneum overlying the triangle of Calot. The peritoneum was incised and structures exposed with blunt dissection.  The cystic artery was identified, dissected circumferentially, ligated with ligaclips, and divided. The cystic duct was clearly identified and bluntly dissected circumferentially.  The duct was markedly dilated and thick-walled.  A regular ligaclip wound not completely occlude the duct.  Therefore an Endo-GIA stapler with a "blue" load was selected, placed across the proximal cystic duct, and fired, dividing the cystic duct with a good seal noted.  The gallbladder was dissected away from the gallbladder bed using the electrocautery for hemostasis. The gallbladder was completely removed from the liver and placed into an endocatch bag. The gallbladder was removed in the endocatch bag through the umbilical port site and submitted to pathology for review.  The right upper quadrant was irrigated and the gallbladder bed was inspected. Hemostasis was achieved with the electrocautery.  Cannulae were removed under direct vision and good hemostasis was noted. Pneumoperitoneum was released and the majority of the carbon dioxide evacuated. The umbilical wound was irrigated and the fascia was then closed with the pursestring suture.  Local anesthetic was infiltrated at all port sites. Skin incisions were closed with 4-0 Monocril subcuticular sutures and Dermabond was applied.  Instrument, sponge, and needle counts were correct at the conclusion of the case.  The patient was awakened from anesthesia and brought to the recovery room in stable condition.  The patient tolerated the procedure well.   Armandina Gemma, MD [facility] Office: [phone]

## 2021-05-04 NOTE — Anesthesia Postprocedure Evaluation (Signed)
Anesthesia Post Note  Patient: Sandra Macdonald  Procedure(s) Performed: LAPAROSCOPIC CHOLECYSTECTOMY (Abdomen)     Patient location during evaluation: PACU Anesthesia Type: General Level of consciousness: awake and alert and oriented Pain management: pain level controlled Vital Signs Assessment: post-procedure vital signs reviewed and stable Respiratory status: spontaneous breathing, nonlabored ventilation and respiratory function stable Cardiovascular status: blood pressure returned to baseline Postop Assessment: no apparent nausea or vomiting Anesthetic complications: no   No notable events documented.  Last Vitals:  Vitals:   05/04/21 1400 05/04/21 1415  BP: (!) 141/83 (!) 142/76  Pulse: 72 69  Resp: 14 13  Temp:    SpO2: 99% 99%    Last Pain:  Vitals:   05/04/21 1415  TempSrc:   PainSc: 0-No pain                 Marthenia Rolling

## 2021-05-04 NOTE — Anesthesia Preprocedure Evaluation (Addendum)
Anesthesia Evaluation  Patient identified by MRN, date of birth, ID band Patient awake    Reviewed: Allergy & Precautions, NPO status , Patient's Chart, lab work & pertinent test results  History of Anesthesia Complications Negative for: history of anesthetic complications  Airway Mallampati: II  TM Distance: >3 FB Neck ROM: Full    Dental no notable dental hx.    Pulmonary neg pulmonary ROS,    Pulmonary exam normal        Cardiovascular negative cardio ROS Normal cardiovascular exam     Neuro/Psych PSYCHIATRIC DISORDERS Depression negative neurological ROS     GI/Hepatic Neg liver ROS, gallstones   Endo/Other  negative endocrine ROS  Renal/GU negative Renal ROS  negative genitourinary   Musculoskeletal negative musculoskeletal ROS (+)   Abdominal   Peds  Hematology  (+) anemia , Hgb 9.3   Anesthesia Other Findings G6PD deficiency  Reproductive/Obstetrics negative OB ROS                            Anesthesia Physical Anesthesia Plan  ASA: 2  Anesthesia Plan: General   Post-op Pain Management:    Induction: Intravenous  PONV Risk Score and Plan: 4 or greater and Ondansetron, Dexamethasone, Midazolam and Scopolamine patch - Pre-op  Airway Management Planned: Oral ETT  Additional Equipment: None  Intra-op Plan:   Post-operative Plan: Extubation in OR  Informed Consent: I have reviewed the patients History and Physical, chart, labs and discussed the procedure including the risks, benefits and alternatives for the proposed anesthesia with the patient or authorized representative who has indicated his/her understanding and acceptance.     Dental advisory given  Plan Discussed with: CRNA  Anesthesia Plan Comments:        Anesthesia Quick Evaluation

## 2021-05-04 NOTE — TOC Initial Note (Signed)
Transition of Care [facility]) - Initial/Assessment Note    Patient Details  Name: Sandra Macdonald MRN: [id number] Date of Birth: Jan 19, 1996  Transition of Care [facility]) CM/SW Contact:    Leeroy Cha, RN Phone Number: 05/04/2021, 7:57 AM  Clinical Narrative:                  25 y.o. female with couple-month history of intermittent upper abdominal pain.  Worse recently, necessitating admission.  MRCP shows choledocholithiasis and cholelithiasis.  She has some nausea/vomiting; no fevers/chills or blood in stool.  TOC PLAN OF CARE: Possible or today [id number] for cholecystectomy.  Expected Discharge Plan: Home/Self Care Barriers to Discharge: Continued Medical Work up   Patient Goals and CMS Choice Patient states their goals for this hospitalization and ongoing recovery are:: i want to go home CMS Medicare.gov Compare Post Acute Care list provided to:: Patient    Expected Discharge Plan and Services Expected Discharge Plan: Home/Self Care   Discharge Planning Services: CM Consult   Living arrangements for the past 2 months: Apartment                                      Prior Living Arrangements/Services Living arrangements for the past 2 months: Apartment Lives with:: Self Patient language and need for interpreter reviewed:: Yes Do you feel safe going back to the place where you live?: Yes            Criminal Activity/Legal Involvement Pertinent to Current Situation/Hospitalization: No - Comment as needed  Activities of Daily Living Home Assistive Devices/Equipment: None ADL Screening (condition at time of admission) Patient's cognitive ability adequate to safely complete daily activities?: Yes Is the patient deaf or have difficulty hearing?: No Does the patient have difficulty seeing, even when wearing glasses/contacts?: No Does the patient have difficulty concentrating, remembering, or making decisions?: No Patient able to express need for assistance with ADLs?:  Yes Does the patient have difficulty dressing or bathing?: No Independently performs ADLs?: Yes (appropriate for developmental age) Does the patient have difficulty walking or climbing stairs?: No Weakness of Legs: None Weakness of Arms/Hands: None  Permission Sought/Granted                  Emotional Assessment Appearance:: Appears stated age     Orientation: : Oriented to Self, Oriented to Place, Oriented to  Time, Oriented to Situation Alcohol / Substance Use: Not Applicable Psych Involvement: No (comment)  Admission diagnosis:  Gallstones [K80.20] RUQ pain [R10.11] Patient Active Problem List   Diagnosis Date Noted   Gallstones 05/01/2021   History of command hallucinations    Iron deficiency anemia 12/28/2020   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   Psychosis ([place]) 12/03/2019   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   Incompetent cervix in pregnancy, antepartum, 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

## 2021-05-04 NOTE — Progress Notes (Signed)
Subjective: Pain stable to improved after ERCP yesterday. No nausea/emesis. Passing flatus. No recent BM  Mom and sister bedside  Objective: Vital signs in last 24 hours: Temp:  [98.1 F (36.7 C)-98.8 F (37.1 C)] 98.1 F (36.7 C) (09/28 0629) Pulse Rate:  [58-89] 73 (09/28 0629) Resp:  [10-21] 18 (09/28 0629) BP: (99-152)/(48-97) 120/57 (09/28 0629) SpO2:  [96 %-100 %] 99 % (09/28 0629) Weight:  [709 kg] 107 kg (09/27 0937)    Intake/Output from previous day: 09/27 0701 - 09/28 0700 In: 3925.1 [P.O.:930; I.V.:2795.1; IV Piggyback:200] Out: 1905 [Urine:1900; Blood:5] Intake/Output this shift: No intake/output data recorded.  PE: Gen:  Alert, NAD, pleasant HEENT: EOM's intact, pupils equal and round Card:  RRR Pulm:  CTAB, no W/R/R, effort normal Abd: Soft, ND, mild epigastric and ruq tenderness to palpation, +BS Ext:  No LE edema or calf tenderness Psych: A&Ox3  Skin: no rashes noted, warm and dry  Lab Results:  Recent Labs    05/02/21 0401 05/03/21 0443  WBC 4.1 3.6*  HGB 8.3* 9.3*  HCT 28.5* 31.4*  PLT 419* 464*    BMET Recent Labs    05/02/21 0401 05/03/21 0443  NA 141 138  K 4.0 3.4*  CL 111 107  CO2 27 26  GLUCOSE 96 104*  BUN 6 <5*  CREATININE 0.67 0.60  CALCIUM 8.7* 9.0    PT/INR No results for input(s): LABPROT, INR in the last 72 hours. CMP     Component Value Date/Time   NA 138 05/03/2021 0443   K 3.4 (L) 05/03/2021 0443   CL 107 05/03/2021 0443   CO2 26 05/03/2021 0443   GLUCOSE 104 (H) 05/03/2021 0443   BUN <5 (L) 05/03/2021 0443   CREATININE 0.60 05/03/2021 0443   CALCIUM 9.0 05/03/2021 0443   PROT 7.4 05/03/2021 0443   ALBUMIN 3.2 (L) 05/03/2021 0443   AST 236 (H) 05/03/2021 0443   ALT 332 (H) 05/03/2021 0443   ALKPHOS 129 (H) 05/03/2021 0443   BILITOT 3.4 (H) 05/03/2021 0443   GFRNONAA >60 05/03/2021 0443   GFRAA >60 12/02/2019 0441   Lipase     Component Value Date/Time   LIPASE 28 05/01/2021 0932     Studies/Results: DG ERCP  Result Date: 05/03/2021 CLINICAL DATA:  25 year old female with a history of cholelithiasis EXAM: ERCP TECHNIQUE: Multiple spot images obtained with the fluoroscopic device and submitted for interpretation post-procedure. FLUOROSCOPY TIME:  Fluoroscopy Time:  3 minutes 36 seconds COMPARISON:  None. FINDINGS: Limited intraoperative fluoroscopic spot images during ERCP. Initial image demonstrates the endoscope projecting over the upper abdomen with partial opacification of the extrahepatic biliary ducts. There is deployment of a retrieval balloon catheter. IMPRESSION: Limited images during ERCP demonstrates deployment of a balloon retrieval catheter. Please refer to the dictated operative report for full details of intraoperative findings and procedure. Electronically Signed   By: Corrie Mckusick D.O.   On: 05/03/2021 12:02    Anti-infectives: Anti-infectives (From admission, onward)    Start     Dose/Rate Route Frequency Ordered Stop   05/04/21 0815  ciprofloxacin (CIPRO) IVPB 400 mg        400 mg 200 mL/hr over 60 Minutes Intravenous On call to O.R. 05/04/21 0720 05/05/21 0559   05/04/21 0600  ciprofloxacin (CIPRO) IVPB 400 mg  Status:  Discontinued        400 mg 200 mL/hr over 60 Minutes Intravenous On call to O.R. 05/03/21 0821 05/03/21 1325   05/03/21 0600  ciprofloxacin (CIPRO) IVPB 400 mg        400 mg 200 mL/hr over 60 Minutes Intravenous On call 05/02/21 1712 05/03/21 1038        Assessment/Plan Cholelithiasis with choledocholithiasis - s/p ERCP Dr. Paulita Fujita - biliary sphincterotomy with stone extraction and stent placement - No evidence of acute cholecystitis on imaging.  WBC within normal limits. Preop abx yesterday and today - lap chole today I have explained the procedure, risks, and aftercare of cholecystectomy.  Risks include but are not limited to bleeding, infection, wound problems, anesthesia, diarrhea, bile leak, injury to common bile  duct/liver/intestine.  She seems to understand and agrees to proceed.  FEN - NPO, IVF VTE - SCDs, Lovenox - hold this am ID - cipro preop  COVID + - positive home test 1 mo ago. Asymptomatic. Off precautions 9/26.   LOS: 3 days    [facility] 05/04/2021, 7:54 AM Please see Amion for pager number during day hours 7:00am-4:30pm

## 2021-05-05 ENCOUNTER — Encounter (HOSPITAL_COMMUNITY): Payer: Self-pay | Admitting: Surgery

## 2021-05-05 DIAGNOSIS — Z9889 Other specified postprocedural states: Secondary | ICD-10-CM

## 2021-05-05 DIAGNOSIS — Z9049 Acquired absence of other specified parts of digestive tract: Secondary | ICD-10-CM

## 2021-05-05 DIAGNOSIS — R1011 Right upper quadrant pain: Secondary | ICD-10-CM

## 2021-05-05 DIAGNOSIS — Z8719 Personal history of other diseases of the digestive system: Secondary | ICD-10-CM

## 2021-05-05 LAB — SURGICAL PATHOLOGY

## 2021-05-05 MED ORDER — ACETAMINOPHEN 500 MG PO TABS: 1000.0000 mg | ORAL_TABLET | Freq: Four times a day (QID) | ORAL | Status: AC | PRN

## 2021-05-05 MED ORDER — SENNOSIDES-DOCUSATE SODIUM 8.6-50 MG PO TABS: 1.0000 | ORAL_TABLET | Freq: Every evening | ORAL | Status: AC | PRN

## 2021-05-05 NOTE — Progress Notes (Signed)
D/C instructions given to patient, patient has no questions. NT or writer will wheel patient out once her ride is here

## 2021-05-05 NOTE — Discharge Summary (Signed)
[facility] Discharge Summary   Patient ID: Sandra Macdonald MRN: [id number] DOB/AGE: 1995/11/30 25 y.o.  Admit date: 05/01/2021 Discharge date: 05/05/2021  Admitting Diagnosis: Gallstones [K80.20] RUQ pain [R10.11]   Discharge Diagnosis Patient Active Problem List   Diagnosis Date Noted   History of laparoscopic cholecystectomy 05/05/2021   History of ERCP 05/05/2021   History of cholelithiasis 05/05/2021     Consultants Gastroenterology - Dr. Paulita Fujita  Imaging: DG ERCP  Result Date: 05/03/2021 CLINICAL DATA:  25 year old female with a history of cholelithiasis EXAM: ERCP TECHNIQUE: Multiple spot images obtained with the fluoroscopic device and submitted for interpretation post-procedure. FLUOROSCOPY TIME:  Fluoroscopy Time:  3 minutes 36 seconds COMPARISON:  None. FINDINGS: Limited intraoperative fluoroscopic spot images during ERCP. Initial image demonstrates the endoscope projecting over the upper abdomen with partial opacification of the extrahepatic biliary ducts. There is deployment of a retrieval balloon catheter. IMPRESSION: Limited images during ERCP demonstrates deployment of a balloon retrieval catheter. Please refer to the dictated operative report for full details of intraoperative findings and procedure. Electronically Signed   By: Corrie Mckusick D.O.   On: 05/03/2021 12:02    Procedures Dr. Harlow Asa (05/04/21) - Laparoscopic Cholecystectomy Dr. Paulita Fujita (05/03/21) - ERCP  Hospital Course:  The patient is a 25 year old female who presented with right upper quadrant abdominal pain to [facility] ED. She was here the week prior with the same complaint but refused admission. Pain was associated with significant nausea and vomiting. Ultrasound showed gallstones. LFTs were significantly elevated since week prior. Patient was admitted and underwent procedures listed above - ERCP and then laparoscopic cholecystectomy.  Tolerated procedure well and was transferred to the floor.  Diet was  advanced as tolerated.  On POD1 from laparoscopic cholecystectomy, the patient was voiding well, tolerating diet, ambulating well, pain well controlled, vital signs stable, incisions c/d/i and felt stable for discharge home.  She will call to follow up with gastroenterology. Patient will follow up in our office in 3 weeks and knows to call with questions or concerns. She will call to confirm appointment date/time.    She has oxycodone at home from prior ED visit and was not discharged with new opioid prescription  Physical Exam: General:  Alert, NAD, pleasant, comfortable Abd:  Soft, ND, mild tenderness, incisions C/D/I Ext:  No LE edema or calf tenderness Psych: A&Ox3  Skin: no rashes noted, warm and dry  Allergies as of 05/05/2021       Reactions   Bactrim [sulfamethoxazole-trimethoprim]    Trouble breathing, swelling   Blue Dyes (parenteral) Other (See Comments)   G6PD-- Decreased hemoglobin   Codeine Other (See Comments)   Decreased hemoglobin   Ibuprofen    Trouble breathing, swelling   Other Other (See Comments)   G6PD--Decreased hemoglobin to potato chips (not potatoes in other forms)   Penicillins    Trouble breathing, swelling        Medication List     TAKE these medications    acetaminophen 500 MG tablet Commonly known as: TYLENOL Take 2 tablets (1,000 mg total) by mouth every 6 (six) hours as needed for up to 5 days for fever or mild pain.   FLUoxetine 10 MG capsule Commonly known as: PROZAC Take 1 capsule (10 mg total) by mouth daily.   iron polysaccharides 150 MG capsule Commonly known as: NIFEREX Take 1 capsule (150 mg total) by mouth daily.   naproxen sodium 220 MG tablet Commonly known as: ALEVE Take 440 mg by mouth  daily as needed (pain).   OLANZapine 2.5 MG tablet Commonly known as: ZYPREXA Take 1 tablet (2.5 mg total) by mouth at bedtime.   omeprazole 20 MG capsule Commonly known as: PRILOSEC Take 1 capsule (20 mg total) by mouth daily for 7  days.   ondansetron 4 MG disintegrating tablet Commonly known as: Zofran ODT Take 1 tablet (4 mg total) by mouth every 8 (eight) hours as needed for nausea.   oxyCODONE 5 MG immediate release tablet Commonly known as: Roxicodone Take 1 tablet (5 mg total) by mouth every 4 (four) hours as needed for severe pain.   polyethylene glycol 17 g packet Commonly known as: MIRALAX / GLYCOLAX Take 17 g by mouth daily as needed for mild constipation.   senna-docusate 8.6-50 MG tablet Commonly known as: Senokot-S Take 1 tablet by mouth at bedtime as needed for up to 5 days for mild constipation or moderate constipation.   triamcinolone cream 0.1 % Commonly known as: KENALOG Apply 1 application topically 2 (two) times daily.          Follow-up Information     [facility], [place]. Call.   Specialty: General Surgery Why: We are working to make this appointment for you. Please call to confirm appointment date and time Contact information: [address] [place] [address] [phone]         Arta Silence, MD. Call.   Specialty: Gastroenterology Why: call to schedule your follow up appointment for your ERCP Contact information: [address] [place] [address] [phone]                 Signed: Caroll Rancher [facility] 05/05/2021, 11:33 AM Please see Amion for pager number during day hours 7:00am-4:30pm

## 2021-05-05 NOTE — Discharge Instructions (Signed)
[facility] ______[facility] °LAPAROSCOPIC SURGERY: POST OP INSTRUCTIONS °Always review your discharge instruction sheet given to you by the facility where your surgery was performed. °IF YOU HAVE DISABILITY OR FAMILY LEAVE FORMS, YOU MUST BRING THEM TO THE OFFICE FOR PROCESSING.   °DO NOT GIVE THEM TO YOUR DOCTOR. ° °A prescription for pain medication may be given to you upon discharge.  Take your pain medication as prescribed, if needed.  If narcotic pain medicine is not needed, then you may take acetaminophen (Tylenol) or ibuprofen (Advil) as needed. °Take your usually prescribed medications unless otherwise directed. °If you need a refill on your pain medication, please contact your pharmacy.  They will contact our office to request authorization. Prescriptions will not be filled after 5pm or on week-ends. °You should follow a light diet the first few days after arrival home, such as soup and crackers, etc.  Be sure to include lots of fluids daily. °Most patients will experience some swelling and bruising in the area of the incisions.  Ice packs will help.  Swelling and bruising can take several days to resolve.  °It is common to experience some constipation if taking pain medication after surgery.  Increasing fluid intake and taking a stool softener (such as Colace) will usually help or prevent this problem from occurring.  A mild laxative (Milk of Magnesia or Miralax) should be taken according to package instructions if there are no bowel movements after 48 hours. °Unless discharge instructions indicate otherwise, you may remove your bandages 24-48 hours after surgery, and you may shower at that time.  You may have steri-strips (small skin tapes) in place directly over the incision.  These strips should be left on the skin for 7-10 days.  If your surgeon used skin glue on the incision, you may shower in 24 hours.  The glue will flake off over the next 2-3 weeks.  Any sutures or staples will be  removed at the office during your follow-up visit. °ACTIVITIES:  You may resume regular (light) daily activities beginning the next day--such as daily self-care, walking, climbing stairs--gradually increasing activities as tolerated.  You may have sexual intercourse when it is comfortable.  Refrain from any heavy lifting or straining until approved by your doctor. °You may drive when you are no longer taking prescription pain medication, you can comfortably wear a seatbelt, and you can safely maneuver your car and apply brakes. °RETURN TO WORK:  __________________________________________________________ °You should see your doctor in the office for a follow-up appointment approximately 2-3 weeks after your surgery.  Make sure that you call for this appointment within a day or two after you arrive home to insure a convenient appointment time. °OTHER INSTRUCTIONS: __________________________________________________________________________________________________________________________ __________________________________________________________________________________________________________________________ °WHEN TO CALL YOUR DOCTOR: °Fever over 101.0 °Inability to urinate °Continued bleeding from incision. °Increased pain, redness, or drainage from the incision. °Increasing abdominal pain ° °The clinic staff is available to answer your questions during regular business hours.  Please don’t hesitate to call and ask to speak to one of the nurses for clinical concerns.  If you have a medical emergency, go to the nearest emergency room or call 911.  A surgeon from [facility] is always on call at the hospital. °[address], [address], [place], [place]  [address] ? [address] [address], , [place]   [address] °[phone] ? [phone] ? FAX [phone] °Web site: [email or web address] ° ° ° °Managing Your Pain After Surgery Without Opioids ° ° ° °Thank you for   participating in our program to  help patients manage their pain after surgery without opioids. This is part of our effort to provide you with the best care possible, without exposing you or your family to the risk that opioids pose. ° °What pain can I expect after surgery? °You can expect to have some pain after surgery. This is normal. The pain is typically worse the day after surgery, and quickly begins to get better. °Many studies have found that many patients are able to manage their pain after surgery with Over-the-Counter (OTC) medications such as Tylenol and Motrin. If you have a condition that does not allow you to take Tylenol or Motrin, notify your surgical team. ° °How will I manage my pain? °The best strategy for controlling your pain after surgery is around the clock pain control with Tylenol (acetaminophen) and Motrin (ibuprofen or Advil). Alternating these medications with each other allows you to maximize your pain control. In addition to Tylenol and Motrin, you can use heating pads or ice packs on your incisions to help reduce your pain. ° °How will I alternate your regular strength over-the-counter pain medication? °You will take a dose of pain medication every three hours. °Start by taking 650 mg of Tylenol (2 pills of 325 mg) °3 hours later take 600 mg of Motrin (3 pills of 200 mg) °3 hours after taking the Motrin take 650 mg of Tylenol °3 hours after that take 600 mg of Motrin. ° ° °- 1 - ° °See example - if your first dose of Tylenol is at 12:00 PM ° ° °12:00 PM Tylenol 650 mg (2 pills of 325 mg)  °3:00 PM Motrin 600 mg (3 pills of 200 mg)  °6:00 PM Tylenol 650 mg (2 pills of 325 mg)  °9:00 PM Motrin 600 mg (3 pills of 200 mg)  °Continue alternating every 3 hours  ° °We recommend that you follow this schedule around-the-clock for at least 3 days after surgery, or until you feel that it is no longer needed. Use the table on the last page of this handout to keep track of the medications you are taking. °Important: °Do not take  more than 3000mg of Tylenol or 3200mg of Motrin in a 24-hour period. °Do not take ibuprofen/Motrin if you have a history of bleeding stomach ulcers, severe kidney disease, &/or actively taking a blood thinner ° °What if I still have pain? °If you have pain that is not controlled with the over-the-counter pain medications (Tylenol and Motrin or Advil) you might have what we call “breakthrough” pain. You will receive a prescription for a small amount of an opioid pain medication such as Oxycodone, Tramadol, or Tylenol with Codeine. Use these opioid pills in the first 24 hours after surgery if you have breakthrough pain. Do not take more than 1 pill every 4-6 hours. ° °If you still have uncontrolled pain after using all opioid pills, don't hesitate to call our staff using the number provided. We will help make sure you are managing your pain in the best way possible, and if necessary, we can provide a prescription for additional pain medication. ° ° °Day 1   ° °Time  °Name of Medication Number of pills taken  °Amount of Acetaminophen  °Pain Level  ° °Comments  °AM PM       °AM PM       °AM PM       °AM PM       °AM PM       °  AM PM       °AM PM       °AM PM       °Total Daily amount of Acetaminophen °Do not take more than  3,000 mg per day    ° ° °Day 2   ° °Time  °Name of Medication Number of pills °taken  °Amount of Acetaminophen  °Pain Level  ° °Comments  °AM PM       °AM PM       °AM PM       °AM PM       °AM PM       °AM PM       °AM PM       °AM PM       °Total Daily amount of Acetaminophen °Do not take more than  3,000 mg per day    ° ° °Day 3   ° °Time  °Name of Medication Number of pills taken  °Amount of Acetaminophen  °Pain Level  ° °Comments  °AM PM       °AM PM       °AM PM       °AM PM       ° ° ° °AM PM       °AM PM       °AM PM       °AM PM       °Total Daily amount of Acetaminophen °Do not take more than  3,000 mg per day    ° ° °Day 4   ° °Time  °Name of Medication Number of pills taken  °Amount of  Acetaminophen  °Pain Level  ° °Comments  °AM PM       °AM PM       °AM PM       °AM PM       °AM PM       °AM PM       °AM PM       °AM PM       °Total Daily amount of Acetaminophen °Do not take more than  3,000 mg per day    ° ° °Day 5   ° °Time  °Name of Medication Number °of pills taken  °Amount of Acetaminophen  °Pain Level  ° °Comments  °AM PM       °AM PM       °AM PM       °AM PM       °AM PM       °AM PM       °AM PM       °AM PM       °Total Daily amount of Acetaminophen °Do not take more than  3,000 mg per day    ° ° ° °Day 6   ° °Time  °Name of Medication Number of pills °taken  °Amount of Acetaminophen  °Pain Level  °Comments  °AM PM       °AM PM       °AM PM       °AM PM       °AM PM       °AM PM       °AM PM       °AM PM       °Total Daily amount of Acetaminophen °Do not take more than  3,000 mg per day    ° ° °Day 7   ° °Time  °  Name of Medication Number of pills taken  °Amount of Acetaminophen  °Pain Level  ° °Comments  °AM PM       °AM PM       °AM PM       °AM PM       °AM PM       °AM PM       °AM PM       °AM PM       °Total Daily amount of Acetaminophen °Do not take more than  3,000 mg per day    ° ° ° ° °For additional information about how and where to safely dispose of unused opioid °medications - https://www.morepowerful[employer].org ° °Disclaimer: This document contains information and/or instructional materials adapted from [facility] for the typical patient with your condition. It does not replace medical advice from your health care provider because your experience may differ from that of the °typical patient. Talk to your health care provider if you have any questions about this °document, your condition or your treatment plan. °Adapted from [facility] ° °

## 2021-08-14 ENCOUNTER — Encounter (HOSPITAL_COMMUNITY): Payer: Self-pay

## 2021-08-14 ENCOUNTER — Emergency Department (HOSPITAL_COMMUNITY)
Admission: EM | Admit: 2021-08-14 | Discharge: 2021-08-15 | Disposition: A | Discharge status: Home / Self Care | Payer: Medicaid Other | Attending: Emergency Medicine | Admitting: Emergency Medicine

## 2021-08-14 ENCOUNTER — Other Ambulatory Visit: Payer: Self-pay

## 2021-08-14 DIAGNOSIS — Z20822 Contact with and (suspected) exposure to covid-19: Secondary | ICD-10-CM | POA: Insufficient documentation

## 2021-08-14 DIAGNOSIS — R5383 Other fatigue: Secondary | ICD-10-CM | POA: Diagnosis present

## 2021-08-14 DIAGNOSIS — N9489 Other specified conditions associated with female genital organs and menstrual cycle: Secondary | ICD-10-CM | POA: Insufficient documentation

## 2021-08-14 DIAGNOSIS — D649 Anemia, unspecified: Secondary | ICD-10-CM

## 2021-08-14 LAB — COMPREHENSIVE METABOLIC PANEL
ALT: 14 U/L (ref 0–44)
AST: 16 U/L (ref 15–41)
Albumin: 3.8 g/dL (ref 3.5–5.0)
Alkaline Phosphatase: 53 U/L (ref 38–126)
Anion gap: 6 (ref 5–15)
BUN: 14 mg/dL (ref 6–20)
CO2: 26 mmol/L (ref 22–32)
Calcium: 9.1 mg/dL (ref 8.9–10.3)
Chloride: 107 mmol/L (ref 98–111)
Creatinine, Ser: 0.66 mg/dL (ref 0.44–1.00)
GFR, Estimated: >60 mL/min (ref >60)
Glucose, Bld: 93 mg/dL (ref 70–99)
Potassium: 3.3 mmol/L — ABNORMAL LOW (ref 3.5–5.1)
Sodium: 139 mmol/L (ref 135–145)
Total Bilirubin: 0.5 mg/dL (ref 0.3–1.2)
Total Protein: 7.9 g/dL (ref 6.5–8.1)

## 2021-08-14 LAB — CBC WITH DIFFERENTIAL/PLATELET
Abs Immature Granulocytes: 0.03 10*3/uL (ref 0.00–0.07)
Basophils Absolute: 0 10*3/uL (ref 0.0–0.1)
Basophils Relative: 1 %
Eosinophils Absolute: 0.1 10*3/uL (ref 0.0–0.5)
Eosinophils Relative: 2 %
HCT: 27.3 % — ABNORMAL LOW (ref 36.0–46.0)
Hemoglobin: 7.5 g/dL — ABNORMAL LOW (ref 12.0–15.0)
Immature Granulocytes: 1 %
Lymphocytes Relative: 31 %
Lymphs Abs: 1.8 10*3/uL (ref 0.7–4.0)
MCH: 19 pg — ABNORMAL LOW (ref 26.0–34.0)
MCHC: 27.5 g/dL — ABNORMAL LOW (ref 30.0–36.0)
MCV: 69.1 fL — ABNORMAL LOW (ref 80.0–100.0)
Monocytes Absolute: 0.5 10*3/uL (ref 0.1–1.0)
Monocytes Relative: 9 %
Neutro Abs: 3.3 10*3/uL (ref 1.7–7.7)
Neutrophils Relative %: 56 %
Platelets: 487 10*3/uL — ABNORMAL HIGH (ref 150–400)
RBC: 3.95 MIL/uL (ref 3.87–5.11)
RDW: 20.4 % — ABNORMAL HIGH (ref 11.5–15.5)
WBC: 5.8 10*3/uL (ref 4.0–10.5)
nRBC: 0 % (ref 0.0–0.2)

## 2021-08-14 LAB — I-STAT BETA HCG BLOOD, ED (MC, WL, AP ONLY): I-stat hCG, quantitative: <5 m[IU]/mL (ref <5)

## 2021-08-14 NOTE — ED Triage Notes (Signed)
Pt reports with fatigue and headache, pt states that she feels like she needs a blood transfusion. Pt states that she has needed one in the past and this is what it feels like.

## 2021-08-14 NOTE — ED Provider Notes (Signed)
[facility] DEPT Provider Note   CSN: [id number] Arrival date & time: 08/14/21  2210     History  Chief Complaint  Patient presents with   Fatigue    Sandra Macdonald is a 26 y.o. female.    26 year old female with a medical history significant for menorrhagia, uterine fibroids, iron deficiency anemia, G6PD deficiency anemia, depression with psychotic features, prior history of blood transfusions with a baseline hemoglobin around 9 who presents to the emergency department with lightheadedness and fatigue.  The patient states that she has been fatigued and felt lightheaded for the past several days.  She has a history of blood transfusions for symptomatic anemia in the past.  She has iron deficiency anemia and G6PD anemia that is complicated by episodes of menorrhagia with associated uterine fibroids.  She denies any vaginal bleeding at this time.  She currently does not have a PCP or an OB/GYN.  Home Medications Prior to Admission medications   Not on File      Allergies    Bactrim [sulfamethoxazole-trimethoprim], Blue dyes (parenteral), Codeine, Ibuprofen, Other, and Penicillins    Review of Systems   Review of Systems  Neurological:  Positive for headaches.   Physical Exam Updated Vital Signs BP (!) 155/83    Pulse 72    Temp 98.3 F (36.8 C) (Oral)    Resp 18    Ht 5\' 5"  (1.651 m)    Wt 81.6 kg    SpO2 100%    BMI 29.95 kg/m  Physical Exam Vitals and nursing note reviewed. Exam conducted with a chaperone present.  Constitutional:      General: She is not in acute distress.    Appearance: She is well-developed.  HENT:     Head: Normocephalic and atraumatic.  Eyes:     Conjunctiva/sclera: Conjunctivae normal.     Pupils: Pupils are equal, round, and reactive to light.  Cardiovascular:     Rate and Rhythm: Normal rate and regular rhythm.     Heart sounds: No murmur heard. Pulmonary:     Effort: Pulmonary effort is normal. No respiratory  distress.     Breath sounds: Normal breath sounds.  Abdominal:     General: There is no distension.     Palpations: Abdomen is soft.     Tenderness: There is no abdominal tenderness. There is no guarding.  Genitourinary:    Rectum: Guaiac result negative. No external hemorrhoid or internal hemorrhoid.  Musculoskeletal:        General: No swelling, deformity or signs of injury.     Cervical back: Neck supple.  Skin:    General: Skin is warm and dry.     Capillary Refill: Capillary refill takes less than 2 seconds.     Findings: No lesion or rash.  Neurological:     General: No focal deficit present.     Mental Status: She is alert. Mental status is at baseline.  Psychiatric:        Mood and Affect: Mood normal.    ED Results / Procedures / Treatments   Labs (all labs ordered are listed, but only abnormal results are displayed) Labs Reviewed  CBC WITH DIFFERENTIAL/PLATELET - Abnormal; Notable for the following components:      Result Value   Hemoglobin 7.5 (*)    HCT 27.3 (*)    MCV 69.1 (*)    MCH 19.0 (*)    MCHC 27.5 (*)    RDW 20.4 (*)  Platelets 487 (*)    All other components within normal limits  COMPREHENSIVE METABOLIC PANEL - Abnormal; Notable for the following components:   Potassium 3.3 (*)    All other components within normal limits  HEMOGLOBIN AND HEMATOCRIT, BLOOD - Abnormal; Notable for the following components:   Hemoglobin 7.9 (*)    HCT 28.6 (*)    All other components within normal limits  RETICULOCYTES - Abnormal; Notable for the following components:   Immature Retic Fract 43.9 (*)    All other components within normal limits  HEMOGLOBIN AND HEMATOCRIT, BLOOD - Abnormal; Notable for the following components:   Hemoglobin 7.9 (*)    HCT 28.9 (*)    All other components within normal limits  RESP PANEL BY RT-PCR (FLU A&B, COVID) ARPGX2  LACTATE DEHYDROGENASE  PATHOLOGIST SMEAR REVIEW  I-STAT BETA HCG BLOOD, ED ([facility], [facility], [facility] ONLY)  POC OCCULT  BLOOD, ED  TYPE AND SCREEN  PREPARE RBC (CROSSMATCH)    EKG None  Radiology No results found.  Procedures Procedures    Medications Ordered in ED Medications  0.9 %  sodium chloride infusion (0 mL/hr Intravenous Stopped 08/15/21 0552)  potassium chloride SA (KLOR-CON M) CR tablet 20 mEq (20 mEq Oral Given 08/15/21 0252)    ED Course/ Medical Decision Making/ A&P Clinical Course as of 08/15/21 0721  Mon Aug 15, 2021  0508 Hemoglobin(!): 7.5 [JL]  0508 Fecal Occult Blood, POC: NEGATIVE [JL]    Clinical Course User Index [JL] Regan Lemming, MD                           Medical Decision Making  26 year old female with a medical history significant for menorrhagia, uterine fibroids, iron deficiency anemia, G6PD deficiency anemia, depression with psychotic features, prior history of blood transfusions with a baseline hemoglobin around 9 who presents to the emergency department with lightheadedness and fatigue.  The patient states that she has been fatigued and felt lightheaded for the past several days.  She has a history of blood transfusions for symptomatic anemia in the past.  She has iron deficiency anemia and G6PD anemia that is complicated by episodes of menorrhagia with associated uterine fibroids.  She denies any vaginal bleeding at this time.  She currently does not have a PCP or an OB/GYN.  On arrival, the patient was afebrile, hemodynamically stable, with reassuring vitals and normal sinus rhythm noted on cardiac telemetry.  Patient physical exam generally unremarkable.  She is not currently having vaginal bleeding.  She presents with concern for symptomatic anemia with known iron deficiency and history of menorrhagia.    Work-up initiated to include fecal occult blood which was collected and resulted negative.  No concern for GI bleed at this time with no history of melena or hematochezia and a negative fecal occult.  hCG was negative, CMP generally unremarkable with mild  hypokalemia to 3.3, replenished orally, CBC with anemia to 7.5 down from 9.33 months ago.  Patient's baseline appears to be hemoglobin of around 9. MCV of 69.  Concern for iron deficiency anemia with additional complication of menorrhagia and known history of G6PD deficiency anemia and uterine fibroids.  COVID-19 and influenza PCR testing was collected and resulted negative.  I did speak with hospitalist medicine for consideration for admission given the patient's symptomatic anemia.  After discussion of the patient's presentation clinically, recommendations included administration of 1 unit PRBCs in the emergency department, reassessment and have the patient  follow-up with an OB/GYN outpatient for further evaluation of her menorrhagia and uterine fibroids.  Patient additionally needs to establish care with a PCP for management of her chronic conditions.  LDH was collected and was reassuring.  Hemoglobin posttransfusion notably 7.9. Pt stable for discharge.  Recommended follow-up outpatient to establish care with a PCP and also recommended follow-up outpatient with an OB/GYN to discuss her uterine fibroids and menorrhagia.  Final Clinical Impression(s) / ED Diagnoses Final diagnoses:  Symptomatic anemia    Rx / DC Orders ED Discharge Orders          Ordered    Ambulatory referral to Obstetrics / Gynecology        08/15/21 6761              Regan Lemming, MD 08/15/21 [phone]

## 2021-08-15 LAB — PREPARE RBC (CROSSMATCH)

## 2021-08-15 LAB — HEMOGLOBIN AND HEMATOCRIT, BLOOD
HCT: 28.6 % — ABNORMAL LOW (ref 36.0–46.0)
HCT: 28.9 % — ABNORMAL LOW (ref 36.0–46.0)
Hemoglobin: 7.9 g/dL — ABNORMAL LOW (ref 12.0–15.0)
Hemoglobin: 7.9 g/dL — ABNORMAL LOW (ref 12.0–15.0)

## 2021-08-15 LAB — RESP PANEL BY RT-PCR (FLU A&B, COVID) ARPGX2
Influenza A by PCR: NEGATIVE
Influenza B by PCR: NEGATIVE
SARS Coronavirus 2 by RT PCR: NEGATIVE

## 2021-08-15 LAB — RETICULOCYTES
Immature Retic Fract: 43.9 % — ABNORMAL HIGH (ref 2.3–15.9)
RBC.: 3.95 MIL/uL (ref 3.87–5.11)
Retic Count, Absolute: 106.3 10*3/uL (ref 19.0–186.0)
Retic Ct Pct: 2.7 % (ref 0.4–3.1)

## 2021-08-15 LAB — PATHOLOGIST SMEAR REVIEW

## 2021-08-15 LAB — POC OCCULT BLOOD, ED: Fecal Occult Bld: NEGATIVE

## 2021-08-15 LAB — LACTATE DEHYDROGENASE: LDH: 124 U/L (ref 98–192)

## 2021-08-15 MED ORDER — SODIUM CHLORIDE 0.9 % IV SOLN
10.0000 mL/h | Freq: Once | INTRAVENOUS | Status: AC
  Administered 2021-08-15: 10 mL/h via INTRAVENOUS

## 2021-08-15 MED ORDER — POTASSIUM CHLORIDE CRYS ER 20 MEQ PO TBCR
20.0000 meq | EXTENDED_RELEASE_TABLET | Freq: Once | ORAL | Status: AC
  Administered 2021-08-15: 20 meq via ORAL
  Filled 2021-08-15: qty 1

## 2021-08-15 NOTE — Discharge Instructions (Addendum)
You were evaluated in the Emergency Department and after careful evaluation, we did not find any emergent condition requiring admission or further testing in the hospital.  Your exam/testing today was overall reassuring.  You received a blood transfusion for symptomatic anemia.  Recommend you follow-up with OB/GYN for your heavy menstrual bleeding and known uterine fibroids.  Additionally recommend you establish care with a PCP in the area given your history of G6PD.   Please return to the Emergency Department if you experience any worsening of your condition.  Thank you for allowing [place] to be a part of your care.

## 2021-08-16 LAB — TYPE AND SCREEN
ABO/RH(D): A POS
Antibody Screen: NEGATIVE
Unit division: 0

## 2021-08-16 LAB — BPAM RBC
Blood Product Expiration Date: 202301262359
ISSUE DATE / TIME: 202301090259
Unit Type and Rh: 6200

## 2022-05-29 ENCOUNTER — Other Ambulatory Visit: Payer: Self-pay

## 2022-05-29 ENCOUNTER — Ambulatory Visit (HOSPITAL_COMMUNITY)
Admission: EM | Admit: 2022-05-29 | Discharge: 2022-05-29 | Disposition: A | Discharge status: Home / Self Care | Payer: Medicaid Other | Attending: Internal Medicine | Admitting: Internal Medicine

## 2022-05-29 ENCOUNTER — Encounter (HOSPITAL_COMMUNITY): Payer: Self-pay | Admitting: *Deleted

## 2022-05-29 DIAGNOSIS — M62838 Other muscle spasm: Secondary | ICD-10-CM

## 2022-05-29 DIAGNOSIS — Z3202 Encounter for pregnancy test, result negative: Secondary | ICD-10-CM | POA: Diagnosis not present

## 2022-05-29 DIAGNOSIS — M25512 Pain in left shoulder: Secondary | ICD-10-CM

## 2022-05-29 LAB — POC URINE PREG, ED: Preg Test, Ur: NEGATIVE

## 2022-05-29 MED ORDER — KETOROLAC TROMETHAMINE 30 MG/ML IJ SOLN
INTRAMUSCULAR | Status: AC
  Filled 2022-05-29: qty 1

## 2022-05-29 MED ORDER — CYCLOBENZAPRINE HCL 5 MG PO TABS: 5.0000 mg | ORAL_TABLET | Freq: Three times a day (TID) | ORAL | 0 refills | Status: DC | PRN

## 2022-05-29 MED ORDER — KETOROLAC TROMETHAMINE 30 MG/ML IJ SOLN
30.0000 mg | Freq: Once | INTRAMUSCULAR | Status: AC
  Administered 2022-05-29: 30 mg via INTRAMUSCULAR

## 2022-05-29 NOTE — ED Triage Notes (Signed)
Pt reports injury to RT shoulder at work yesterday. 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 Pt has Rt shoulder pain that radiates down arm to wrist on RT side.

## 2022-05-29 NOTE — Discharge Instructions (Signed)
Recommend ice to affected area and range of motion exercises as we discussed.  Can take Flexeril as needed for muscle spasm Can take Ibuprofen or Tylenol as needed for pain  If no improvement follow up with orthopedics

## 2022-05-29 NOTE — ED Provider Notes (Addendum)
[place]    CSN: [id number] Arrival date & time: 05/29/22  1900      History   Chief Complaint Chief Complaint  Patient presents with   Shoulder Pain   Arm Pain   Wrist Pain   Possible Pregnancy    HPI Sandra Macdonald is a 26 y.o. female.   Pt complains of left shoulder pain that started after she was lifting heavy item at work.  Reports required lifting overhead.  Denies immediate onset of pain.  Reports gradual onset.  She now reports radiation of pain to the wrist.  Denies numbness or tingling.  She denies neck pain.  She has taken nothing for the sx.  Pt requests pregnancy test today.     Past Medical History:  Diagnosis Date   Depression    Fibroid    G6PD deficiency    Supervision of normal first pregnancy, antepartum 02/18/2018    Nursing Staff Provider Office Location  [facility] Dating  LMP/6 week [place]  Language   English  Anatomy US   Flu Vaccine  n/a Genetic Screen  NIPS: low risk, female  AFP:   TDaP vaccine    Hgb A1C or  GTT Early  Third trimester  Rhogam     LAB RESULTS  Feeding Plan Breast Blood Type A/Positive/-- (07/15 0942)  Contraception  Antibody Negative (07/15 0942) Circumcision  Rubella 3.00 (07/15 0942) Pediatricia    Patient Active Problem List   Diagnosis Date Noted   History of laparoscopic cholecystectomy 05/05/2021   History of ERCP 05/05/2021   History of cholelithiasis 05/05/2021   History of command hallucinations    Iron deficiency anemia 12/28/2020   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   Psychosis ([place]) 12/03/2019   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   Incompetent cervix in pregnancy, antepartum, 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   Vaginal bleeding before [redacted] weeks gestation 03/20/2018   SAB (spontaneous abortion) 03/20/2018   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   G6PD deficiency anemia ([place]) 01/14/2018   Fibroids 12/30/2017    Past Surgical History:  Procedure Laterality Date   CHOLECYSTECTOMY N/A 05/04/2021    Procedure: LAPAROSCOPIC CHOLECYSTECTOMY;  Surgeon: Armandina Gemma, MD;  Location: [facility];  Service: General;  Laterality: N/A;   ERCP N/A 05/03/2021   Procedure: ENDOSCOPIC RETROGRADE CHOLANGIOPANCREATOGRAPHY (ERCP);  Surgeon: Arta Silence, MD;  Location: [facility];  Service: Endoscopy;  Laterality: N/A;   NO PAST SURGERIES     PANCREATIC STENT PLACEMENT  05/03/2021   Procedure: PANCREATIC STENT PLACEMENT;  Surgeon: Arta Silence, MD;  Location: [facility];  Service: Endoscopy;;   REMOVAL OF STONES  05/03/2021   Procedure: REMOVAL OF STONES;  Surgeon: Arta Silence, MD;  Location: [facility];  Service: Endoscopy;;   SPHINCTEROTOMY  05/03/2021   Procedure: Joan Mayans;  Surgeon: Arta Silence, MD;  Location: [facility];  Service: Endoscopy;;    OB History     Gravida  2   Para      Term      Preterm      AB  2   Living         SAB  2   IAB      Ectopic      Multiple      Live Births               Home Medications    Prior to Admission medications   Medication Sig Start Date End Date Taking? Authorizing Provider  cyclobenzaprine (FLEXERIL) 5 MG tablet Take  1 tablet (5 mg total) by mouth 3 (three) times daily as needed for muscle spasms. 05/29/22  Yes Ward, Lenise Arena, PA-C    Family History Family History  Problem Relation Age of Onset   Mental illness Mother    ADD / ADHD Neg Hx    Anxiety disorder Neg Hx    Alcohol abuse Neg Hx    Arthritis Neg Hx    Asthma Neg Hx    Cancer Neg Hx    Birth defects Neg Hx    Depression Neg Hx    COPD Neg Hx    Diabetes Neg Hx    Drug abuse Neg Hx    Early death Neg Hx    Hearing loss Neg Hx    Heart disease Neg Hx    Hyperlipidemia Neg Hx    Hypertension Neg Hx    Intellectual disability Neg Hx    Kidney disease Neg Hx    Learning disabilities Neg Hx    Miscarriages / Stillbirths Neg Hx    Stroke Neg Hx    Obesity Neg Hx    Vision loss Neg Hx    Varicose Veins Neg Hx     Social  History Social History   Tobacco Use   Smoking status: Never   Smokeless tobacco: Never  Vaping Use   Vaping Use: Never used  Substance Use Topics   Alcohol use: Not Currently    Alcohol/week: 1.0 standard drink of alcohol    Types: 1 Standard drinks or equivalent per week   Drug use: Not Currently    Frequency: 14.0 times per week    Types: Marijuana    Comment: pt reports 1-2 blunts on work days, more on days off     Allergies   Bactrim [sulfamethoxazole-trimethoprim], Blue dyes (parenteral), Codeine, Ibuprofen, Other, and Penicillins   Review of Systems Review of Systems  Constitutional:  Negative for chills and fever.  HENT:  Negative for ear pain and sore throat.   Eyes:  Negative for pain and visual disturbance.  Respiratory:  Negative for cough and shortness of breath.   Cardiovascular:  Negative for chest pain and palpitations.  Gastrointestinal:  Negative for abdominal pain and vomiting.  Genitourinary:  Negative for dysuria and hematuria.  Musculoskeletal:  Positive for arthralgias (left shoulder pain). Negative for back pain.  Skin:  Negative for color change and rash.  Neurological:  Negative for seizures and syncope.  All other systems reviewed and are negative.    Physical Exam Triage Vital Signs ED Triage Vitals  Enc Vitals Group     BP 05/29/22 1946 (!) 113/56     Pulse Rate 05/29/22 1946 86     Resp 05/29/22 1946 18     Temp 05/29/22 1946 99.1 F (37.3 C)     Temp src --      SpO2 05/29/22 1946 100 %     Weight --      Height --      Head Circumference --      Peak Flow --      Pain Score 05/29/22 1942 8     Pain Loc --      Pain Edu? --      Excl. in [place]? --    No data found.  Updated Vital Signs BP (!) 113/56   Pulse 86   Temp 99.1 F (37.3 C)   Resp 18   LMP  (LMP Unknown) Comment: posssible preganacy 05/29/22  SpO2 100%  Visual Acuity Right Eye Distance:   Left Eye Distance:   Bilateral Distance:    Right Eye Near:    Left Eye Near:    Bilateral Near:     Physical Exam Vitals and nursing note reviewed.  Constitutional:      General: She is not in acute distress.    Appearance: She is well-developed.  HENT:     Head: Normocephalic and atraumatic.  Eyes:     Conjunctiva/sclera: Conjunctivae normal.  Cardiovascular:     Rate and Rhythm: Normal rate and regular rhythm.     Heart sounds: No murmur heard. Pulmonary:     Effort: Pulmonary effort is normal. No respiratory distress.     Breath sounds: Normal breath sounds.  Abdominal:     Palpations: Abdomen is soft.     Tenderness: There is no abdominal tenderness.  Musculoskeletal:        General: No swelling.     Cervical back: Neck supple.     Comments: Pt guarding left arm with adduction and internal rotation.  Normal passive ROM without pain.  Mild TTP and spasm noted to left trapezius.  Normal cervical ROM.  Normal strength of left arm.   Skin:    General: Skin is warm and dry.     Capillary Refill: Capillary refill takes less than 2 seconds.  Neurological:     Mental Status: She is alert.  Psychiatric:        Mood and Affect: Mood normal.      UC Treatments / Results  Labs (all labs ordered are listed, but only abnormal results are displayed) Labs Reviewed  POC URINE PREG, ED    EKG   Radiology No results found.  Procedures Procedures (including critical care time)  Medications Ordered in UC Medications  ketorolac (TORADOL) 30 MG/ML injection 30 mg (30 mg Intramuscular Given 05/29/22 2021)    Initial Impression / Assessment and Plan / UC Course  I have reviewed the triage vital signs and the nursing notes.  Pertinent labs & imaging results that were available during my care of the patient were reviewed by me and considered in my medical decision making (see chart for details).   Urine pregnancy request, unknown LMP.  POC pregnancy negative in clinic today.   Left shoulder pain.  Supportive care discussed. Flexeril  prescribed to take as needed for muscle spasm.  Return precautions provided.  If no improvement advised follow up with ortho.  Final Clinical Impressions(s) / UC Diagnoses   Final diagnoses:  Acute pain of left shoulder  Muscle spasm  Pregnancy test negative     Discharge Instructions      Recommend ice to affected area and range of motion exercises as we discussed.  Can take Flexeril as needed for muscle spasm Can take Ibuprofen or Tylenol as needed for pain  If no improvement follow up with orthopedics      ED Prescriptions     Medication Sig Dispense Auth. Provider   cyclobenzaprine (FLEXERIL) 5 MG tablet Take 1 tablet (5 mg total) by mouth 3 (three) times daily as needed for muscle spasms. 30 tablet Ward, Lenise Arena, PA-C      PDMP not reviewed this encounter.   Ward, Lenise Arena, PA-C 05/29/22 2041    Ward, Lenise Arena, PA-C 06/12/22 1709    Ward, Lenise Arena, PA-C 06/12/22 1710

## 2022-09-14 ENCOUNTER — Encounter (HOSPITAL_COMMUNITY): Payer: Self-pay | Admitting: *Deleted

## 2022-09-14 ENCOUNTER — Ambulatory Visit (HOSPITAL_COMMUNITY)
Admission: EM | Admit: 2022-09-14 | Discharge: 2022-09-14 | Disposition: A | Discharge status: Home / Self Care | Payer: Medicaid Other | Attending: Internal Medicine | Admitting: Internal Medicine

## 2022-09-14 DIAGNOSIS — J069 Acute upper respiratory infection, unspecified: Secondary | ICD-10-CM | POA: Diagnosis not present

## 2022-09-14 DIAGNOSIS — Z20822 Contact with and (suspected) exposure to covid-19: Secondary | ICD-10-CM

## 2022-09-14 NOTE — ED Triage Notes (Signed)
Pt states her mother tested positive for COVID yesterday. Pts sx started 2 days ago with headache, nausea, sore throat, headache. She states not taking any meds for sx.

## 2022-09-14 NOTE — Discharge Instructions (Addendum)
Please maintain adequate hydration Please take Tylenol as needed for pain and/or fever We will call you with recommendations if labs are abnormal Please quarantine until lab results are available Return to urgent care if you have worsening symptoms.

## 2022-09-14 NOTE — ED Provider Notes (Signed)
[place]    CSN: [id number] Arrival date & time: 09/14/22  1120      History   Chief Complaint Chief Complaint  Patient presents with   Covid Exposure   Sore Throat   Headache   Nausea    HPI Sandra Macdonald is a 27 y.o. female comes to the urgent care for COVID testing.  Patient was exposed to confirmed case of COVID-19.  Patient started experiencing symptoms a couple of days ago.  She complains of a headache, sore throat and nausea.  No dizziness, near syncope or syncopal episodes.  Patient is not vaccinated against COVID-19 virus.  She contracted COVID-19 infection about a year ago.  HPI  Past Medical History:  Diagnosis Date   Depression    Fibroid    G6PD deficiency    Supervision of normal first pregnancy, antepartum 02/18/2018    Nursing Staff Provider Office Location  [facility] Dating  LMP/6 week [place]  Language   English  Anatomy US   Flu Vaccine  n/a Genetic Screen  NIPS: low risk, female  AFP:   TDaP vaccine    Hgb A1C or  GTT Early  Third trimester  Rhogam     LAB RESULTS  Feeding Plan Breast Blood Type A/Positive/-- (07/15 0942)  Contraception  Antibody Negative (07/15 0942) Circumcision  Rubella 3.00 (07/15 0942) Pediatricia    Patient Active Problem List   Diagnosis Date Noted   History of laparoscopic cholecystectomy 05/05/2021   History of ERCP 05/05/2021   History of cholelithiasis 05/05/2021   History of command hallucinations    Iron deficiency anemia 12/28/2020   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   Psychosis ([place]) 12/03/2019   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   Incompetent cervix in pregnancy, antepartum, 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   Vaginal bleeding before [redacted] weeks gestation 03/20/2018   SAB (spontaneous abortion) 03/20/2018   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   G6PD deficiency anemia ([place]) 01/14/2018   Fibroids 12/30/2017    Past Surgical History:  Procedure Laterality Date   CHOLECYSTECTOMY N/A 05/04/2021    Procedure: LAPAROSCOPIC CHOLECYSTECTOMY;  Surgeon: Armandina Gemma, MD;  Location: [facility];  Service: General;  Laterality: N/A;   ERCP N/A 05/03/2021   Procedure: ENDOSCOPIC RETROGRADE CHOLANGIOPANCREATOGRAPHY (ERCP);  Surgeon: Arta Silence, MD;  Location: [facility];  Service: Endoscopy;  Laterality: N/A;   NO PAST SURGERIES     PANCREATIC STENT PLACEMENT  05/03/2021   Procedure: PANCREATIC STENT PLACEMENT;  Surgeon: Arta Silence, MD;  Location: [facility];  Service: Endoscopy;;   REMOVAL OF STONES  05/03/2021   Procedure: REMOVAL OF STONES;  Surgeon: Arta Silence, MD;  Location: [facility];  Service: Endoscopy;;   SPHINCTEROTOMY  05/03/2021   Procedure: Joan Mayans;  Surgeon: Arta Silence, MD;  Location: [facility];  Service: Endoscopy;;    OB History     Gravida  2   Para      Term      Preterm      AB  2   Living         SAB  2   IAB      Ectopic      Multiple      Live Births               Home Medications    Prior to Admission medications   Medication Sig Start Date End Date Taking? Authorizing Provider  cyclobenzaprine (FLEXERIL) 5 MG tablet Take 1 tablet (5 mg total) by mouth 3 (  three) times daily as needed for muscle spasms. 05/29/22   Ward, Lenise Arena, PA-C    Family History Family History  Problem Relation Age of Onset   Mental illness Mother    ADD / ADHD Neg Hx    Anxiety disorder Neg Hx    Alcohol abuse Neg Hx    Arthritis Neg Hx    Asthma Neg Hx    Cancer Neg Hx    Birth defects Neg Hx    Depression Neg Hx    COPD Neg Hx    Diabetes Neg Hx    Drug abuse Neg Hx    Early death Neg Hx    Hearing loss Neg Hx    Heart disease Neg Hx    Hyperlipidemia Neg Hx    Hypertension Neg Hx    Intellectual disability Neg Hx    Kidney disease Neg Hx    Learning disabilities Neg Hx    Miscarriages / Stillbirths Neg Hx    Stroke Neg Hx    Obesity Neg Hx    Vision loss Neg Hx    Varicose Veins Neg Hx     Social History Social  History   Tobacco Use   Smoking status: Never   Smokeless tobacco: Never  Vaping Use   Vaping Use: Never used  Substance Use Topics   Alcohol use: Not Currently    Alcohol/week: 1.0 standard drink of alcohol    Types: 1 Standard drinks or equivalent per week   Drug use: Not Currently    Frequency: 14.0 times per week    Types: Marijuana    Comment: pt reports 1-2 blunts on work days, more on days off     Allergies   Bactrim [sulfamethoxazole-trimethoprim], Blue dyes (parenteral), Codeine, Ibuprofen, Other, and Penicillins   Review of Systems Review of Systems As per HPI  Physical Exam Triage Vital Signs ED Triage Vitals  Enc Vitals Group     BP 09/14/22 1222 110/68     Pulse Rate 09/14/22 1222 87     Resp 09/14/22 1222 18     Temp 09/14/22 1222 98.2 F (36.8 C)     Temp Source 09/14/22 1222 Oral     SpO2 09/14/22 1222 99 %     Weight --      Height --      Head Circumference --      Peak Flow --      Pain Score 09/14/22 1221 6     Pain Loc --      Pain Edu? --      Excl. in [place]? --    No data found.  Updated Vital Signs BP 110/68 (BP Location: Right Arm)   Pulse 87   Temp 98.2 F (36.8 C) (Oral)   Resp 18   LMP 09/05/2022   SpO2 99%   Visual Acuity Right Eye Distance:   Left Eye Distance:   Bilateral Distance:    Right Eye Near:   Left Eye Near:    Bilateral Near:     Physical Exam Vitals and nursing note reviewed.  Constitutional:      Appearance: She is not ill-appearing.  HENT:     Mouth/Throat:     Mouth: Mucous membranes are moist. Mucous membranes are pale.     Pharynx: Posterior oropharyngeal erythema present.  Eyes:     Conjunctiva/sclera: Conjunctivae normal.  Cardiovascular:     Rate and Rhythm: Normal rate and regular rhythm.  Neurological:  Mental Status: She is alert.      UC Treatments / Results  Labs (all labs ordered are listed, but only abnormal results are displayed) Labs Reviewed  SARS CORONAVIRUS 2 (TAT 6-24  HRS)    EKG   Radiology No results found.  Procedures Procedures (including critical care time)  Medications Ordered in UC Medications - No data to display  Initial Impression / Assessment and Plan / UC Course  I have reviewed the triage vital signs and the nursing notes.  Pertinent labs & imaging results that were available during my care of the patient were reviewed by me and considered in my medical decision making (see chart for details).     1.  Viral URI: 2.  Close exposure to a confirmed case of COVID-19: Increase oral fluid intake Tylenol as needed for pain/or fever Return precautions given. COVID-19 PCR test has been sent Will call patient with recommendations if labs are abnormal. Final Clinical Impressions(s) / UC Diagnoses   Final diagnoses:  Exposure to confirmed case of COVID-19  Viral URI     Discharge Instructions      Please maintain adequate hydration Please take Tylenol as needed for pain and/or fever We will call you with recommendations if labs are abnormal Please quarantine until lab results are available Return to urgent care if you have worsening symptoms.   ED Prescriptions   None    PDMP not reviewed this encounter.   Chase Picket, MD 09/14/22 [phone]

## 2022-09-15 LAB — SARS CORONAVIRUS 2 (TAT 6-24 HRS): SARS Coronavirus 2: NEGATIVE

## 2022-12-24 ENCOUNTER — Encounter (HOSPITAL_COMMUNITY): Payer: Self-pay | Admitting: Emergency Medicine

## 2022-12-24 ENCOUNTER — Inpatient Hospital Stay (HOSPITAL_COMMUNITY)
Admission: AD | Admit: 2022-12-24 | Discharge: 2022-12-29 | DRG: 885 | Disposition: A | Discharge status: Home / Self Care | Payer: Medicaid Other | Attending: Psychiatry | Admitting: Psychiatry

## 2022-12-24 ENCOUNTER — Encounter (HOSPITAL_COMMUNITY): Payer: Self-pay | Admitting: Psychiatry

## 2022-12-24 ENCOUNTER — Other Ambulatory Visit: Payer: Self-pay

## 2022-12-24 ENCOUNTER — Emergency Department (EMERGENCY_DEPARTMENT_HOSPITAL)
Admission: EM | Admit: 2022-12-24 | Discharge: 2022-12-24 | Disposition: A | Discharge status: Other Acute Inpatient Hospital | Payer: Medicaid Other | Source: Home / Self Care | Attending: Emergency Medicine | Admitting: Emergency Medicine

## 2022-12-24 DIAGNOSIS — F32A Depression, unspecified: Secondary | ICD-10-CM | POA: Insufficient documentation

## 2022-12-24 DIAGNOSIS — E663 Overweight: Secondary | ICD-10-CM | POA: Diagnosis present

## 2022-12-24 DIAGNOSIS — F29 Unspecified psychosis not due to a substance or known physiological condition: Secondary | ICD-10-CM | POA: Insufficient documentation

## 2022-12-24 DIAGNOSIS — R440 Auditory hallucinations: Secondary | ICD-10-CM | POA: Insufficient documentation

## 2022-12-24 DIAGNOSIS — G4700 Insomnia, unspecified: Secondary | ICD-10-CM | POA: Diagnosis present

## 2022-12-24 DIAGNOSIS — R45851 Suicidal ideations: Secondary | ICD-10-CM | POA: Diagnosis present

## 2022-12-24 DIAGNOSIS — D509 Iron deficiency anemia, unspecified: Secondary | ICD-10-CM | POA: Insufficient documentation

## 2022-12-24 DIAGNOSIS — F250 Schizoaffective disorder, bipolar type: Secondary | ICD-10-CM | POA: Diagnosis present

## 2022-12-24 DIAGNOSIS — Z79899 Other long term (current) drug therapy: Secondary | ICD-10-CM

## 2022-12-24 DIAGNOSIS — F411 Generalized anxiety disorder: Secondary | ICD-10-CM | POA: Diagnosis present

## 2022-12-24 DIAGNOSIS — F333 Major depressive disorder, recurrent, severe with psychotic symptoms: Secondary | ICD-10-CM | POA: Diagnosis present

## 2022-12-24 DIAGNOSIS — R400 Somnolence: Secondary | ICD-10-CM | POA: Insufficient documentation

## 2022-12-24 DIAGNOSIS — D550 Anemia due to glucose-6-phosphate dehydrogenase [G6PD] deficiency: Secondary | ICD-10-CM | POA: Diagnosis present

## 2022-12-24 DIAGNOSIS — Z818 Family history of other mental and behavioral disorders: Secondary | ICD-10-CM | POA: Diagnosis not present

## 2022-12-24 LAB — CBC WITH DIFFERENTIAL/PLATELET
Abs Immature Granulocytes: 0.03 10*3/uL (ref 0.00–0.07)
Basophils Absolute: 0.1 10*3/uL (ref 0.0–0.1)
Basophils Relative: 1 %
Eosinophils Absolute: 0.1 10*3/uL (ref 0.0–0.5)
Eosinophils Relative: 2 %
HCT: 28.2 % — ABNORMAL LOW (ref 36.0–46.0)
Hemoglobin: 7.8 g/dL — ABNORMAL LOW (ref 12.0–15.0)
Immature Granulocytes: 0 %
Lymphocytes Relative: 28 %
Lymphs Abs: 1.9 10*3/uL (ref 0.7–4.0)
MCH: 20.5 pg — ABNORMAL LOW (ref 26.0–34.0)
MCHC: 27.7 g/dL — ABNORMAL LOW (ref 30.0–36.0)
MCV: 74.2 fL — ABNORMAL LOW (ref 80.0–100.0)
Monocytes Absolute: 0.8 10*3/uL (ref 0.1–1.0)
Monocytes Relative: 11 %
Neutro Abs: 3.8 10*3/uL (ref 1.7–7.7)
Neutrophils Relative %: 58 %
Platelets: 481 10*3/uL — ABNORMAL HIGH (ref 150–400)
RBC: 3.8 MIL/uL — ABNORMAL LOW (ref 3.87–5.11)
RDW: 19.8 % — ABNORMAL HIGH (ref 11.5–15.5)
WBC: 6.7 10*3/uL (ref 4.0–10.5)
nRBC: 0 % (ref 0.0–0.2)

## 2022-12-24 LAB — COMPREHENSIVE METABOLIC PANEL
ALT: 21 U/L (ref 0–44)
AST: 17 U/L (ref 15–41)
Albumin: 3.5 g/dL (ref 3.5–5.0)
Alkaline Phosphatase: 42 U/L (ref 38–126)
Anion gap: 8 (ref 5–15)
BUN: 12 mg/dL (ref 6–20)
CO2: 26 mmol/L (ref 22–32)
Calcium: 8.9 mg/dL (ref 8.9–10.3)
Chloride: 106 mmol/L (ref 98–111)
Creatinine, Ser: 0.79 mg/dL (ref 0.44–1.00)
GFR, Estimated: >60 mL/min (ref >60)
Glucose, Bld: 106 mg/dL — ABNORMAL HIGH (ref 70–99)
Potassium: 3.7 mmol/L (ref 3.5–5.1)
Sodium: 140 mmol/L (ref 135–145)
Total Bilirubin: 0.2 mg/dL — ABNORMAL LOW (ref 0.3–1.2)
Total Protein: 7.5 g/dL (ref 6.5–8.1)

## 2022-12-24 LAB — I-STAT BETA HCG BLOOD, ED (MC, WL, AP ONLY): I-stat hCG, quantitative: <5 m[IU]/mL (ref <5)

## 2022-12-24 LAB — RAPID URINE DRUG SCREEN, HOSP PERFORMED
Amphetamines: NOT DETECTED
Barbiturates: NOT DETECTED
Benzodiazepines: NOT DETECTED
Cocaine: NOT DETECTED
Opiates: NOT DETECTED
Tetrahydrocannabinol: NOT DETECTED

## 2022-12-24 LAB — ACETAMINOPHEN LEVEL: Acetaminophen (Tylenol), Serum: <10 ug/mL — ABNORMAL LOW (ref 10–30)

## 2022-12-24 LAB — ETHANOL: Alcohol, Ethyl (B): <10 mg/dL (ref <10)

## 2022-12-24 LAB — SALICYLATE LEVEL: Salicylate Lvl: <7 mg/dL — ABNORMAL LOW (ref 7.0–30.0)

## 2022-12-24 MED ORDER — OLANZAPINE 2.5 MG PO TABS
2.5000 mg | ORAL_TABLET | Freq: Every day | ORAL | Status: DC
  Administered 2022-12-24: 2.5 mg via ORAL
  Filled 2022-12-24 (×3): qty 1

## 2022-12-24 MED ORDER — HALOPERIDOL LACTATE 5 MG/ML IJ SOLN: 5.0000 mg | Freq: Three times a day (TID) | INTRAMUSCULAR | Status: DC | PRN

## 2022-12-24 MED ORDER — FLUOXETINE HCL 10 MG PO CAPS
10.0000 mg | ORAL_CAPSULE | Freq: Every day | ORAL | Status: DC
  Administered 2022-12-25 – 2022-12-26 (×2): 10 mg via ORAL
  Filled 2022-12-24 (×4): qty 1

## 2022-12-24 MED ORDER — DIPHENHYDRAMINE HCL 25 MG PO CAPS: 50.0000 mg | ORAL_CAPSULE | Freq: Three times a day (TID) | ORAL | Status: DC | PRN

## 2022-12-24 MED ORDER — HYDROXYZINE HCL 25 MG PO TABS: 25.0000 mg | ORAL_TABLET | Freq: Three times a day (TID) | ORAL | Status: DC | PRN

## 2022-12-24 MED ORDER — BENZTROPINE MESYLATE 1 MG PO TABS: 1.0000 mg | ORAL_TABLET | Freq: Two times a day (BID) | ORAL | Status: DC | PRN

## 2022-12-24 MED ORDER — LORAZEPAM 2 MG/ML IJ SOLN: 2.0000 mg | Freq: Three times a day (TID) | INTRAMUSCULAR | Status: DC | PRN

## 2022-12-24 MED ORDER — FERROUS SULFATE 325 (65 FE) MG PO TABS
325.0000 mg | ORAL_TABLET | Freq: Once | ORAL | Status: AC
  Administered 2022-12-24: 325 mg via ORAL
  Filled 2022-12-24: qty 1

## 2022-12-24 MED ORDER — MAGNESIUM HYDROXIDE 400 MG/5ML PO SUSP: 30.0000 mL | Freq: Every day | ORAL | Status: DC | PRN

## 2022-12-24 MED ORDER — HALOPERIDOL 5 MG PO TABS: 5.0000 mg | ORAL_TABLET | Freq: Three times a day (TID) | ORAL | Status: DC | PRN

## 2022-12-24 MED ORDER — ALUM & MAG HYDROXIDE-SIMETH 200-200-20 MG/5ML PO SUSP: 30.0000 mL | ORAL | Status: DC | PRN

## 2022-12-24 MED ORDER — LORAZEPAM 1 MG PO TABS: 2.0000 mg | ORAL_TABLET | Freq: Three times a day (TID) | ORAL | Status: DC | PRN

## 2022-12-24 MED ORDER — TRAZODONE HCL 50 MG PO TABS
50.0000 mg | ORAL_TABLET | Freq: Every evening | ORAL | Status: DC | PRN
  Administered 2022-12-25: 50 mg via ORAL
  Filled 2022-12-24: qty 1

## 2022-12-24 MED ORDER — FLUOXETINE HCL 10 MG PO CAPS
10.0000 mg | ORAL_CAPSULE | Freq: Every day | ORAL | Status: DC
  Filled 2022-12-24: qty 1

## 2022-12-24 MED ORDER — HYDROXYZINE HCL 25 MG PO TABS
25.0000 mg | ORAL_TABLET | Freq: Three times a day (TID) | ORAL | Status: DC | PRN
  Administered 2022-12-26 – 2022-12-27 (×3): 25 mg via ORAL
  Filled 2022-12-24 (×3): qty 1

## 2022-12-24 MED ORDER — DIPHENHYDRAMINE HCL 50 MG/ML IJ SOLN: 50.0000 mg | Freq: Three times a day (TID) | INTRAMUSCULAR | Status: DC | PRN

## 2022-12-24 MED ORDER — ACETAMINOPHEN 325 MG PO TABS: 650.0000 mg | ORAL_TABLET | Freq: Four times a day (QID) | ORAL | Status: DC | PRN

## 2022-12-24 MED ORDER — OLANZAPINE 2.5 MG PO TABS: 2.5000 mg | ORAL_TABLET | Freq: Every day | ORAL | Status: DC

## 2022-12-24 NOTE — ED Notes (Signed)
Call received from pt Mother Dallana Schmude [phone] requesting rtn call for pt status/updates. [employer]

## 2022-12-24 NOTE — ED Notes (Signed)
[facility] Case # [id number]

## 2022-12-24 NOTE — ED Triage Notes (Signed)
  Patient comes in for psych evaluation due to hearing voices.  Patient states she has been hearing voices for about a month telling her that something bad was going to happen to her.  Patient states she has no SI/HI, and the voices never tell her to harm herself.  Patient states she was on medication for psych issues but has not taken it in about a year.  No complaints of pain.  Patient tearful in triage and states she just wants the voices to stop.

## 2022-12-24 NOTE — Consult Note (Addendum)
[facility] ED ASSESSMENT   Reason for Consult:  Psych Consult Referring Physician:  Harlene Salts, PA-C Patient Identification: [name] MRN:  [id number] ED Chief Complaint: Depression  Diagnosis:  Principal Problem:   Depression with psychosis    ED Assessment Time Calculation: No data recorded   HPI: Per Triage Note: Pt comes in for psych evaluation due to hearing voices. Patient states she has been hearing voices for about a month telling her that something bad was going to happen to her. Patient states she has no SI/HI, and the voices never tell her to harm herself. Patient states she was on medication for psych issues but has not taken it in about a year. No complaints of pain. Patient tearful in triage and states she just wants the voices to stop.    Subjective: Sandra Macdonald, 27 y.o., female patient seen face to face by this provider, consulted with Dr. Octavia Bruckner; and chart reviewed on 12/24/22.  On evaluation Sandra Macdonald reports that she has not been able to sleep due to anxiety and voices.  She states that she gets about 1 to 2 hours of sleep per night, states that the voices are telling her that she is going to die, and do not eat because she is being poisoned.  She states these voices started about a month or 2 ago, she does not remember if anything triggered her voices.  She states that she lives with her mom and sister, and she works at [employer].  Patient denies using any illicit drugs or alcohol UDS is negative and BAL is less than 10.  She states that she stopped smoking marijuana about 4 months ago due to increased paranoia.  Patient currently denies SI/HI/AVH.  States that she is scared to be by herself when she hears the voices, says she does not want to hurt herself but sometimes she feels like she wants to pick up a knife to stop the voices.  She states that her last admission to an inpatient psychiatric facility was April 2021 at [facility], says that she stopped taking her psychiatric meds  shortly after her discharge, and never did follow up with the outpatient psychiatric facility that was recommended.  During evaluation Cora Phaup is laying in hospital gurney in no acute distress. She is alert, oriented x 4, calm, cooperative and attentive. Her mood is sad with flat affect. She has normal speech, and behavior.  Objectively there is no evidence of psychosis/mania or delusional thinking. Patient is able to converse coherently, goal directed thoughts, no distractibility, or pre-occupation. She also currently denies suicidal/self-harm/homicidal ideation, psychosis, and paranoia.  Patient is appropriate and cooperative during evaluation.  Provider informed her that she is recommended inpatient and patient agreed to go to an inpatient facility.  Attempted to call patient's mother Constant Kroninger, with patient's permission, no answer x2 left the HIPAA compliant voicemail.  Past Psychiatric History: Hallucinations, depression   Risk to Self or Others: Risk to Self: No Risk to Others:  No  Prior Inpatient Therapy:  Yes  Prior Outpatient Therapy:  No    [place] Scale:  Flowsheet Row ED from 12/24/2022 in [facility] Emergency Department at [facility] ED from 09/14/2022 in [facility] at [facility] ED from 05/29/2022 in [facility] at [facility] RISK CATEGORY No Risk No Risk No Risk       AIMS:  , , ,  ,   ASAM:    Substance Abuse:  Past Medical History:  Past Medical History:  Diagnosis Date   Depression    Fibroid    G6PD deficiency    Supervision of normal first pregnancy, antepartum 02/18/2018    Nursing Staff Provider Office Location  [facility] Dating  LMP/6 week [place]  Language   English  Anatomy US   Flu Vaccine  n/a Genetic Screen  NIPS: low risk, female  AFP:   TDaP vaccine    Hgb A1C or  GTT Early  Third trimester  Rhogam     LAB RESULTS  Feeding Plan Breast Blood Type A/Positive/-- (07/15 0942)  Contraception  Antibody Negative (07/15  0942) Circumcision  Rubella 3.00 (07/15 0942) Pediatricia    Past Surgical History:  Procedure Laterality Date   CHOLECYSTECTOMY N/A 05/04/2021   Procedure: LAPAROSCOPIC CHOLECYSTECTOMY;  Surgeon: Darnell Level, MD;  Location: [facility] ORS;  Service: General;  Laterality: N/A;   ERCP N/A 05/03/2021   Procedure: ENDOSCOPIC RETROGRADE CHOLANGIOPANCREATOGRAPHY (ERCP);  Surgeon: Willis Modena, MD;  Location: [facility];  Service: Endoscopy;  Laterality: N/A;   NO PAST SURGERIES     PANCREATIC STENT PLACEMENT  05/03/2021   Procedure: PANCREATIC STENT PLACEMENT;  Surgeon: Willis Modena, MD;  Location: [facility]OSCOPY;  Service: Endoscopy;;   REMOVAL OF STONES  05/03/2021   Procedure: REMOVAL OF STONES;  Surgeon: Willis Modena, MD;  Location: [facility] END[facility]COPY;  Service: Endoscopy;;   SPHINCTEROTOMY  05/03/2021   Procedure: SPHINCTEROTOMY;  Surgeon: Willis Modena, MD;  Location: [facility]OS[facility];  Service: Endoscopy;;   Family History:  Family History  Problem Relation Age of Onset   Mental illness Mother    ADD / ADHD Neg Hx    Anxiety disorder Neg Hx    Alcohol abuse Neg Hx    Arthritis Neg Hx    Asthma Neg Hx    Cancer Neg Hx    Birth defects Neg Hx    Depression Neg Hx    COPD Neg Hx    Diabetes Neg Hx    Drug abuse Neg Hx    Early death Neg Hx    Hearing loss Neg Hx    Heart disease Neg Hx    Hyperlipidemia Neg Hx    Hypertension Neg Hx    Intellectual disability Neg Hx    Kidney disease Neg Hx    Learning disabilities Neg Hx    Miscarriages / Stillbirths Neg Hx    Stroke Neg Hx    Obesity Neg Hx    Vision loss Neg Hx    Varicose Veins Neg Hx    Social History:  Social History   Substance and Sexual Activity  Alcohol Use Not Currently   Alcohol/week: 1.0 standard drink of alcohol   Types: 1 Standard drinks or equivalent per week     Social History   Substance and Sexual Activity  Drug Use Not Currently   Frequency: 14.0 times per week   Types: Marijuana   Comment: pt  reports 1-2 blunts on work days, more on days off    Social History   Socioeconomic History   Marital status: Single    Spouse name: Not on file   Number of children: Not on file   Years of education: Not on file   Highest education level: 10th grade  Occupational History   Occupation: Disabled  Tobacco Use   Smoking status: Never   Smokeless tobacco: Never  Vaping Use   Vaping Use: Never used  Substance and Sexual Activity   Alcohol use: Not  Currently    Alcohol/week: 1.0 standard drink of alcohol    Types: 1 Standard drinks or equivalent per week   Drug use: Not Currently    Frequency: 14.0 times per week    Types: Marijuana    Comment: pt reports 1-2 blunts on work days, more on days off   Sexual activity: Yes    Birth control/protection: None  Other Topics Concern   Not on file  Social History Narrative   Pt lives with mother in [place].  She has an upcoming appointment with [facility] [place] (on June 2).     Social Determinants of Health   Financial Resource Strain: Low Risk  (09/16/2018)   Overall Financial Resource Strain (CARDIA)    Difficulty of Paying Living Expenses: Not very hard  Food Insecurity: No Food Insecurity (09/16/2018)   Hunger Vital Sign    Worried About Running Out of Food in the Last Year: Never true    Ran Out of Food in the Last Year: Never true  Transportation Needs: No Transportation Needs (09/16/2018)   PRAPARE - Administrator, Civil Service (Medical): No    Lack of Transportation (Non-Medical): No  Physical Activity: Inactive (09/16/2018)   Exercise Vital Sign    Days of Exercise per Week: 0 days    Minutes of Exercise per Session: 0 min  Stress: No Stress Concern Present (09/16/2018)   [employer] of Occupational Health - Occupational Stress Questionnaire    Feeling of Stress : Only a little  Social Connections: Moderately Isolated (09/16/2018)   Social Connection and Isolation Panel [NHANES]     Frequency of Communication with Friends and Family: Never    Frequency of Social Gatherings with Friends and Family: Never    Attends Religious Services: More than 4 times per year    Active Member of [employer] or Organizations: No    Attends Banker Meetings: Never    Marital Status: Never married      Allergies:   Allergies  Allergen Reactions   Bactrim [Sulfamethoxazole-Trimethoprim]     Trouble breathing, swelling   Blue Dyes (Parenteral) Other (See Comments)    G6PD-- Decreased hemoglobin   Codeine Other (See Comments)    Decreased hemoglobin   Ibuprofen     Trouble breathing, swelling   Other Other (See Comments)    G6PD--Decreased hemoglobin to potato chips (not potatoes in other forms)   Penicillins     Trouble breathing, swelling    Labs:  Results for orders placed or performed during the hospital encounter of 12/24/22 (from the past 48 hour(s))  Comprehensive metabolic panel     Status: Abnormal   Collection Time: 12/24/22  7:00 AM  Result Value Ref Range   Sodium 140 135 - 145 mmol/L   Potassium 3.7 3.5 - 5.1 mmol/L   Chloride 106 98 - 111 mmol/L   CO2 26 22 - 32 mmol/L   Glucose, Bld 106 (H) 70 - 99 mg/dL    Comment: Glucose reference range applies only to samples taken after fasting for at least 8 hours.   BUN 12 6 - 20 mg/dL   Creatinine, Ser 1.61 0.44 - 1.00 mg/dL   Calcium 8.9 8.9 - 09.6 mg/dL   Total Protein 7.5 6.5 - 8.1 g/dL   Albumin 3.5 3.5 - 5.0 g/dL   AST 17 15 - 41 U/L   ALT 21 0 - 44 U/L   Alkaline Phosphatase 42 38 - 126 U/L  Total Bilirubin 0.2 (L) 0.3 - 1.2 mg/dL   GFR, Estimated >16 >10 mL/min    Comment: (NOTE) Calculated using the CKD-EPI Creatinine Equation (2021)    Anion gap 8 5 - 15    Comment: Performed at [facility], [address] [place], [place] [address]  Ethanol     Status: None   Collection Time: 12/24/22  7:00 AM  Result Value Ref Range   Alcohol, Ethyl (B) <10 <10 mg/dL    Comment:  (NOTE) Lowest detectable limit for serum alcohol is 10 mg/dL.  For medical purposes only. Performed at [facility], [address], [place], [place] [address]   CBC with Diff     Status: Abnormal   Collection Time: 12/24/22  7:00 AM  Result Value Ref Range   WBC 6.7 4.0 - 10.5 K/uL   RBC 3.80 (L) 3.87 - 5.11 MIL/uL   Hemoglobin 7.8 (L) 12.0 - 15.0 g/dL    Comment: Reticulocyte Hemoglobin testing may be clinically indicated, consider ordering this additional test [id number]    HCT 28.2 (L) 36.0 - 46.0 %   MCV 74.2 (L) 80.0 - 100.0 fL   MCH 20.5 (L) 26.0 - 34.0 pg   MCHC 27.7 (L) 30.0 - 36.0 g/dL   RDW 56.2 (H) 13.0 - 86.5 %   Platelets 481 (H) 150 - 400 K/uL   nRBC 0.0 0.0 - 0.2 %   Neutrophils Relative % 58 %   Neutro Abs 3.8 1.7 - 7.7 K/uL   Lymphocytes Relative 28 %   Lymphs Abs 1.9 0.7 - 4.0 K/uL   Monocytes Relative 11 %   Monocytes Absolute 0.8 0.1 - 1.0 K/uL   Eosinophils Relative 2 %   Eosinophils Absolute 0.1 0.0 - 0.5 K/uL   Basophils Relative 1 %   Basophils Absolute 0.1 0.0 - 0.1 K/uL   Immature Granulocytes 0 %   Abs Immature Granulocytes 0.03 0.00 - 0.07 K/uL    Comment: Performed at [facility], [address] [place], [place] [address]  Salicylate level     Status: Abnormal   Collection Time: 12/24/22  7:00 AM  Result Value Ref Range   Salicylate Lvl <7.0 (L) 7.0 - 30.0 mg/dL    Comment: Performed at [facility], [address] [place], [place] [address]  Acetaminophen level     Status: Abnormal   Collection Time: 12/24/22  7:00 AM  Result Value Ref Range   Acetaminophen (Tylenol), Serum <10 (L) 10 - 30 ug/mL    Comment: (NOTE) Therapeutic concentrations vary significantly. A range of 10-30 ug/mL  may be an effective concentration for many patients. However, some  are best treated at concentrations outside of this range. Acetaminophen concentrations >150 ug/mL at 4 hours after ingestion  and  >50 ug/mL at 12 hours after ingestion are often associated with  toxic reactions.  Performed at [facility], [address] [place], [place] [address]   I-Stat beta hCG blood, ED     Status: None   Collection Time: 12/24/22  7:07 AM  Result Value Ref Range   I-stat hCG, quantitative <5.0 <5 mIU/mL   Comment 3            Comment:   GEST. AGE      CONC.  (mIU/mL)   <=1 WEEK        5 - 50     2 WEEKS       50 -  500     3 WEEKS       100 - 10,000     4 WEEKS     1,000 - 30,000        FEMALE AND NON-PREGNANT FEMALE:     LESS THAN 5 mIU/mL   Urine rapid drug screen (hosp performed)     Status: None   Collection Time: 12/24/22  9:36 AM  Result Value Ref Range   Opiates NONE DETECTED NONE DETECTED   Cocaine NONE DETECTED NONE DETECTED   Benzodiazepines NONE DETECTED NONE DETECTED   Amphetamines NONE DETECTED NONE DETECTED   Tetrahydrocannabinol NONE DETECTED NONE DETECTED   Barbiturates NONE DETECTED NONE DETECTED    Comment: (NOTE) DRUG SCREEN FOR MEDICAL PURPOSES ONLY.  IF CONFIRMATION IS NEEDED FOR ANY PURPOSE, NOTIFY LAB WITHIN 5 DAYS.  LOWEST DETECTABLE LIMITS FOR URINE DRUG SCREEN Drug Class                     Cutoff (ng/mL) Amphetamine and metabolites    1000 Barbiturate and metabolites    200 Benzodiazepine                 200 Opiates and metabolites        300 Cocaine and metabolites        300 THC                            50 Performed at [facility], [address], [place], [place] [address]     No current facility-administered medications for this encounter.   Current Outpatient Medications  Medication Sig Dispense Refill   cyclobenzaprine (FLEXERIL) 5 MG tablet Take 1 tablet (5 mg total) by mouth 3 (three) times daily as needed for muscle spasms. 30 tablet 0    Musculoskeletal: Strength & Muscle Tone: within normal limits Gait & Station: normal Patient leans: N/A   Psychiatric Specialty Exam: Presentation   General Appearance:  Appropriate for Environment  Eye Contact: Fleeting  Speech: Clear and Coherent  Speech Volume: Normal  Handedness: Right   Mood and Affect  Mood: Depressed  Affect: Appropriate   Thought Process  Thought Processes: Coherent  Descriptions of Associations:Intact  Orientation:Full (Time, Place and Person)  Thought Content:WDL  History of Schizophrenia/Schizoaffective disorder:No data recorded Duration of Psychotic Symptoms:No data recorded Hallucinations:Hallucinations: Auditory Description of Auditory Hallucinations: unknown voice that she is going to die  Ideas of Reference:None  Suicidal Thoughts:Suicidal Thoughts: No  Homicidal Thoughts:Homicidal Thoughts: No   Sensorium  Memory: Immediate Good; Remote Fair; Recent Good  Judgment: Fair  Insight: Fair   Art therapist  Concentration: Good  Attention Span: Good  Recall: Good  Fund of Knowledge: Good  Language: Good   Psychomotor Activity  Psychomotor Activity: Psychomotor Activity: Normal   Assets  Assets: Communication Skills; Desire for Improvement; Housing; Social Support    Sleep  Sleep: Sleep: Poor   Physical Exam: Physical Exam Pulmonary:     Effort: Pulmonary effort is normal.  Musculoskeletal:        General: Normal range of motion.     Cervical back: Normal range of motion.  Neurological:     Mental Status: She is alert.  Psychiatric:        Attention and Perception: Attention normal.        Mood and Affect: Mood is depressed. Affect is flat.        Speech: Speech normal.  Behavior: Behavior is cooperative.        Cognition and Memory: Memory normal.        Judgment: Judgment is inappropriate.     Comments: Auditory Hallucinations     Review of Systems  Constitutional: Negative.   HENT: Negative.    Respiratory: Negative.    Psychiatric/Behavioral:  Positive for depression and hallucinations.    Blood pressure  (!) 127/92, pulse 91, temperature 97.9 F (36.6 C), resp. rate 14, height 5\' 5"  (1.651 m), weight 81.6 kg, SpO2 100 %. Body mass index is 29.95 kg/m.  Medical Decision Making: Patient case review and discussed with Dr. Octavia Bruckner. Patient needs inpatient psychiatric admission for stabilization and treatment. Patient is danger to self, feels unsafe to be by herself and knives. Patient being reviewed at [facility].  Will restart on Prozac 10mg  daily for depression and Zyprexa 2.5 mg PO at bedtime for psychosis.   Per EDP note at 11 AM: Patient reassessed she is resting comfortably bed no acute distress, she has recently finished eating lunch. She reports no complaints at this time. She was made aware that her hemoglobin was 7.8 today. Suspect this is due to iron deficiency microcytic anemia. She reports she has not been taking her iron supplements, she reports that her anemia is due to heavy menstrual cycles, she denies any symptoms to suggest symptomatic anemia. I do not feel patient requires PRBC or other interventions at this time, she has been asked to start taking her iron supplements again and to follow-up closely with her OB/GYN. ER precautions were given. At this time there does not appear to be any evidence for acute emergency medical condition and patient appears stable for psychiatric evaluation and treatment.    Disposition: Recommend psychiatric Inpatient admission.    Alona Bene, PMHNP 12/24/2022 2:28 PM

## 2022-12-24 NOTE — Progress Notes (Addendum)
   12/24/22 2200  Psych Admission Type (Psych Patients Only)  Admission Status Involuntary  Psychosocial Assessment  Patient Complaints None  Eye Contact Fair  Facial Expression Flat  Affect Flat  Speech Logical/coherent  Interaction Assertive  Motor Activity Other (Comment) (wnl)  Appearance/Hygiene In scrubs  Behavior Characteristics Cooperative  Mood Other (Comment) (appropriate)  Thought Process  Coherency WDL  Content WDL  Delusions None reported or observed  Perception WDL  Hallucination None reported or observed  Judgment UTA  Confusion None  Danger to Self  Current suicidal ideation? Denies  Agreement Not to Harm Self Yes  Description of Agreement verbal  Danger to Others  Danger to Others None reported or observed   Progress note   D: Pt seen in search room on admission. Pt denies SI, HI, AVH. Pt rates pain  0/10. Pt rates anxiety  0/10 and depression  0/10. Pt would like to speak to a provider about being discharged. Pt encouraged to speak with provider in the morning. Explained IVC process, LOS, and process if allowed to sign voluntary. Pt verbalized understanding and has no other questions. No other concerns noted at this time.  A: Pt provided support and encouragement. Pt given scheduled medication as prescribed. PRNs as appropriate. Q15 min checks for safety.   R: Pt safe on the unit. Will continue to monitor.

## 2022-12-24 NOTE — ED Notes (Signed)
Patient belongings are located in the cabinet behind the nurses station for ED rooms 16-18

## 2022-12-24 NOTE — Progress Notes (Addendum)
Pt was accepted to [facility] TODAY 12/24/2022; Bed Assignment 508-02  Pt meets inpatient criteria per Alona Bene, PMHNP  Attending Physician will be Dr. Brunetta Genera  Report can be called to: -Adult unit: [phone]  Pt can arrive after: ROOM IS READY  Per [facility],Please call report to [phone] prior to transport. Provider, please include admission orders and agitation protocol. Registration, please pre-admit.  Care Team notified: Day [facility] Sharyne Peach, RN, Alona Bene, PMHNP, Latricia Heft, Paramedic, Nolberto Hanlon, RN, Jac Canavan, RN, Registration [address] [place], LCSWA 12/24/2022 @ 3:00 PM

## 2022-12-24 NOTE — ED Notes (Signed)
Pt contacted mother Sue Lush at [phone].

## 2022-12-24 NOTE — ED Notes (Addendum)
Report called to [facility] @[phone] to RN.

## 2022-12-24 NOTE — Progress Notes (Signed)
Patient ID: Sandra Macdonald, female   DOB: [birth year], 27 y.o.   MRN: [id number]  D: [facility] from [facility]. Pt denies SI/HI/AVH and pain at this time. Pt states that she wants to get back on her medication. Pt says that she came to the hospital because her mother wanted her to come. Pt stopped her medications (can't remember the names) a year ago because they made her drowsy. Pt works at [employer]. Lives at home with her mother. Pt denies ever coming to ED for worsening auditory hallucinations that began about a month ago.  Pt denies self-harm, history of suicidal ideations and attempts, access to firearms, drug use, alcohol use, tobacco use. Pt responds appropriately without delayed responses. Pt is A&O x4. Denies having a primary care provider. Denies poor appetite, anxiety, depression, insomnia or panic attacks. Denies stressors. Pt was at [facility] in 2021. Per [facility] ED assessment:  HPI: Per Triage Note: Pt comes in for psych evaluation due to hearing voices. Patient states she has been hearing voices for about a month telling her that something bad was going to happen to her. Patient states she has no SI/HI, and the voices never tell her to harm herself. Patient states she was on medication for psych issues but has not taken it in about a year. No complaints of pain. Patient tearful in triage and states she just wants the voices to stop.   Subjective: Sandra Macdonald, 27 y.o., female patient seen face to face by this provider, consulted with Dr. Octavia Bruckner; and chart reviewed on 12/24/22.  On evaluation Sandra Macdonald reports that she has not been able to sleep due to anxiety and voices.  She states that she gets about 1 to 2 hours of sleep per night, states that the voices are telling her that she is going to die, and do not eat because she is being poisoned.  She states these voices started about a month or 2 ago, she does not remember if anything triggered her voices.  She states that she lives with her mom and sister, and she  works at [employer].  Patient denies using any illicit drugs or alcohol UDS is negative and BAL is less than 10.  She states that she stopped smoking marijuana about 4 months ago due to increased paranoia.  Patient currently denies SI/HI/AVH.  States that she is scared to be by herself when she hears the voices, says she does not want to hurt herself but sometimes she feels like she wants to pick up a knife to stop the voices.  She states that her last admission to an inpatient psychiatric facility was April 2021 at [facility], says that she stopped taking her psychiatric meds shortly after her discharge, and never did follow up with the outpatient psychiatric facility that was recommended.   A: Pt was offered support and encouragement. Pt is cooperative during assessment. VS assessed and admission paperwork signed. Belongings searched and contraband items placed in locker. Non-invasive skin search completed: tattoo on inner right forearm and birthmark on right buttock. Pt offered food and drink and both refused. Pt introduced to unit milieu by nursing staff. Q 15 minute checks were started for safety. Pt took scheduled medication for this evening without incident.   R: Pt in room. Pt safety maintained on unit.

## 2022-12-24 NOTE — ED Notes (Signed)
Pt refused medication because she stated she do not take any medications at home.

## 2022-12-24 NOTE — ED Provider Notes (Signed)
[place] EMERGENCY DEPARTMENT AT [facility] Provider Note   CSN: [id number] Arrival date & time: 12/24/22  0543     History  Chief Complaint  Patient presents with   Psychiatric Evaluation    Sandra Macdonald is a 27 y.o. female history includes fibroids, depression, psychosis, G6PD deficiency anemia.  Patient presents for evaluation of auditory hallucinations.  She reports that she stopped her psychiatric medications last year due to drowsiness.  She reports that over the past month she has been hearing voices telling her that something bad is going to happen to her she denies suicidal or homicidal ideations and she denies visual hallucinations.  She denies any drug or alcohol use, she has no complaints of pain she denies any overdoses or attempts at self-harm.  She has no additional concerns today.  HPI     Home Medications Prior to Admission medications   Medication Sig Start Date End Date Taking? Authorizing Provider  cyclobenzaprine (FLEXERIL) 5 MG tablet Take 1 tablet (5 mg total) by mouth 3 (three) times daily as needed for muscle spasms. 05/29/22   Ward, Tylene Fantasia, PA-C      Allergies    Bactrim [sulfamethoxazole-trimethoprim], Blue dyes (parenteral), Codeine, Ibuprofen, Other, and Penicillins    Review of Systems   Review of Systems Ten systems are reviewed and are negative for acute change except as noted in the HPI  Physical Exam Updated Vital Signs BP 130/66 (BP Location: Left Arm)   Pulse 78   Temp 98.6 F (37 C) (Oral)   Resp 16   Ht 5\' 5"  (1.651 m)   Wt 81.6 kg   SpO2 100%   BMI 29.95 kg/m  Physical Exam Constitutional:      General: She is not in acute distress.    Appearance: Normal appearance. She is well-developed. She is not ill-appearing or diaphoretic.  HENT:     Head: Normocephalic and atraumatic.  Eyes:     General: Vision grossly intact. Gaze aligned appropriately.     Pupils: Pupils are equal, round, and reactive to light.   Neck:     Trachea: Trachea and phonation normal.  Pulmonary:     Effort: Pulmonary effort is normal. No respiratory distress.  Abdominal:     General: There is no distension.     Palpations: Abdomen is soft.     Tenderness: There is no abdominal tenderness. There is no guarding or rebound.  Musculoskeletal:        General: Normal range of motion.     Cervical back: Normal range of motion.  Skin:    General: Skin is warm and dry.  Neurological:     Mental Status: She is alert.     GCS: GCS eye subscore is 4. GCS verbal subscore is 5. GCS motor subscore is 6.     Comments: Speech is clear and goal oriented, follows commands Major Cranial nerves without deficit, no facial droop Moves extremities without ataxia, coordination intact  Psychiatric:        Attention and Perception: She perceives auditory hallucinations. She does not perceive visual hallucinations.        Mood and Affect: Mood is depressed.        Behavior: Behavior normal.        Thought Content: Thought content does not include homicidal or suicidal ideation.     ED Results / Procedures / Treatments   Labs (all labs ordered are listed, but only abnormal results are displayed) Labs Reviewed  COMPREHENSIVE METABOLIC PANEL - Abnormal; Notable for the following components:      Result Value   Glucose, Bld 106 (*)    Total Bilirubin 0.2 (*)    All other components within normal limits  CBC WITH DIFFERENTIAL/PLATELET - Abnormal; Notable for the following components:   RBC 3.80 (*)    Hemoglobin 7.8 (*)    HCT 28.2 (*)    MCV 74.2 (*)    MCH 20.5 (*)    MCHC 27.7 (*)    RDW 19.8 (*)    Platelets 481 (*)    All other components within normal limits  SALICYLATE LEVEL - Abnormal; Notable for the following components:   Salicylate Lvl <7.0 (*)    All other components within normal limits  ACETAMINOPHEN LEVEL - Abnormal; Notable for the following components:   Acetaminophen (Tylenol), Serum <10 (*)    All other  components within normal limits  ETHANOL  RAPID URINE DRUG SCREEN, HOSP PERFORMED  I-STAT BETA HCG BLOOD, ED ([facility], [facility], [facility] ONLY)    EKG None  Radiology No results found.  Procedures Procedures    Medications Ordered in ED Medications  ferrous sulfate tablet 325 mg (has no administration in time range)    ED Course/ Medical Decision Making/ A&P                             Medical Decision Making 27 year old female presented for auditory hallucinations x 1 month.  She reports discontinuation of her psychiatric medications 1 year ago due to their side effects of drowsiness.  She has no SI/HI but she is concerned because the voices are telling her that something bad will happen to her.  She denies any attempt to overdose or self-harm she has no other concerns at this time.  Amount and/or Complexity of Data Reviewed External Data Reviewed: notes.    Details: I reviewed behavioral health note from Dec 28, 2020.  She was seen in the behavioral health urgent care because she did not feel left was real and she thought she was dead.  They transferred her to the emergency department for concern of anemia as her hemoglobin was 6.9.  She was started on Zyprexa and Prozac.  It appears she was discharged on Dec 29, 2020.  She received 1 unit packed red blood cells and IV iron.  Labs: ordered.    Details: CBC shows hemoglobin of 7.8 which is similar to 1 year ago.  Shows microcytic anemia.  Platelets 481.  No leukopenia or leukocytosis. Tylenol/acetaminophen levels negative Pregnancy test negative Ethanol negative, patient does not appear to be in withdrawal. CMP shows no emergent electrolyte derangement, AKI, LFT elevations or gap.   11 AM: Patient reassessed she is resting comfortably bed no acute distress, she has recently finished eating lunch.  She reports no complaints at this time.  She was made aware that her hemoglobin was 7.8 today.  Suspect this is due to iron deficiency microcytic  anemia.  She reports she has not been taking her iron supplements, she reports that her anemia is due to heavy menstrual cycles, she denies any symptoms to suggest symptomatic anemia.  I do not feel patient requires PRBC or other interventions at this time, she has been asked to start taking her iron supplements again and to follow-up closely with her OB/GYN.  ER precautions were given.  At this time there does not appear to be any evidence for acute  emergency medical condition and patient appears stable for psychiatric evaluation and treatment.  Patient's case discussed with Dr. Estell Harpin who agrees with plan above.   Note: Portions of this report may have been transcribed using voice recognition software. Every effort was made to ensure accuracy; however, inadvertent computerized transcription errors may still be present.         Final Clinical Impression(s) / ED Diagnoses Final diagnoses:  Auditory hallucinations  Microcytic anemia    Rx / DC Orders ED Discharge Orders     None         Elizabeth Palau 12/24/22 1103    Bethann Berkshire, MD 12/26/22 1650

## 2022-12-24 NOTE — Tx Team (Signed)
Initial Treatment Plan 12/24/2022 11:14 PM Sandra Macdonald ZOX:[id number]    PATIENT STRESSORS: Other: none     PATIENT STRENGTHS: Average or above average intelligence  Physical Health  Supportive family/friends    PATIENT IDENTIFIED PROBLEMS: Auditory hallucinations  Medication non-compliance  ("Pt wants to get back on her medication.")                 DISCHARGE CRITERIA:  Improved stabilization in mood, thinking, and/or behavior Verbal commitment to aftercare and medication compliance  PRELIMINARY DISCHARGE PLAN: Outpatient therapy Return to previous living arrangement Return to previous work or school arrangements  PATIENT/FAMILY INVOLVEMENT: This treatment plan has been presented to and reviewed with the patient, Sandra Macdonald, and/or family member.  The patient and family have been given the opportunity to ask questions and make suggestions.  Victorino December, RN 12/24/2022, 11:14 PM

## 2022-12-24 NOTE — ED Notes (Signed)
Patients belongings were placed in locker 31 in [place] and secured

## 2022-12-25 ENCOUNTER — Encounter (HOSPITAL_COMMUNITY): Payer: Self-pay

## 2022-12-25 DIAGNOSIS — F411 Generalized anxiety disorder: Secondary | ICD-10-CM | POA: Insufficient documentation

## 2022-12-25 DIAGNOSIS — F250 Schizoaffective disorder, bipolar type: Secondary | ICD-10-CM | POA: Diagnosis not present

## 2022-12-25 LAB — HEMOGLOBIN A1C
Hgb A1c MFr Bld: 4.4 % — ABNORMAL LOW (ref 4.8–5.6)
Mean Plasma Glucose: 79.58 mg/dL

## 2022-12-25 LAB — TSH: TSH: 2.713 u[IU]/mL (ref 0.350–4.500)

## 2022-12-25 LAB — LIPID PANEL
Cholesterol: 136 mg/dL (ref 0–200)
HDL: 40 mg/dL — ABNORMAL LOW (ref >40)
LDL Cholesterol: 82 mg/dL (ref 0–99)
Total CHOL/HDL Ratio: 3.4 RATIO
Triglycerides: 72 mg/dL (ref <150)
VLDL: 14 mg/dL (ref 0–40)

## 2022-12-25 MED ORDER — ARIPIPRAZOLE 5 MG PO TABS
5.0000 mg | ORAL_TABLET | Freq: Every day | ORAL | Status: DC
  Administered 2022-12-25 – 2022-12-26 (×2): 5 mg via ORAL
  Filled 2022-12-25 (×5): qty 1

## 2022-12-25 NOTE — BHH Suicide Risk Assessment (Signed)
[facility] Admission Suicide Risk Assessment   Nursing information obtained from:  Patient Demographic factors:  Adolescent or young adult Current Mental Status:  NA Loss Factors:  NA Historical Factors:  Family history of mental illness or substance abuse (mother diagnosed bipolar, schizophrenic) Risk Reduction Factors:  Employed, Living with another person, especially a relative, Positive social support  Total Time spent with patient: 30 minutes Principal Problem: Schizoaffective disorder, bipolar type (HCC) Diagnosis:  Principal Problem:   Schizoaffective disorder, bipolar type (HCC) Active Problems:   G6PD deficiency anemia (HCC)   GAD (generalized anxiety disorder)  Subjective Data: See H&P   Continued Clinical Symptoms:  Alcohol Use Disorder Identification Test Final Score (AUDIT): 0 The "Alcohol Use Disorders Identification Test", Guidelines for Use in Primary Care, Second Edition.  World Science writer [facility]). Score between 0-7:  no or low risk or alcohol related problems. Score between 8-15:  moderate risk of alcohol related problems. Score between 16-19:  high risk of alcohol related problems. Score 20 or above:  warrants further diagnostic evaluation for alcohol dependence and treatment.   CLINICAL FACTORS:   Severe Anxiety and/or Agitation Bipolar Disorder:   Depressive phase Schizophrenia:   Command hallucinatons Depressive state Less than 54 years old Paranoid or undifferentiated type More than one psychiatric diagnosis Currently Psychotic Previous Psychiatric Diagnoses and Treatments    Psychiatric Specialty Exam:  Presentation  General Appearance:  Disheveled  Eye Contact: Poor  Speech: Slow  Speech Volume: Decreased  Handedness: Right   Mood and Affect  Mood: Anxious; Depressed  Affect: Depressed   Thought Process  Thought Processes: Linear  Descriptions of Associations:Intact  Orientation:Full (Time, Place and  Person)  Thought Content:Paranoid Ideation  History of Schizophrenia/Schizoaffective disorder:No  Duration of Psychotic Symptoms:Greater than six months  Hallucinations:Hallucinations: Auditory; Command; Visual Description of Auditory Hallucinations: unknown voice that she is going to die  Ideas of Reference:Paranoia  Suicidal Thoughts:Suicidal Thoughts: No  Homicidal Thoughts:Homicidal Thoughts: No   Sensorium  Memory: Immediate Fair; Recent Fair; Remote Fair  Judgment: Fair  Insight: Fair   Art therapist  Concentration: Fair  Attention Span: Fair  Recall: Good  Fund of Knowledge: Good  Language: Good   Psychomotor Activity  Psychomotor Activity: Psychomotor Activity: Normal   Assets  Assets: Communication Skills; Desire for Improvement; Housing; Social Support   Sleep  Sleep: Sleep: Poor    Physical Exam: Physical Exam See H&P  ROS See H&P  Blood pressure 115/63, pulse (!) 105, temperature 98.7 F (37.1 C), temperature source Oral, height 5\' 5"  (1.651 m), weight 104.3 kg, last menstrual period 12/19/2022, SpO2 100 %. Body mass index is 38.27 kg/m.   COGNITIVE FEATURES THAT CONTRIBUTE TO RISK:  None    SUICIDE RISK:   Moderate:  Frequent suicidal ideation with limited intensity, and duration, some specificity in terms of plans, no associated intent, good self-control, limited dysphoria/symptomatology, some risk factors present, and identifiable protective factors, including available and accessible social support.  PLAN OF CARE: See H&P   I certify that inpatient services furnished can reasonably be expected to improve the patient's condition.   Cristy Hilts, MD 12/25/2022, 10:54 AM

## 2022-12-25 NOTE — Group Note (Signed)
Date:  12/25/2022 Time:  9:27 PM  Group Topic/Focus:  Wrap-Up Group:   The focus of this group is to help patients review their daily goal of treatment and discuss progress on daily workbooks.    Participation Level:  Active  Participation Quality:  Appropriate  Affect:  Appropriate  Cognitive:  Appropriate  Insight: Appropriate  Engagement in Group:  Engaged  Modes of Intervention:  Education and Exploration  Additional Comments:  Patient attended and participated in group tonight. She reports that today she slept a lot.  She did went for her meals and spoke with her doctor and her mother.  Lita Mains [facility] 12/25/2022, 9:27 PM

## 2022-12-25 NOTE — Progress Notes (Signed)
Pt asking for sleep medication. PRNs given as appropriate.

## 2022-12-25 NOTE — BH IP Treatment Plan (Signed)
Interdisciplinary Treatment and Diagnostic Plan Update  12/25/2022 Time of Session: 11:55am Sandra Macdonald MRN: [id number]  Principal Diagnosis: Schizoaffective disorder, bipolar type (HCC)  Secondary Diagnoses: Principal Problem:   Schizoaffective disorder, bipolar type (HCC) Active Problems:   G6PD deficiency anemia (HCC)   GAD (generalized anxiety disorder)   Current Medications:  Current Facility-Administered Medications  Medication Dose Route Frequency Provider Last Rate Last Admin   acetaminophen (TYLENOL) tablet 650 mg  650 mg Oral Q6H PRN Motley-Mangrum, Jadeka A, PMHNP       alum & mag hydroxide-simeth (MAALOX/MYLANTA) 200-200-20 MG/5ML suspension 30 mL  30 mL Oral Q4H PRN Motley-Mangrum, Jadeka A, PMHNP       ARIPiprazole (ABILIFY) tablet 5 mg  5 mg Oral Daily Massengill, Nathan, MD   5 mg at 12/25/22 1253   diphenhydrAMINE (BENADRYL) capsule 50 mg  50 mg Oral TID PRN Motley-Mangrum, Jadeka A, PMHNP       Or   diphenhydrAMINE (BENADRYL) injection 50 mg  50 mg Intramuscular TID PRN Motley-Mangrum, Jadeka A, PMHNP       FLUoxetine (PROZAC) capsule 10 mg  10 mg Oral Daily Motley-Mangrum, Jadeka A, PMHNP   10 mg at 12/25/22 0913   haloperidol (HALDOL) tablet 5 mg  5 mg Oral TID PRN Motley-Mangrum, Jadeka A, PMHNP       Or   haloperidol lactate (HALDOL) injection 5 mg  5 mg Intramuscular TID PRN Motley-Mangrum, Jadeka A, PMHNP       hydrOXYzine (ATARAX) tablet 25 mg  25 mg Oral TID PRN Motley-Mangrum, Jadeka A, PMHNP       LORazepam (ATIVAN) tablet 2 mg  2 mg Oral TID PRN Motley-Mangrum, Jadeka A, PMHNP       Or   LORazepam (ATIVAN) injection 2 mg  2 mg Intramuscular TID PRN Motley-Mangrum, Jadeka A, PMHNP       magnesium hydroxide (MILK OF MAGNESIA) suspension 30 mL  30 mL Oral Daily PRN Motley-Mangrum, Jadeka A, PMHNP       traZODone (DESYREL) tablet 50 mg  50 mg Oral QHS PRN Motley-Mangrum, Jadeka A, PMHNP       PTA Medications: Medications Prior to Admission  Medication Sig  Dispense Refill Last Dose   cyclobenzaprine (FLEXERIL) 5 MG tablet Take 1 tablet (5 mg total) by mouth 3 (three) times daily as needed for muscle spasms. (Patient not taking: Reported on 12/24/2022) 30 tablet 0     Patient Stressors: Other: none    Patient Strengths: Average or above average intelligence  Physical Health  Supportive family/friends   Treatment Modalities: Medication Management, Group therapy, Case management,  1 to 1 session with clinician, Psychoeducation, Recreational therapy.   Physician Treatment Plan for Primary Diagnosis: Schizoaffective disorder, bipolar type (HCC) Long Term Goal(s): Improvement in symptoms so as ready for discharge   Short Term Goals: Ability to identify changes in lifestyle to reduce recurrence of condition will improve Ability to verbalize feelings will improve Ability to disclose and discuss suicidal ideas Ability to demonstrate self-control will improve Ability to identify and develop effective coping behaviors will improve Ability to maintain clinical measurements within normal limits will improve Compliance with prescribed medications will improve Ability to identify triggers associated with substance abuse/mental health issues will improve  Medication Management: Evaluate patient's response, side effects, and tolerance of medication regimen.  Therapeutic Interventions: 1 to 1 sessions, Unit Group sessions and Medication administration.  Evaluation of Outcomes: Progressing  Physician Treatment Plan for Secondary Diagnosis: Principal Problem:   Schizoaffective disorder, bipolar type (  HCC) Active Problems:   G6PD deficiency anemia (HCC)   GAD (generalized anxiety disorder)  Long Term Goal(s): Improvement in symptoms so as ready for discharge   Short Term Goals: Ability to identify changes in lifestyle to reduce recurrence of condition will improve Ability to verbalize feelings will improve Ability to disclose and discuss suicidal  ideas Ability to demonstrate self-control will improve Ability to identify and develop effective coping behaviors will improve Ability to maintain clinical measurements within normal limits will improve Compliance with prescribed medications will improve Ability to identify triggers associated with substance abuse/mental health issues will improve     Medication Management: Evaluate patient's response, side effects, and tolerance of medication regimen.  Therapeutic Interventions: 1 to 1 sessions, Unit Group sessions and Medication administration.  Evaluation of Outcomes: Progressing   RN Treatment Plan for Primary Diagnosis: Schizoaffective disorder, bipolar type (HCC) Long Term Goal(s): Knowledge of disease and therapeutic regimen to maintain health will improve  Short Term Goals: Ability to remain free from injury will improve, Ability to verbalize frustration and anger appropriately will improve, Ability to demonstrate self-control, Ability to participate in decision making will improve, Ability to verbalize feelings will improve, Ability to disclose and discuss suicidal ideas, Ability to identify and develop effective coping behaviors will improve, and Compliance with prescribed medications will improve  Medication Management: RN will administer medications as ordered by provider, will assess and evaluate patient's response and provide education to patient for prescribed medication. RN will report any adverse and/or side effects to prescribing provider.  Therapeutic Interventions: 1 on 1 counseling sessions, Psychoeducation, Medication administration, Evaluate responses to treatment, Monitor vital signs and CBGs as ordered, Perform/monitor CIWA, COWS, AIMS and Fall Risk screenings as ordered, Perform wound care treatments as ordered.  Evaluation of Outcomes: Progressing   LCSW Treatment Plan for Primary Diagnosis: Schizoaffective disorder, bipolar type (HCC) Long Term Goal(s): Safe  transition to appropriate next level of care at discharge, Engage patient in therapeutic group addressing interpersonal concerns.  Short Term Goals: Engage patient in aftercare planning with referrals and resources, Increase social support, Increase ability to appropriately verbalize feelings, Increase emotional regulation, Facilitate acceptance of mental health diagnosis and concerns, Facilitate patient progression through stages of change regarding substance use diagnoses and concerns, Identify triggers associated with mental health/substance abuse issues, and Increase skills for wellness and recovery  Therapeutic Interventions: Assess for all discharge needs, 1 to 1 time with Social worker, Explore available resources and support systems, Assess for adequacy in community support network, Educate family and significant other(s) on suicide prevention, Complete Psychosocial Assessment, Interpersonal group therapy.  Evaluation of Outcomes: Progressing   Progress in Treatment: Attending groups: Yes. Participating in groups: Yes. Taking medication as prescribed: Yes. Toleration medication: Yes. Family/Significant other contact made: No, will contact:  CSW will identify Patient understands diagnosis: Yes. Discussing patient identified problems/goals with staff: Yes. Medical problems stabilized or resolved: Yes. Denies suicidal/homicidal ideation: Yes. Issues/concerns per patient self-inventory: No.  New problem(s) identified: No, Describe:  none reported   New Short Term/Long Term Goal(s):  medication stabilization, elimination of SI thoughts, development of comprehensive mental wellness plan.    Patient Goals:  Pt states, "I want to sleep at night"  Discharge Plan or Barriers:  Patient recently admitted. CSW will continue to follow and assess for appropriate referrals and possible discharge planning.    Reason for Continuation of Hospitalization:  Anxiety Depression Hallucinations Medication stabilization  Estimated Length of Stay: 5-7 days  Last 3 [place] Suicide Severity Risk  Score: Flowsheet Row Ad[facility] (Current[facility] Most recent reading at 12/24/2022 10:55 PM ED from 12/24/2022 in [facility] Emergency Department at [facility] Most recent reading at 12/24/2022  2:25 PM ED from 09/14/2022 in [facility] at [facility] Most recent reading at 09/14/2022 12:22 PM  C-SSRS RISK CATEGORY No Risk No Risk No Risk       Last PHQ 2/9 Scores:    12/28/2020    4:42 AM 04/10/2018   11:13 AM 02/18/2018    9:12 AM  Depression screen PHQ 2/9  Decreased Interest 2 3 3   Down, Depressed, Hopeless 2 2 1   PHQ - 2 Score 4 5 4   Altered sleeping 1 3 1   Tired, decreased energy 3 3 2   Change in appetite 1 2 2   Feeling bad or failure about yourself  2 2 1   Trouble concentrating 2 2 0  Moving slowly or fidgety/restless 0 0 0  Suicidal thoughts 2 0 0  PHQ-9 Score 15 17 10   Difficult doing work/chores Very difficult      Scribe for Treatment Team: Beatris Si, LCSW 12/25/2022 2:55 PM

## 2022-12-25 NOTE — Group Note (Signed)
LCSW Group Therapy Note  Group Date: 12/25/2022 Start Time: 1300 End Time: 1400   Type of Therapy and Topic:  Group Therapy - How To Cope with Nervousness about Discharge   Participation Level:  Did Not Attend   Description of Group This process group involved identification of patients' feelings about discharge. Some of them are scheduled to be discharged soon, while others are new admissions, but each of them was asked to share thoughts and feelings surrounding discharge from the hospital. One common theme was that they are excited at the prospect of going home, while another was that many of them are apprehensive about sharing why they were hospitalized. Patients were given the opportunity to discuss these feelings with their peers in preparation for discharge.  Therapeutic Goals  Patient will identify their overall feelings about pending discharge. Patient will think about how they might proactively address issues that they believe will once again arise once they get home (i.e. with parents). Patients will participate in discussion about having hope for change.   Summary of Patient Progress:  Did not attend   Therapeutic Modalities Cognitive Behavioral Therapy   Yuritza Paulhus E Lydiana Milley, LCSW 12/25/2022  1:53 PM   

## 2022-12-25 NOTE — Progress Notes (Signed)
   12/25/22 2035  Psych Admission Type (Psych Patients Only)  Admission Status Involuntary  Psychosocial Assessment  Patient Complaints None  Eye Contact Fair  Facial Expression Flat  Affect Flat  Speech Logical/coherent  Interaction Assertive  Motor Activity Slow  Appearance/Hygiene In scrubs  Behavior Characteristics Cooperative  Mood Depressed  Thought Process  Coherency WDL  Content WDL  Delusions None reported or observed  Perception WDL  Hallucination None reported or observed  Judgment Limited  Confusion None  Danger to Self  Current suicidal ideation? Denies  Agreement Not to Harm Self Yes  Danger to Others  Danger to Others None reported or observed   Progress note   D: Pt seen in the hallway. Pt denies SI, HI, AVH. Pt always turns head away from this writer when asked about hallucinations. Pt answers questions appropriately and is oriented. Pt does not appear to be responding to internal stimuli.  Pt rates pain  0/10. Pt rates anxiety  0/10 and depression  0/10. Pt did come out of her room for evening group. Pt mostly stays in her room. Attends meals and takes medications by mouth without incident. Pt acknowledges that she did speak to a provider today but did not ask about discharge. No other concerns noted at this time.  A: Pt provided support and encouragement. Pt given scheduled medication as prescribed. PRNs as appropriate. Q15 min checks for safety.   R: Pt safe on the unit. Will continue to monitor.

## 2022-12-25 NOTE — Progress Notes (Addendum)
Patient appears flat with minimal contact and socialization. Patient remains mostly in her room and sleeping throughout day. Did not attend group. Patient has been med compliant and denies SI,HI, and A/V/H with no plan or intent. Patient has been cooperative and denies any pain or discomfort.
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
## 2022-12-25 NOTE — Plan of Care (Signed)
  Problem: Health Behavior/Discharge Planning: Goal: Compliance with treatment plan for underlying cause of condition will improve Outcome: Progressing   Problem: Safety: Goal: Periods of time without injury will increase Outcome: Progressing   

## 2022-12-26 DIAGNOSIS — F250 Schizoaffective disorder, bipolar type: Secondary | ICD-10-CM | POA: Diagnosis not present

## 2022-12-26 MED ORDER — ARIPIPRAZOLE 5 MG PO TABS
5.0000 mg | ORAL_TABLET | Freq: Every day | ORAL | Status: AC
  Administered 2022-12-26: 5 mg via ORAL
  Filled 2022-12-26: qty 1

## 2022-12-26 MED ORDER — TRAZODONE HCL 100 MG PO TABS
100.0000 mg | ORAL_TABLET | Freq: Every day | ORAL | Status: DC
  Administered 2022-12-26 – 2022-12-28 (×3): 100 mg via ORAL
  Filled 2022-12-26 (×4): qty 1

## 2022-12-26 MED ORDER — FLUOXETINE HCL 20 MG PO CAPS
20.0000 mg | ORAL_CAPSULE | Freq: Every day | ORAL | Status: DC
  Administered 2022-12-27 – 2022-12-29 (×3): 20 mg via ORAL
  Filled 2022-12-26 (×5): qty 1

## 2022-12-26 MED ORDER — ARIPIPRAZOLE 10 MG PO TABS
10.0000 mg | ORAL_TABLET | Freq: Every day | ORAL | Status: DC
  Administered 2022-12-27: 10 mg via ORAL
  Filled 2022-12-26 (×3): qty 1

## 2022-12-26 NOTE — Group Note (Signed)
Recreation Therapy Group Note   Group Topic:Animal Assisted Therapy   Group Date: 12/26/2022 Start Time: 0945 End Time: 1030 Facilitators: Lieutenant Abarca, Benito Mccreedy, LRT Location: [address] Dayroom  Animal-Assisted Activity (AAA) Program Checklist/Progress Note Patient Eligibility Criteria Checklist & Daily Group note for Rec Tx Intervention   AAA/T Program Assumption of Risk Form signed by Patient/ or Parent Legal Guardian YES  Patient is free of allergies or severe asthma  YES  Patient reports no fear of animals YES  Patient reports no history of cruelty to animals YES  Patient understands their participation is voluntary YES  Patient washes hands before animal contact YES  Patient washes hands after animal contact YES   Group Description: Patients provided opportunity to interact with trained and credentialed [employer] Therapy dog and the community volunteer/dog handler. Patients practiced appropriate animal interaction and were educated on dog safety outside of the hospital in common community settings. Patients were allowed to use dog toys and other items to practice commands, engage the dog in play, and/or complete routine aspects of animal care.   Education: Charity fundraiser, Health visitor, Communication & Social Skills   Affect/Mood: Congruent and Euthymic   Participation Level: Engaged   Participation Quality: Independent   Behavior: Appropriate, Attentive , and Cooperative   Speech/Thought Process: Directed, Focused, and Relevant   Modes of Intervention: Activity, Teaching laboratory technician, and Socialization   Patient Response to Interventions:  Receptive   Education Outcome:  Acknowledges education   Clinical Observations/Individualized Feedback: Sandra Macdonald was reserved and appropriately pet the visiting therapy dog, Sandra Macdonald during AAA programming. When asked, pt expressed that they have a 65 year old rescue dog named Midnight as a pet at home. Pt was interactive  with Airline pilot, when in close proximity. On-looking toward alternate group members with no spontaneous participation in open conversation.    Benito Mccreedy Baileigh Modisette, LRT, CTRS 12/26/2022 12:59 PM

## 2022-12-26 NOTE — Progress Notes (Signed)
Patient stated she slept better last night after sleep medication was provided.Patient denies SI,HI, and A/V/H at this moment with no plan or intent. Patient denies any pain/discomfort. Patient attended pet therapy today and came out to cafeteria for every meal. Patient observed with minimal interaction but remains cooperative on unit.
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
## 2022-12-26 NOTE — [facility] Group Notes (Signed)
Pt attended wrap up group 

## 2022-12-26 NOTE — Progress Notes (Signed)
   12/26/22 1955  Psych Admission Type (Psych Patients Only)  Admission Status Voluntary (signed Vol status 12/26/22 @ 1930)  Psychosocial Assessment  Patient Complaints None  Eye Contact Fair  Facial Expression Flat  Affect Flat  Speech Logical/coherent  Interaction Assertive;Minimal  Motor Activity Slow  Appearance/Hygiene In scrubs  Behavior Characteristics Cooperative;Appropriate to situation  Mood Sad  Thought Process  Coherency WDL  Content WDL  Delusions None reported or observed  Perception WDL  Hallucination None reported or observed  Judgment Limited  Confusion None  Danger to Self  Current suicidal ideation? Denies  Danger to Others  Danger to Others None reported or observed   Progress note   D: Pt seen in her room. Pt denies SI, HI, AVH. Pt rates pain  0/10. Pt rates anxiety  0/10 and depression  0/10. Pt denies lightheadedness and dizziness. Both were present when she first got up this morning for vital sign assessment. Pt encouraged to increase fluid intake. Discussed changes to sleep medication. Pt agreeable. Cooperative. No behavioral issues. Eating well and attended groups today. No other concerns noted at this time.  A: Pt provided support and encouragement. Pt given scheduled medication as prescribed. PRNs as appropriate. Q15 min checks for safety.   R: Pt safe on the unit. Will continue to monitor.

## 2022-12-26 NOTE — Plan of Care (Signed)
  Problem: Health Behavior/Discharge Planning: Goal: Compliance with treatment plan for underlying cause of condition will improve Outcome: Progressing   Problem: Safety: Goal: Periods of time without injury will increase Outcome: Progressing   Problem: Role Relationship: Goal: Ability to interact with others will improve Outcome: Progressing

## 2022-12-26 NOTE — Group Note (Signed)
Date:  12/26/2022 Time:  10:38 AM  Group Topic/Focus:  Goals Group:   The focus of this group is to help patients establish daily goals to achieve during treatment and discuss how the patient can incorporate goal setting into their daily lives to aide in recovery. Orientation:   The focus of this group is to educate the patient on the purpose and policies of crisis stabilization and provide a format to answer questions about their admission.  The group details unit policies and expectations of patients while admitted.    Participation Level:  Did Not Attend    Sloane Junkin W Satonya Lux 12/26/2022, 10:38 AM  

## 2022-12-26 NOTE — Progress Notes (Signed)
[facility] MD Progress Note  12/26/2022 12:33 PM Sandra Macdonald  MRN:  [id number]  Subjective:   Patient is a 27 year old female with a reported psychiatric history of psychosis, depression and anxiety who was admitted to the psychiatric hospital for worsening auditory hallucinations, visual hallucinations, paranoia, and suicidal thoughts.  Yesterday the psychiatry team made the following recommendations: -Stop Zyprexa 2.5 mg nightly that was started by admitting provider.  Patient is  overweight, and we will start a more weight neutral mood stabilizing antipsychotic medication -Start Abilify 5 mg once daily for schizoaffective disorder bipolar type, current episode is depressed.  Will likely need to increase the dose of this medication -Continue to fluoxetine 10 mg once daily, that was started by the admitting provider -Continue trazodone 50 mg nightly as needed and hydroxyzine 25 mg 3 times daily as needed   On my exam today, the patient reports auditory hallucinations continue but are less negative.  She reports having auditory hallucinations last night telling her "do not take her medication".  Reports that mood is still depressed.  Reports anxiety level still elevated.  Patient reports that paranoia continues.  She reports that sleep onset was difficult last night, but when she was able to fall asleep she was able to stay asleep.  Reports appetite is okay.  Denies any SI or HI today.  Denies any VH.  She is agreeable to increasing the dose of the Abilify for psychosis and mood, and increasing the Prozac for depression.  She denies any side effects to starting the Abilify.    Principal Problem: Schizoaffective disorder, bipolar type (HCC) Diagnosis: Principal Problem:   Schizoaffective disorder, bipolar type (HCC) Active Problems:   G6PD deficiency anemia (HCC)   GAD (generalized anxiety disorder)  Total Time spent with patient: 15 minutes  Past Psychiatric History:  Patient reports a history of  anxiety, depression, and psychosis diagnosed 2 years ago.  She explicitly denies being diagnosed with schizophrenia or schizoaffective disorder in the past Patient reports being hospitalized psychiatrically 3 times in the past.  Denies any history of suicide attempts.  Patient reports not currently taking any psychiatric medication for about 1 year, does not know what she was taking previously.  Past psychiatric medication history: Patient reports she was only taking medications, that she was prescribed from the most recent hospitalization about 2 years ago, and no other medication trials.  Past Medical History:  Past Medical History:  Diagnosis Date   Depression    Fibroid    G6PD deficiency    Supervision of normal first pregnancy, antepartum 02/18/2018    Nursing Staff Provider Office Location  [facility] Dating  LMP/6 week [place]  Language   English  Anatomy US   Flu Vaccine  n/a Genetic Screen  NIPS: low risk, female  AFP:   TDaP vaccine    Hgb A1C or  GTT Early  Third trimester  Rhogam     LAB RESULTS  Feeding Plan Breast Blood Type A/Positive/-- (07/15 0942)  Contraception  Antibody Negative (07/15 0942) Circumcision  Rubella 3.00 (07/15 0942) Pediatricia    Past Surgical History:  Procedure Laterality Date   CHOLECYSTECTOMY N/A 05/04/2021   Procedure: LAPAROSCOPIC CHOLECYSTECTOMY;  Surgeon: Darnell Level, MD;  Location: [facility];  Service: General;  Laterality: N/A;   ERCP N/A 05/03/2021   Procedure: ENDOSCOPIC RETROGRADE CHOLANGIOPANCREATOGRAPHY (ERCP);  Surgeon: Willis Modena, MD;  Location: [facility];  Service: Endoscopy;  Laterality: N/A;   NO PAST SURGERIES     PANCREATIC STENT PLACEMENT  05/03/2021   Procedure: PANCREATIC STENT PLACEMENT;  Surgeon: Willis Modena, MD;  Location: [facility];  Service: Endoscopy;;   REMOVAL OF STONES  05/03/2021   Procedure: REMOVAL OF STONES;  Surgeon: Willis Modena, MD;  Location: [facility];  Service: Endoscopy;;   SPHINCTEROTOMY  05/03/2021    Procedure: SPHINCTEROTOMY;  Surgeon: Willis Modena, MD;  Location: [facility];  Service: Endoscopy;;   Family History:  Family History  Problem Relation Age of Onset   Mental illness Mother    ADD / ADHD Neg Hx    Anxiety disorder Neg Hx    Alcohol abuse Neg Hx    Arthritis Neg Hx    Asthma Neg Hx    Cancer Neg Hx    Birth defects Neg Hx    Depression Neg Hx    COPD Neg Hx    Diabetes Neg Hx    Drug abuse Neg Hx    Early death Neg Hx    Hearing loss Neg Hx    Heart disease Neg Hx    Hyperlipidemia Neg Hx    Hypertension Neg Hx    Intellectual disability Neg Hx    Kidney disease Neg Hx    Learning disabilities Neg Hx    Miscarriages / Stillbirths Neg Hx    Stroke Neg Hx    Obesity Neg Hx    Vision loss Neg Hx    Varicose Veins Neg Hx    Family Psychiatric  History: See H&P   Social History:  Social History   Substance and Sexual Activity  Alcohol Use Not Currently   Alcohol/week: 1.0 standard drink of alcohol   Types: 1 Standard drinks or equivalent per week     Social History   Substance and Sexual Activity  Drug Use Not Currently   Frequency: 14.0 times per week   Types: Marijuana   Comment: pt reports 1-2 blunts on work days, more on days off    Social History   Socioeconomic History   Marital status: Single    Spouse name: Not on file   Number of children: Not on file   Years of education: Not on file   Highest education level: 10th grade  Occupational History   Occupation: Disabled  Tobacco Use   Smoking status: Never   Smokeless tobacco: Never  Vaping Use   Vaping Use: Never used  Substance and Sexual Activity   Alcohol use: Not Currently    Alcohol/week: 1.0 standard drink of alcohol    Types: 1 Standard drinks or equivalent per week   Drug use: Not Currently    Frequency: 14.0 times per week    Types: Marijuana    Comment: pt reports 1-2 blunts on work days, more on days off   Sexual activity: Yes    Birth control/protection: None   Other Topics Concern   Not on file  Social History Narrative   Pt lives with mother in [place].  She has an upcoming appointment with [facility] (on June 2).     Social Determinants of Health   Financial Resource Strain: Low Risk  (09/16/2018)   Overall Financial Resource Strain (CARDIA)    Difficulty of Paying Living Expenses: Not very hard  Food Insecurity: No Food Insecurity (12/24/2022)   Hunger Vital Sign    Worried About Running Out of Food in the Last Year: Never true    Ran Out of Food in the Last Year: Never true  Transportation Needs: No Transportation Needs (12/24/2022)  PRAPARE - Administrator, Civil Service (Medical): No    Lack of Transportation (Non-Medical): No  Physical Activity: Inactive (09/16/2018)   Exercise Vital Sign    Days of Exercise per Week: 0 days    Minutes of Exercise per Session: 0 min  Stress: No Stress Concern Present (09/16/2018)   [employer] of Occupational Health - Occupational Stress Questionnaire    Feeling of Stress : Only a little  Social Connections: Moderately Isolated (09/16/2018)   Social Connection and Isolation Panel [NHANES]    Frequency of Communication with Friends and Family: Never    Frequency of Social Gatherings with Friends and Family: Never    Attends Religious Services: More than 4 times per year    Active Member of [employer] or Organizations: No    Attends Engineer, structural: Never    Marital Status: Never married   Additional Social History:                           Current Medications: Current Facility-Administered Medications  Medication Dose Route Frequency Provider Last Rate Last Admin   acetaminophen (TYLENOL) tablet 650 mg  650 mg Oral Q6H PRN Motley-Mangrum, Jadeka A, PMHNP       alum & mag hydroxide-simeth (MAALOX/MYLANTA) 200-200-20 MG/5ML suspension 30 mL  30 mL Oral Q4H PRN Motley-Mangrum, Jadeka A, PMHNP       [START ON 12/27/2022] ARIPiprazole  (ABILIFY) tablet 10 mg  10 mg Oral Daily Chantrell Apsey, MD       ARIPiprazole (ABILIFY) tablet 5 mg  5 mg Oral QHS Alecsander Hattabaugh, MD       diphenhydrAMINE (BENADRYL) capsule 50 mg  50 mg Oral TID PRN Motley-Mangrum, Jadeka A, PMHNP       Or   diphenhydrAMINE (BENADRYL) injection 50 mg  50 mg Intramuscular TID PRN Motley-Mangrum, Jadeka A, PMHNP       FLUoxetine (PROZAC) capsule 10 mg  10 mg Oral Daily Motley-Mangrum, Jadeka A, PMHNP   10 mg at 12/26/22 8119   haloperidol (HALDOL) tablet 5 mg  5 mg Oral TID PRN Motley-Mangrum, Jadeka A, PMHNP       Or   haloperidol lactate (HALDOL) injection 5 mg  5 mg Intramuscular TID PRN Motley-Mangrum, Geralynn Ochs A, PMHNP       hydrOXYzine (ATARAX) tablet 25 mg  25 mg Oral TID PRN Motley-Mangrum, Jadeka A, PMHNP   25 mg at 12/26/22 0149   LORazepam (ATIVAN) tablet 2 mg  2 mg Oral TID PRN Motley-Mangrum, Geralynn Ochs A, PMHNP       Or   LORazepam (ATIVAN) injection 2 mg  2 mg Intramuscular TID PRN Motley-Mangrum, Jadeka A, PMHNP       magnesium hydroxide (MILK OF MAGNESIA) suspension 30 mL  30 mL Oral Daily PRN Motley-Mangrum, Jadeka A, PMHNP       traZODone (DESYREL) tablet 100 mg  100 mg Oral QHS Eliana Lueth, MD       traZODone (DESYREL) tablet 50 mg  50 mg Oral QHS PRN Motley-Mangrum, Jadeka A, PMHNP   50 mg at 12/25/22 2358    Lab Results:  Results for orders placed or performed during the hospital encounter of 12/24/22 (from the past 48 hour(s))  Hemoglobin A1c     Status: Abnormal   Collection Time: 12/25/22  6:31 AM  Result Value Ref Range   Hgb A1c MFr Bld 4.4 (L) 4.8 - 5.6 %    Comment: (NOTE) Pre  diabetes:          5.7%-6.4%  Diabetes:              >6.4%  Glycemic control for   <7.0% adults with diabetes    Mean Plasma Glucose 79.58 mg/dL    Comment: Performed at [facility], [address] [place], [place] [address]  Lipid panel     Status: Abnormal   Collection Time: 12/25/22  6:31 AM  Result Value Ref Range    Cholesterol 136 0 - 200 mg/dL   Triglycerides 72 <098 mg/dL   HDL 40 (L) >11 mg/dL   Total CHOL/HDL Ratio 3.4 RATIO   VLDL 14 0 - 40 mg/dL   LDL Cholesterol 82 0 - 99 mg/dL    Comment:        Total Cholesterol/HDL:CHD Risk Coronary Heart Disease Risk Table                     Men   Women  1/2 Average Risk   3.4   3.3  Average Risk       5.0   4.4  2 X Average Risk   9.6   7.1  3 X Average Risk  23.4   11.0        Use the calculated Patient Ratio above and the CHD Risk Table to determine the patient's CHD Risk.        ATP III CLASSIFICATION (LDL):  <100     mg/dL   Optimal  914-782  mg/dL   Near or Above                    Optimal  130-159  mg/dL   Borderline  956-213  mg/dL   High  >086     mg/dL   Very High Performed at [facility], [address], [place], [place] [address]   TSH     Status: None   Collection Time: 12/25/22  6:31 AM  Result Value Ref Range   TSH 2.713 0.350 - 4.500 uIU/mL    Comment: Performed by a 3rd Generation assay with a functional sensitivity of <=0.01 uIU/mL. Performed at [facility], [address], [place], [place] [address]     Blood Alcohol level:  Lab Results  Component Value Date   [facility] <10 12/24/2022   ETH <10 12/28/2020    Metabolic Disorder Labs: Lab Results  Component Value Date   HGBA1C 4.4 (L) 12/25/2022   MPG 79.58 12/25/2022   MPG 100 12/28/2020   No results found for: "PROLACTIN" Lab Results  Component Value Date   CHOL 136 12/25/2022   TRIG 72 12/25/2022   HDL 40 (L) 12/25/2022   CHOLHDL 3.4 12/25/2022   VLDL 14 12/25/2022   LDLCALC 82 12/25/2022   LDLCALC 69 12/28/2020    Physical Findings: AIMS:  , ,  ,  ,    CIWA:    COWS:     Musculoskeletal: Strength & Muscle Tone: Lying in bed Gait & Station: Lying in bed Patient leans: Lying in bed  Psychiatric Specialty Exam:  Presentation  General Appearance:  Disheveled  Eye Contact: Fair  Speech: Slow  Speech  Volume: Decreased  Handedness: Right   Mood and Affect  Mood: Anxious; Dysphoric  Affect: Depressed   Thought Process  Thought Processes: Linear  Descriptions of Associations:Intact  Orientation:Full (Time, Place and Person)  Thought Content:Paranoid Ideation  History of Schizophrenia/Schizoaffective disorder:No  Duration of Psychotic Symptoms:Greater than six months  Hallucinations:Hallucinations: Auditory  Ideas of Reference:Paranoia  Suicidal Thoughts:Suicidal Thoughts: No  Homicidal Thoughts:Homicidal Thoughts: No   Sensorium  Memory: Recent Good; Remote Good; Immediate Good  Judgment: Impaired  Insight: Lacking   Executive Functions  Concentration: Fair  Attention Span: Fair  Recall: Good  Fund of Knowledge: Good  Language: Good   Psychomotor Activity  Psychomotor Activity: Psychomotor Activity: Normal   Assets  Assets: Communication Skills; Desire for Improvement; Housing; Social Support   Sleep  Sleep: Sleep: Fair    Physical Exam: Physical Exam Vitals reviewed.  Pulmonary:     Effort: Pulmonary effort is normal.  Neurological:     Mental Status: She is alert.    Review of Systems  Constitutional:  Negative for chills and fever.  Cardiovascular:  Negative for chest pain and palpitations.  Neurological:  Negative for dizziness, tingling, tremors and headaches.  Psychiatric/Behavioral:  Positive for depression and hallucinations. The patient is nervous/anxious.    Blood pressure 97/63, pulse (!) 116, temperature 98 F (36.7 C), temperature source Oral, resp. rate 18, height 5\' 5"  (1.651 m), weight 104.3 kg, last menstrual period 12/19/2022, SpO2 99 %. Body mass index is 38.27 kg/m.   Treatment Plan Summary: Daily contact with patient to assess and evaluate symptoms and progress in treatment and Medication management     ASSESSMENT:   Diagnoses / Active Problems: Schizoaffective disorder bipolar type,  current episode is depressed GAD History of cocaine abuse   PLAN: Safety and Monitoring:             -- rescind IVC.  Patient can sign in as a voluntary admission to inpatient psychiatric unit for safety, stabilization and treatment             -- Daily contact with patient to assess and evaluate symptoms and progress in treatment             -- Patient's case to be discussed in multi-disciplinary team meeting             -- Observation Level : q15 minute checks             -- Vital signs:  q12 hours             -- Precautions: suicide, elopement, and assault   2. Psychiatric Diagnoses and Treatment:                -Increase Abilify from 5 mg to 10 once daily for schizoaffective disorder bipolar type, current episode is depressed.  Will likely need to increase the dose of this medication -Increase fluoxetine from 10 mg to 20 mg once daily, that was started by the admitting provider -Start scheduled trazodone 100 mg nightly -Continue trazodone 50 mg nightly as needed and hydroxyzine 25 mg 3 times daily as needed  -Previously stopped Zyprexa on admission   --  The risks/benefits/side-effects/alternatives to this medication were discussed in detail with the patient and time was given for questions. The patient consents to medication trial.                -- Metabolic profile and EKG monitoring obtained while on an atypical antipsychotic (BMI: Lipid Panel: HbgA1c: QTc:)              -- Encouraged patient to participate in unit milieu and in scheduled group therapies              -- Short Term Goals: Ability to identify  changes in lifestyle to reduce recurrence of condition will improve, Ability to verbalize feelings will improve, Ability to disclose and discuss suicidal ideas, Ability to demonstrate self-control will improve, Ability to identify and develop effective coping behaviors will improve, Ability to maintain clinical measurements within normal limits will improve, Compliance with  prescribed medications will improve, and Ability to identify triggers associated with substance abuse/mental health issues will improve             -- Long Term Goals: Improvement in symptoms so as ready for discharge                3. Medical Issues Being Addressed:              Anemia, chronic, clear by ED for admission to this hospital   4. Discharge Planning:              -- Social work and case management to assist with discharge planning and identification of hospital follow-up needs prior to discharge             -- Estimated LOS: 5-7 days             -- Discharge Concerns: Need to establish a safety plan; Medication compliance and effectiveness             -- Discharge Goals: Return home with outpatient referrals for mental health follow-up including medication management/psychotherapy   Cristy Hilts, MD 12/26/2022, 12:33 PM  Total Time Spent in Direct Patient Care:  I personally spent 35 minutes on the unit in direct patient care. The direct patient care time included face-to-face time with the patient, reviewing the patient's chart, communicating with other professionals, and coordinating care. Greater than 50% of this time was spent in counseling or coordinating care with the patient regarding goals of hospitalization, psycho-education, and discharge planning needs.   Phineas Inches, MD Psychiatrist  e

## 2022-12-26 NOTE — [facility] Suicide Risk Assessment (Signed)
[facility] INPATIENT:  Family/Significant Other Suicide Prevention Education  Suicide Prevention Education:  Education Completed; Sandra Macdonald (mother) [phone],  (name of family member/significant other) has been identified by the patient as the family member/significant other with whom the patient will be residing, and identified as the person(s) who will aid the patient in the event of a mental health crisis (suicidal ideations/suicide attempt).  With written consent from the patient, the family member/significant other has been provided the following suicide prevention education, prior to the and/or following the discharge of the patient.  CSW spoke to patient mother who reports that patient stated she could no longer handle her anxiety and the voices and needed to get back on meds. Patient has been off of meds for 1+ years. No guns/weapons. No safety concerns.  Pt will be returning to stay with mother.   The suicide prevention education provided includes the following: Suicide risk factors Suicide prevention and interventions National Suicide Hotline telephone number [facility] assessment telephone number [facility] Emergency Assistance [facility] and/or Residential Mobile Crisis Unit telephone number  Request made of family/significant other to: Remove weapons (e.g., guns, rifles, knives), all items previously/currently identified as safety concern.   Remove drugs/medications (over-the-counter, prescriptions, illicit drugs), all items previously/currently identified as a safety concern.  The family member/significant other verbalizes understanding of the suicide prevention education information provided.  The family member/significant other agrees to remove the items of safety concern listed above.  Sandra Macdonald 12/26/2022, 3:00 PM

## 2022-12-27 DIAGNOSIS — F250 Schizoaffective disorder, bipolar type: Secondary | ICD-10-CM

## 2022-12-27 MED ORDER — ARIPIPRAZOLE 15 MG PO TABS
15.0000 mg | ORAL_TABLET | Freq: Every day | ORAL | Status: DC
  Administered 2022-12-28 – 2022-12-29 (×2): 15 mg via ORAL
  Filled 2022-12-27 (×5): qty 1

## 2022-12-27 NOTE — Plan of Care (Signed)
°  Problem: Education: °Goal: Emotional status will improve °Outcome: Progressing °Goal: Mental status will improve °Outcome: Progressing °Goal: Verbalization of understanding the information provided will improve °Outcome: Progressing °  °

## 2022-12-27 NOTE — Group Note (Signed)
Date:  12/27/2022 Time:  5:22 PM  Group Topic/Focus:  Goals Group:   The focus of this group is to help patients establish daily goals to achieve during treatment and discuss how the patient can incorporate goal setting into their daily lives to aide in recovery. Orientation:   The focus of this group is to educate the patient on the purpose and policies of crisis stabilization and provide a format to answer questions about their admission.  The group details unit policies and expectations of patients while admitted.    Participation Level:  Did Not Attend  Participation Quality:   n/a  Affect:   n/a  Cognitive:   n/a  Insight: None  Engagement in Group:   n/a  Modes of Intervention:   n/a  Additional Comments:   Pt did not attend.  Edmund Hilda Darol Cush 12/27/2022, 5:22 PM

## 2022-12-27 NOTE — Progress Notes (Signed)
Pt medication compliant this morning. Pt denies SI/HI/AVH to nurse this morning. Pt remains on room lockout for groups and meals to encourage programming. Q 15 minutes ongoing for safety.

## 2022-12-27 NOTE — Progress Notes (Signed)
   12/27/22 1950  Psych Admission Type (Psych Patients Only)  Admission Status Involuntary  Psychosocial Assessment  Patient Complaints Anxiety  Eye Contact Fair  Facial Expression Flat  Affect Blunted  Speech Logical/coherent  Interaction Assertive  Motor Activity Slow  Appearance/Hygiene In scrubs  Behavior Characteristics Cooperative;Anxious  Mood Anxious  Thought Process  Coherency WDL  Content WDL  Delusions None reported or observed  Perception WDL  Hallucination None reported or observed  Judgment Limited  Confusion None  Danger to Self  Current suicidal ideation? Denies  Danger to Others  Danger to Others None reported or observed

## 2022-12-27 NOTE — Progress Notes (Signed)
Handoff report received from rebecca RN

## 2022-12-27 NOTE — Progress Notes (Signed)
Adult Psychoeducational Group Note  Date:  12/27/2022 Time:  9:56 PM  Group Topic/Focus:  Wrap-Up Group:   The focus of this group is to help patients review their daily goal of treatment and discuss progress on daily workbooks.  Participation Level:  Active  Participation Quality:  Appropriate  Affect:  Appropriate  Cognitive:  Appropriate  Insight: Appropriate  Engagement in Group:  Engaged  Modes of Intervention:  Discussion  Additional Comments:  Pt attended and participated in NA meeting.  Shirlyn Savin Katrinka Blazing 12/27/2022, 9:56 PM

## 2022-12-27 NOTE — Progress Notes (Addendum)
[facility] MD Progress Note  12/27/2022 3:52 PM Sandra Macdonald  MRN:  [id number]  Subjective:   Patient is a 27 year old female with a reported psychiatric history of psychosis, depression and anxiety who was admitted to the psychiatric hospital for worsening auditory hallucinations, visual hallucinations, paranoia, and suicidal thoughts.   On my exam today, the patient reports auditory hallucinations have subsided since last evaluation.  She was reports being able to sleep better, without auditory hallucinations and also the medication adjustments.  Reports that mood is less depressed.  Reports feeling less anxious.  Reports that appetite is K.  She still continues to isolate to her own room.  She is agreeable to moving from the [address] which is the acute psychotic call, to the stepdown hall, we will monitor her ability to interact with others in the milieu with this.  Patient reports that paranoia continues but is some less since yesterday.  She is doses suspicious of others.  Denies any SI or HI.  Denies any VH.  She is agreeable to increasing the dose of Abilify for psychosis and mood.  Denies any side effects to current scheduled psychiatric medications.     Principal Problem: Schizoaffective disorder, bipolar type (HCC) Diagnosis: Principal Problem:   Schizoaffective disorder, bipolar type (HCC) Active Problems:   G6PD deficiency anemia (HCC)   GAD (generalized anxiety disorder)  Total Time spent with patient: 15 minutes  Past Psychiatric History:  Patient reports a history of anxiety, depression, and psychosis diagnosed 2 years ago.  She explicitly denies being diagnosed with schizophrenia or schizoaffective disorder in the past Patient reports being hospitalized psychiatrically 3 times in the past.  Denies any history of suicide attempts.  Patient reports not currently taking any psychiatric medication for about 1 year, does not know what she was taking previously.  Past psychiatric medication  history: Patient reports she was only taking medications, that she was prescribed from the most recent hospitalization about 2 years ago, and no other medication trials.  Past Medical History:  Past Medical History:  Diagnosis Date   Depression    Fibroid    G6PD deficiency    Supervision of normal first pregnancy, antepartum 02/18/2018    Nursing Staff Provider Office Location  [facility] Dating  LMP/6 week [place]  Language   English  Anatomy US   Flu Vaccine  n/a Genetic Screen  NIPS: low risk, female  AFP:   TDaP vaccine    Hgb A1C or  GTT Early  Third trimester  Rhogam     LAB RESULTS  Feeding Plan Breast Blood Type A/Positive/-- (07/15 0942)  Contraception  Antibody Negative (07/15 0942) Circumcision  Rubella 3.00 (07/15 0942) Pediatricia    Past Surgical History:  Procedure Laterality Date   CHOLECYSTECTOMY N/A 05/04/2021   Procedure: LAPAROSCOPIC CHOLECYSTECTOMY;  Surgeon: Darnell Level, MD;  Location: [facility];  Service: General;  Laterality: N/A;   ERCP N/A 05/03/2021   Procedure: ENDOSCOPIC RETROGRADE CHOLANGIOPANCREATOGRAPHY (ERCP);  Surgeon: Willis Modena, MD;  Location: [facility];  Service: Endoscopy;  Laterality: N/A;   NO PAST SURGERIES     PANCREATIC STENT PLACEMENT  05/03/2021   Procedure: PANCREATIC STENT PLACEMENT;  Surgeon: Willis Modena, MD;  Location: [facility];  Service: Endoscopy;;   REMOVAL OF STONES  05/03/2021   Procedure: REMOVAL OF STONES;  Surgeon: Willis Modena, MD;  Location: [facility];  Service: Endoscopy;;   SPHINCTEROTOMY  05/03/2021   Procedure: Dennison Mascot;  Surgeon: Willis Modena, MD;  Location: [facility];  Service: Endoscopy;;  Family History:  Family History  Problem Relation Age of Onset   Mental illness Mother    ADD / ADHD Neg Hx    Anxiety disorder Neg Hx    Alcohol abuse Neg Hx    Arthritis Neg Hx    Asthma Neg Hx    Cancer Neg Hx    Birth defects Neg Hx    Depression Neg Hx    COPD Neg Hx    Diabetes Neg Hx    Drug abuse Neg Hx     Early death Neg Hx    Hearing loss Neg Hx    Heart disease Neg Hx    Hyperlipidemia Neg Hx    Hypertension Neg Hx    Intellectual disability Neg Hx    Kidney disease Neg Hx    Learning disabilities Neg Hx    Miscarriages / Stillbirths Neg Hx    Stroke Neg Hx    Obesity Neg Hx    Vision loss Neg Hx    Varicose Veins Neg Hx    Family Psychiatric  History: See H&P   Social History:  Social History   Substance and Sexual Activity  Alcohol Use Not Currently   Alcohol/week: 1.0 standard drink of alcohol   Types: 1 Standard drinks or equivalent per week     Social History   Substance and Sexual Activity  Drug Use Not Currently   Frequency: 14.0 times per week   Types: Marijuana   Comment: pt reports 1-2 blunts on work days, more on days off    Social History   Socioeconomic History   Marital status: Single    Spouse name: Not on file   Number of children: Not on file   Years of education: Not on file   Highest education level: 10th grade  Occupational History   Occupation: Disabled  Tobacco Use   Smoking status: Never   Smokeless tobacco: Never  Vaping Use   Vaping Use: Never used  Substance and Sexual Activity   Alcohol use: Not Currently    Alcohol/week: 1.0 standard drink of alcohol    Types: 1 Standard drinks or equivalent per week   Drug use: Not Currently    Frequency: 14.0 times per week    Types: Marijuana    Comment: pt reports 1-2 blunts on work days, more on days off   Sexual activity: Yes    Birth control/protection: None  Other Topics Concern   Not on file  Social History Narrative   Pt lives with mother in [place].  She has an upcoming appointment with [facility] [place] (on June 2).     Social Determinants of Health   Financial Resource Strain: Low Risk  (09/16/2018)   Overall Financial Resource Strain (CARDIA)    Difficulty of Paying Living Expenses: Not very hard  Food Insecurity: No Food Insecurity (12/24/2022)   Hunger  Vital Sign    Worried About Running Out of Food in the Last Year: Never true    Ran Out of Food in the Last Year: Never true  Transportation Needs: No Transportation Needs (12/24/2022)   PRAPARE - Administrator, Civil Service (Medical): No    Lack of Transportation (Non-Medical): No  Physical Activity: Inactive (09/16/2018)   Exercise Vital Sign    Days of Exercise per Week: 0 days    Minutes of Exercise per Session: 0 min  Stress: No Stress Concern Present (09/16/2018)   [employer] of Occupational Health - Occupational  Stress Questionnaire    Feeling of Stress : Only a little  Social Connections: Moderately Isolated (09/16/2018)   Social Connection and Isolation Panel [NHANES]    Frequency of Communication with Friends and Family: Never    Frequency of Social Gatherings with Friends and Family: Never    Attends Religious Services: More than 4 times per year    Active Member of [employer] or Organizations: No    Attends Engineer, structural: Never    Marital Status: Never married   Additional Social History:                           Current Medications: Current Facility-Administered Medications  Medication Dose Route Frequency Provider Last Rate Last Admin   acetaminophen (TYLENOL) tablet 650 mg  650 mg Oral Q6H PRN Motley-Mangrum, Jadeka A, PMHNP       alum & mag hydroxide-simeth (MAALOX/MYLANTA) 200-200-20 MG/5ML suspension 30 mL  30 mL Oral Q4H PRN Motley-Mangrum, Jadeka A, PMHNP       [START ON 12/28/2022] ARIPiprazole (ABILIFY) tablet 15 mg  15 mg Oral Daily Nykolas Bacallao, MD       diphenhydrAMINE (BENADRYL) capsule 50 mg  50 mg Oral TID PRN Motley-Mangrum, Jadeka A, PMHNP       Or   diphenhydrAMINE (BENADRYL) injection 50 mg  50 mg Intramuscular TID PRN Motley-Mangrum, Jadeka A, PMHNP       FLUoxetine (PROZAC) capsule 20 mg  20 mg Oral Daily Burt Piatek, MD   20 mg at 12/27/22 0804   haloperidol (HALDOL) tablet 5 mg  5 mg Oral TID  PRN Motley-Mangrum, Jadeka A, PMHNP       Or   haloperidol lactate (HALDOL) injection 5 mg  5 mg Intramuscular TID PRN Motley-Mangrum, Geralynn Ochs A, PMHNP       hydrOXYzine (ATARAX) tablet 25 mg  25 mg Oral TID PRN Motley-Mangrum, Jadeka A, PMHNP   25 mg at 12/27/22 0804   LORazepam (ATIVAN) tablet 2 mg  2 mg Oral TID PRN Motley-Mangrum, Geralynn Ochs A, PMHNP       Or   LORazepam (ATIVAN) injection 2 mg  2 mg Intramuscular TID PRN Motley-Mangrum, Jadeka A, PMHNP       magnesium hydroxide (MILK OF MAGNESIA) suspension 30 mL  30 mL Oral Daily PRN Motley-Mangrum, Jadeka A, PMHNP       traZODone (DESYREL) tablet 100 mg  100 mg Oral QHS Jeni Duling, MD   100 mg at 12/26/22 2035   traZODone (DESYREL) tablet 50 mg  50 mg Oral QHS PRN Motley-Mangrum, Jadeka A, PMHNP   50 mg at 12/25/22 2358    Lab Results:  No results found for this or any previous visit (from the past 48 hour(s)).   Blood Alcohol level:  Lab Results  Component Value Date   ETH <10 12/24/2022   ETH <10 12/28/2020    Metabolic Disorder Labs: Lab Results  Component Value Date   HGBA1C 4.4 (L) 12/25/2022   MPG 79.58 12/25/2022   MPG 100 12/28/2020   No results found for: "PROLACTIN" Lab Results  Component Value Date   CHOL 136 12/25/2022   TRIG 72 12/25/2022   HDL 40 (L) 12/25/2022   CHOLHDL 3.4 12/25/2022   VLDL 14 12/25/2022   LDLCALC 82 12/25/2022   LDLCALC 69 12/28/2020    Physical Findings: AIMS:  , ,  ,  ,    CIWA:    COWS:     Musculoskeletal:  Strength & Muscle Tone: Lying in bed Gait & Station: Lying in bed Patient leans: Lying in bed  Psychiatric Specialty Exam:  Presentation  General Appearance:  Disheveled  Eye Contact: Fair  Speech: Slow  Speech Volume: Decreased  Handedness: Right   Mood and Affect  Mood: Anxious; Dysphoric  Affect: Depressed   Thought Process  Thought Processes: Linear  Descriptions of Associations:Intact  Orientation:Full (Time, Place and  Person)  Thought Content:Paranoid Ideation  History of Schizophrenia/Schizoaffective disorder:No  Duration of Psychotic Symptoms:Greater than six months  Hallucinations:Hallucinations: denies AH, VH.   Ideas of Reference:Paranoia  Suicidal Thoughts:Suicidal Thoughts: No  Homicidal Thoughts:Homicidal Thoughts: No   Sensorium  Memory: Recent Good; Remote Good; Immediate Good  Judgment: Impaired  Insight: Lacking   Executive Functions  Concentration: Fair  Attention Span: Fair  Recall: Good  Fund of Knowledge: Good  Language: Good   Psychomotor Activity  Psychomotor Activity: Psychomotor Activity: Normal   Assets  Assets: Communication Skills; Desire for Improvement; Housing; Social Support   Sleep  Sleep: Sleep: Fair    Physical Exam: Physical Exam Vitals reviewed.  Pulmonary:     Effort: Pulmonary effort is normal.  Neurological:     Mental Status: She is alert.    Review of Systems  Constitutional:  Negative for chills and fever.  Cardiovascular:  Negative for chest pain and palpitations.  Neurological:  Negative for dizziness, tingling, tremors and headaches.  Psychiatric/Behavioral:  Positive for depression and hallucinations. The patient is nervous/anxious.    Blood pressure (!) 123/53, pulse (!) 110, temperature 98.3 F (36.8 C), temperature source Oral, resp. rate 18, height 5\' 5"  (1.651 m), weight 104.3 kg, last menstrual period 12/19/2022, SpO2 100 %. Body mass index is 38.27 kg/m.   Treatment Plan Summary: Daily contact with patient to assess and evaluate symptoms and progress in treatment and Medication management     ASSESSMENT:   Diagnoses / Active Problems: Schizoaffective disorder bipolar type, current episode is depressed GAD History of cocaine abuse   PLAN: Safety and Monitoring:             -- rescind IVC.  Patient can sign in as a voluntary admission to inpatient psychiatric unit for safety, stabilization  and treatment             -- Daily contact with patient to assess and evaluate symptoms and progress in treatment             -- Patient's case to be discussed in multi-disciplinary team meeting             -- Observation Level : q15 minute checks             -- Vital signs:  q12 hours             -- Precautions: suicide, elopement, and assault   2. Psychiatric Diagnoses and Treatment:                -Increase Abilify from 10 mg to 15 mg once daily for schizoaffective disorder bipolar type, current episode is depressed.  Will likely need to increase the dose of this medication -Continue fluoxetine 20 mg once daily, that was started by the admitting provider -Continue scheduled trazodone 100 mg nightly -Continue trazodone 50 mg nightly as needed and hydroxyzine 25 mg 3 times daily as needed  -Previously stopped Zyprexa on admission   --  The risks/benefits/side-effects/alternatives to this medication were discussed in detail with the patient and time was given for  questions. The patient consents to medication trial.                -- Metabolic profile and EKG monitoring obtained while on an atypical antipsychotic (BMI: Lipid Panel: HbgA1c: QTc:)              -- Encouraged patient to participate in unit milieu and in scheduled group therapies              -- Short Term Goals: Ability to identify changes in lifestyle to reduce recurrence of condition will improve, Ability to verbalize feelings will improve, Ability to disclose and discuss suicidal ideas, Ability to demonstrate self-control will improve, Ability to identify and develop effective coping behaviors will improve, Ability to maintain clinical measurements within normal limits will improve, Compliance with prescribed medications will improve, and Ability to identify triggers associated with substance abuse/mental health issues will improve             -- Long Term Goals: Improvement in symptoms so as ready for discharge                 3. Medical Issues Being Addressed:              Anemia, chronic, clear by ED for admission to this hospital   4. Discharge Planning:              -- Social work and case management to assist with discharge planning and identification of hospital follow-up needs prior to discharge             -- Estimated LOS: 3-4 more days             -- Discharge Concerns: Need to establish a safety plan; Medication compliance and effectiveness             -- Discharge Goals: Return home with outpatient referrals for mental health follow-up including medication management/psychotherapy   Cristy Hilts, MD 12/27/2022, 3:52 PM  Total Time Spent in Direct Patient Care:  I personally spent 35 minutes on the unit in direct patient care. The direct patient care time included face-to-face time with the patient, reviewing the patient's chart, communicating with other professionals, and coordinating care. Greater than 50% of this time was spent in counseling or coordinating care with the patient regarding goals of hospitalization, psycho-education, and discharge planning needs.   Phineas Inches, MD Psychiatrist

## 2022-12-27 NOTE — Progress Notes (Signed)
   12/27/22 0600  15 Minute Checks  Location Bedroom  Visual Appearance Calm  Behavior Composed  Sleep (Behavioral Health Patients Only)  Calculate sleep? (Click Yes once per 24 hr at 0600 safety check) Yes  Documented sleep last 24 hours 10.75

## 2022-12-27 NOTE — [facility] Group Notes (Signed)
Spiritual care group on grief and loss facilitated by chaplain Katy Juliett Eastburn, [facility]   Group Goal:   Support / Education around grief and loss   Members engage in facilitated group support and psycho-social education.   Group Description:   Following introductions and group rules, group members engaged in facilitated group dialog and support around topic of loss, with particular support around experiences of loss in their lives. Group Identified types of loss (relationships / self / things) and identified patterns, circumstances, and changes that precipitate losses. Reflected on thoughts / feelings around loss, normalized grief responses, and recognized variety in grief experience. Group noted Worden's four tasks of grief in discussion.   Group drew on Adlerian / Rogerian, narrative, MI,   Patient Progress: Did not attend.  

## 2022-12-28 DIAGNOSIS — F250 Schizoaffective disorder, bipolar type: Secondary | ICD-10-CM | POA: Diagnosis not present

## 2022-12-28 NOTE — Progress Notes (Signed)
   12/28/22 2300  Psych Admission Type (Psych Patients Only)  Admission Status Involuntary  Psychosocial Assessment  Patient Complaints Anxiety  Eye Contact Fair  Facial Expression Flat  Affect Flat  Speech Logical/coherent  Interaction Minimal  Motor Activity Slow  Appearance/Hygiene In scrubs  Behavior Characteristics Cooperative;Calm;Anxious  Mood Anxious  Thought Process  Coherency WDL  Content WDL  Delusions None reported or observed  Perception WDL  Hallucination None reported or observed  Judgment Limited  Confusion None  Danger to Self  Current suicidal ideation? Denies  Agreement Not to Harm Self Yes  Description of Agreement verbal  Danger to Others  Danger to Others None reported or observed

## 2022-12-28 NOTE — Progress Notes (Signed)
   12/28/22 1404  Psych Admission Type (Psych Patients Only)  Admission Status Involuntary  Psychosocial Assessment  Patient Complaints Anxiety  Eye Contact Fair  Facial Expression Flat  Affect Flat  Speech Logical/coherent  Interaction Assertive  Motor Activity Slow  Appearance/Hygiene Unremarkable  Behavior Characteristics Cooperative;Calm  Mood Pleasant  Thought Process  Coherency WDL  Content WDL  Delusions None reported or observed  Perception WDL  Hallucination None reported or observed  Judgment Limited  Confusion None  Danger to Self  Current suicidal ideation? Denies  Agreement Not to Harm Self Yes  Description of Agreement verbal  Danger to Others  Danger to Others None reported or observed

## 2022-12-28 NOTE — Progress Notes (Signed)
[facility] MD Progress Note  12/28/2022 3:32 PM Sandra Macdonald  MRN:  [id number]  Reason For Admission:   Patient is a 27 year old female with a reported psychiatric history of psychosis, depression and anxiety who was admitted to the psychiatric hospital for worsening auditory hallucinations, visual hallucinations, paranoia, and suicidal thoughts.  24-hour chart review: Blood pressure is within normal limits, heart rates have had some periods of  intermittent elevations through entire admission. HR earlier earlier today morning was 113.  Patient is compliant with scheduled medications, no as needed medications given overnight.  No behavioral episodes documented or reported overnight.  Patient assessment note: Pt presents today with a euthymic mood, attention to personal hygiene and grooming is good, eye contact is good, speech is clear & coherent. Thought contents are organized and logical, and pt currently denies SI/HI/AVH or paranoia. There is no evidence of delusional thoughts.  Patient denies being in any physical pain, reports that she is tolerating medications well, denies medication related side effects, reports a good appetite, reports a good sleep quality.   Pt verbalizes readiness for discharge.  I will plan is to discharge patient tomorrow 12/29/2022 as long as discharge safety planning has been completed by social work, and appointments made for continuity of care on the outside of [facility]. We are continuing medications as listed below and plan is to discharge pt tomorrow.  Patient has been educated to follow up with her outpatient primary care provider regarding persistently elevated heart rates intermittently during this admission.  Principal Problem: Schizoaffective disorder, bipolar type (HCC) Diagnosis: Principal Problem:   Schizoaffective disorder, bipolar type (HCC) Active Problems:   G6PD deficiency anemia (HCC)   GAD (generalized anxiety disorder)  Total Time spent with patient: 15  minutes  Past Psychiatric History:  Patient reports a history of anxiety, depression, and psychosis diagnosed 2 years ago.  She explicitly denies being diagnosed with schizophrenia or schizoaffective disorder in the past Patient reports being hospitalized psychiatrically 3 times in the past.  Denies any history of suicide attempts.  Patient reports not currently taking any psychiatric medication for about 1 year, does not know what she was taking previously.  Past psychiatric medication history: Patient reports she was only taking medications, that she was prescribed from the most recent hospitalization about 2 years ago, and no other medication trials.  Past Medical History:  Past Medical History:  Diagnosis Date   Depression    Fibroid    G6PD deficiency    Supervision of normal first pregnancy, antepartum 02/18/2018    Nursing Staff Provider Office Location  [facility] Dating  LMP/6 week [place]  Language   English  Anatomy US   Flu Vaccine  n/a Genetic Screen  NIPS: low risk, female  AFP:   TDaP vaccine    Hgb A1C or  GTT Early  Third trimester  Rhogam     LAB RESULTS  Feeding Plan Breast Blood Type A/Positive/-- (07/15 0942)  Contraception  Antibody Negative (07/15 0942) Circumcision  Rubella 3.00 (07/15 0942) Pediatricia    Past Surgical History:  Procedure Laterality Date   CHOLECYSTECTOMY N/A 05/04/2021   Procedure: LAPAROSCOPIC CHOLECYSTECTOMY;  Surgeon: Darnell Level, MD;  Location: [facility] ORS;  Service: General;  Laterality: N/A;   ERCP N/A 05/03/2021   Procedure: ENDOSCOPIC RETROGRADE CHOLANGIOPANCREATOGRAPHY (ERCP);  Surgeon: Willis Modena, MD;  Location: [facility];  Service: Endoscopy;  Laterality: N/A;   NO PAST SURGERIES     PANCREATIC STENT PLACEMENT  05/03/2021   Procedure: PANCREATIC STENT PLACEMENT;  Surgeon: Willis Modena, MD;  Location: [facility];  Service: Endoscopy;;   REMOVAL OF STONES  05/03/2021   Procedure: REMOVAL OF STONES;  Surgeon: Willis Modena, MD;  Location: [facility]  ENDOSCOPY;  Service: Endoscopy;;   SPHINCTEROTOMY  05/03/2021   Procedure: SPHINCTEROTOMY;  Surgeon: Willis Modena, MD;  Location: [facility] ENDOSCOPY;  Service: Endoscopy;;   Family History:  Family History  Problem Relation Age of Onset   Mental illness Mother    ADD / ADHD Neg Hx    Anxiety disorder Neg Hx    Alcohol abuse Neg Hx    Arthritis Neg Hx    Asthma Neg Hx    Cancer Neg Hx    Birth defects Neg Hx    Depression Neg Hx    COPD Neg Hx    Diabetes Neg Hx    Drug abuse Neg Hx    Early death Neg Hx    Hearing loss Neg Hx    Heart disease Neg Hx    Hyperlipidemia Neg Hx    Hypertension Neg Hx    Intellectual disability Neg Hx    Kidney disease Neg Hx    Learning disabilities Neg Hx    Miscarriages / Stillbirths Neg Hx    Stroke Neg Hx    Obesity Neg Hx    Vision loss Neg Hx    Varicose Veins Neg Hx    Family Psychiatric  History: See H&P   Social History:  Social History   Substance and Sexual Activity  Alcohol Use Not Currently   Alcohol/week: 1.0 standard drink of alcohol   Types: 1 Standard drinks or equivalent per week     Social History   Substance and Sexual Activity  Drug Use Not Currently   Frequency: 14.0 times per week   Types: Marijuana   Comment: pt reports 1-2 blunts on work days, more on days off    Social History   Socioeconomic History   Marital status: Single    Spouse name: Not on file   Number of children: Not on file   Years of education: Not on file   Highest education level: 10th grade  Occupational History   Occupation: Disabled  Tobacco Use   Smoking status: Never   Smokeless tobacco: Never  Vaping Use   Vaping Use: Never used  Substance and Sexual Activity   Alcohol use: Not Currently    Alcohol/week: 1.0 standard drink of alcohol    Types: 1 Standard drinks or equivalent per week   Drug use: Not Currently    Frequency: 14.0 times per week    Types: Marijuana    Comment: pt reports 1-2 blunts on work days, more on days  off   Sexual activity: Yes    Birth control/protection: None  Other Topics Concern   Not on file  Social History Narrative   Pt lives with mother in [place].  She has an upcoming appointment with [facility] of the [place] (on June 2).     Social Determinants of Health   Financial Resource Strain: Low Risk  (09/16/2018)   Overall Financial Resource Strain (CARDIA)    Difficulty of Paying Living Expenses: Not very hard  Food Insecurity: No Food Insecurity (12/24/2022)   Hunger Vital Sign    Worried About Running Out of Food in the Last Year: Never true    Ran Out of Food in the Last Year: Never true  Transportation Needs: No Transportation Needs (12/24/2022)   PRAPARE - Transportation  Lack of Transportation (Medical): No    Lack of Transportation (Non-Medical): No  Physical Activity: Inactive (09/16/2018)   Exercise Vital Sign    Days of Exercise per Week: 0 days    Minutes of Exercise per Session: 0 min  Stress: No Stress Concern Present (09/16/2018)   [employer] of Occupational Health - Occupational Stress Questionnaire    Feeling of Stress : Only a little  Social Connections: Moderately Isolated (09/16/2018)   Social Connection and Isolation Panel [NHANES]    Frequency of Communication with Friends and Family: Never    Frequency of Social Gatherings with Friends and Family: Never    Attends Religious Services: More than 4 times per year    Active Member of [employer] or Organizations: No    Attends Engineer, structural: Never    Marital Status: Never married   Additional Social History:                           Current Medications: Current Facility-Administered Medications  Medication Dose Route Frequency Provider Last Rate Last Admin   acetaminophen (TYLENOL) tablet 650 mg  650 mg Oral Q6H PRN Motley-Mangrum, Jadeka A, PMHNP       alum & mag hydroxide-simeth (MAALOX/MYLANTA) 200-200-20 MG/5ML suspension 30 mL  30 mL Oral Q4H PRN  Motley-Mangrum, Jadeka A, PMHNP       ARIPiprazole (ABILIFY) tablet 15 mg  15 mg Oral Daily Massengill, Harrold Donath, MD   15 mg at 12/28/22 4098   diphenhydrAMINE (BENADRYL) capsule 50 mg  50 mg Oral TID PRN Motley-Mangrum, Geralynn Ochs A, PMHNP       Or   diphenhydrAMINE (BENADRYL) injection 50 mg  50 mg Intramuscular TID PRN Motley-Mangrum, Jadeka A, PMHNP       FLUoxetine (PROZAC) capsule 20 mg  20 mg Oral Daily Massengill, Nathan, MD   20 mg at 12/28/22 0810   haloperidol (HALDOL) tablet 5 mg  5 mg Oral TID PRN Motley-Mangrum, Jadeka A, PMHNP       Or   haloperidol lactate (HALDOL) injection 5 mg  5 mg Intramuscular TID PRN Motley-Mangrum, Geralynn Ochs A, PMHNP       hydrOXYzine (ATARAX) tablet 25 mg  25 mg Oral TID PRN Motley-Mangrum, Jadeka A, PMHNP   25 mg at 12/27/22 2119   LORazepam (ATIVAN) tablet 2 mg  2 mg Oral TID PRN Motley-Mangrum, Geralynn Ochs A, PMHNP       Or   LORazepam (ATIVAN) injection 2 mg  2 mg Intramuscular TID PRN Motley-Mangrum, Jadeka A, PMHNP       magnesium hydroxide (MILK OF MAGNESIA) suspension 30 mL  30 mL Oral Daily PRN Motley-Mangrum, Jadeka A, PMHNP       traZODone (DESYREL) tablet 100 mg  100 mg Oral QHS Massengill, Nathan, MD   100 mg at 12/27/22 2119   traZODone (DESYREL) tablet 50 mg  50 mg Oral QHS PRN Motley-Mangrum, Jadeka A, PMHNP   50 mg at 12/25/22 2358    Lab Results:  No results found for this or any previous visit (from the past 48 hour(s)).   Blood Alcohol level:  Lab Results  Component Value Date   [facility] <10 12/24/2022   ETH <10 12/28/2020    Metabolic Disorder Labs: Lab Results  Component Value Date   HGBA1C 4.4 (L) 12/25/2022   MPG 79.58 12/25/2022   MPG 100 12/28/2020   No results found for: "PROLACTIN" Lab Results  Component Value Date   CHOL  136 12/25/2022   TRIG 72 12/25/2022   HDL 40 (L) 12/25/2022   CHOLHDL 3.4 12/25/2022   VLDL 14 12/25/2022   LDLCALC 82 12/25/2022   LDLCALC 69 12/28/2020    Physical Findings: AIMS:  , ,  ,  ,     CIWA:    COWS:     Musculoskeletal: Strength & Muscle Tone: Lying in bed Gait & Station: Lying in bed Patient leans: Lying in bed  Psychiatric Specialty Exam:  Presentation  General Appearance:  Appropriate for Environment; Fairly Groomed  Eye Contact: Fair  Speech: Clear and Coherent  Speech Volume: Normal  Handedness: Right   Mood and Affect  Mood: Euthymic  Affect: Congruent; Appropriate   Thought Process  Thought Processes: Coherent  Descriptions of Associations:Intact  Orientation:Full (Time, Place and Person)  Thought Content:Logical  History of Schizophrenia/Schizoaffective disorder:Yes  Duration of Psychotic Symptoms:Greater than six months  Hallucinations:Hallucinations: denies AH, VH.   Ideas of Reference:None  Suicidal Thoughts:Suicidal Thoughts: No   Homicidal Thoughts:Homicidal Thoughts: No    Sensorium  Memory: Immediate Good  Judgment: Fair  Insight: Fair   Executive Functions  Concentration: Good  Attention Span: Good  Recall: Fair  Fund of Knowledge: Fair  Language: Good   Psychomotor Activity  Psychomotor Activity: Psychomotor Activity: Normal    Assets  Assets: Communication Skills; Resilience; Social Support   Sleep  Sleep: Sleep: Good     Physical Exam: Physical Exam Vitals reviewed.  Pulmonary:     Effort: Pulmonary effort is normal.  Neurological:     Mental Status: She is alert.    Review of Systems  Constitutional:  Negative for chills and fever.  Cardiovascular:  Negative for chest pain and palpitations.  Neurological:  Negative for dizziness, tingling, tremors and headaches.  Psychiatric/Behavioral:  Positive for depression and substance abuse. Negative for hallucinations, memory loss and suicidal ideas. The patient is nervous/anxious and has insomnia.    Blood pressure 129/80, pulse (!) 113, temperature 98.4 F (36.9 C), temperature source Oral, resp. rate 18, height  5\' 5"  (1.651 m), weight 104.3 kg, last menstrual period 12/19/2022, SpO2 100 %. Body mass index is 38.27 kg/m.   Treatment Plan Summary: Daily contact with patient to assess and evaluate symptoms and progress in treatment and Medication management     ASSESSMENT:   Diagnoses / Active Problems: Schizoaffective disorder bipolar type, current episode is depressed GAD History of cocaine abuse   PLAN: Safety and Monitoring:             -- rescind IVC.  Patient can sign in as a voluntary admission to inpatient psychiatric unit for safety, stabilization and treatment             -- Daily contact with patient to assess and evaluate symptoms and progress in treatment             -- Patient's case to be discussed in multi-disciplinary team meeting             -- Observation Level : q15 minute checks             -- Vital signs:  q12 hours             -- Precautions: suicide, elopement, and assault   2. Psychiatric Diagnoses and Treatment:                -Continue Abilify 15 mg once daily for schizoaffective disorder bipolar type, current episode is depressed.  Will likely need to  increase the dose of this medication -Continue fluoxetine 20 mg once daily, that was started by the admitting provider -Continue scheduled trazodone 100 mg nightly -Continue trazodone 50 mg nightly as needed and hydroxyzine 25 mg 3 times daily as needed  -Previously stopped Zyprexa on admission   --  The risks/benefits/side-effects/alternatives to this medication were discussed in detail with the patient and time was given for questions. The patient consents to medication trial.                -- Metabolic profile and EKG monitoring obtained while on an atypical antipsychotic (BMI: Lipid Panel: HbgA1c: QTc:)              -- Encouraged patient to participate in unit milieu and in scheduled group therapies              -- Short Term Goals: Ability to identify changes in lifestyle to reduce recurrence of condition  will improve, Ability to verbalize feelings will improve, Ability to disclose and discuss suicidal ideas, Ability to demonstrate self-control will improve, Ability to identify and develop effective coping behaviors will improve, Ability to maintain clinical measurements within normal limits will improve, Compliance with prescribed medications will improve, and Ability to identify triggers associated with substance abuse/mental health issues will improve             -- Long Term Goals: Improvement in symptoms so as ready for discharge                3. Medical Issues Being Addressed:              Anemia, chronic, clear by ED for admission to this hospital   4. Discharge Planning:              -- Social work and case management to assist with discharge planning and identification of hospital follow-up needs prior to discharge             -- Estimated LOS: 3-4 more days             -- Discharge Concerns: Need to establish a safety plan; Medication compliance and effectiveness             -- Discharge Goals: Return home with outpatient referrals for mental health follow-up including medication management/psychotherapy   Starleen Blue, NP 12/28/2022, 3:32 PM  Total Time Spent in Direct Patient Care:  I personally spent 35 minutes on the unit in direct patient care. The direct patient care time included face-to-face time with the patient, reviewing the patient's chart, communicating with other professionals, and coordinating care. Greater than 50% of this time was spent in counseling or coordinating care with the patient regarding goals of hospitalization, psycho-education, and discharge planning needs.   Phineas Inches, MD Psychiatrist  Patient ID: Kendyle Perin, female   DOB: Feb 23, 1996, 27 y.o.   MRN: [id number]

## 2022-12-28 NOTE — Progress Notes (Signed)
Adult Psychoeducational Group Note  Date:  12/28/2022 Time:  9:32 PM  Group Topic/Focus:  Wrap-Up Group:   The focus of this group is to help patients review their daily goal of treatment and discuss progress on daily workbooks.  Participation Level:  Active  Participation Quality:  Appropriate  Affect:  Appropriate  Cognitive:  Appropriate  Insight: Appropriate  Engagement in Group:  Engaged  Modes of Intervention:  Discussion  Additional Comments:  Pt stated she had a good day.  Pt stated her goal for the day was to not isolate herself.  Pt met goal.  Lucilla Lame 12/28/2022, 9:32 PM

## 2022-12-28 NOTE — Progress Notes (Signed)
   12/28/22 0545  15 Minute Checks  Location Bedroom  Visual Appearance Calm  Behavior Sleeping  Sleep (Behavioral Health Patients Only)  Calculate sleep? (Click Yes once per 24 hr at 0600 safety check) Yes  Documented sleep last 24 hours 8

## 2022-12-28 NOTE — Group Note (Signed)
Date:  12/28/2022 Time:  12:31 PM  Group Topic/Focus:  Emotional Education:   The focus of this group is to discuss what feelings/emotions are, and how they are experienced. Self Care:   The focus of this group is to help patients understand the importance of self-care in order to improve or restore emotional, physical, spiritual, interpersonal, and financial health.    Participation Level:  Did Not Attend  Participation Quality:    Affect:    Cognitive:    Insight:   Engagement in Group:    Modes of Intervention:   Additional Comments:    Memory Dance Tiersa Dayley 12/28/2022, 12:31 PM

## 2022-12-28 NOTE — Plan of Care (Signed)
  Problem: Coping: Goal: Ability to verbalize frustrations and anger appropriately will improve Outcome: Progressing   Problem: Coping: Goal: Ability to demonstrate self-control will improve Outcome: Progressing   Problem: Health Behavior/Discharge Planning: Goal: Compliance with treatment plan for underlying cause of condition will improve Outcome: Progressing   Problem: Safety: Goal: Periods of time without injury will increase Outcome: Progressing   Problem: Education: Goal: Knowledge of the prescribed therapeutic regimen will improve Outcome: Progressing   Problem: Role Relationship: Goal: Ability to interact with others will improve Outcome: Progressing

## 2022-12-29 DIAGNOSIS — F250 Schizoaffective disorder, bipolar type: Secondary | ICD-10-CM | POA: Diagnosis not present

## 2022-12-29 MED ORDER — HYDROXYZINE HCL 25 MG PO TABS: 25.0000 mg | ORAL_TABLET | Freq: Three times a day (TID) | ORAL | 0 refills | Status: AC | PRN

## 2022-12-29 MED ORDER — FLUOXETINE HCL 20 MG PO CAPS: 20.0000 mg | ORAL_CAPSULE | Freq: Every day | ORAL | 0 refills | Status: AC

## 2022-12-29 MED ORDER — ARIPIPRAZOLE 15 MG PO TABS: 15.0000 mg | ORAL_TABLET | Freq: Every day | ORAL | 0 refills | Status: AC

## 2022-12-29 MED ORDER — TRAZODONE HCL 100 MG PO TABS: 100.0000 mg | ORAL_TABLET | Freq: Every day | ORAL | 0 refills | Status: AC

## 2022-12-29 NOTE — Group Note (Signed)
Date:  12/29/2022 Time:  10:45 AM  Group Topic/Focus:  Orientation:   The focus of this group is to educate the patient on the purpose and policies of crisis stabilization and provide a format to answer questions about their admission.  The group details unit policies and expectations of patients while admitted.    Participation Level:  Active  Participation Quality:  Appropriate  Affect:  Appropriate  Cognitive:  Appropriate  Insight: Appropriate  Engagement in Group:  Lacking  Modes of Intervention:  Discussion  Additional Comments:     Reymundo Poll 12/29/2022, 10:45 AM

## 2022-12-29 NOTE — Progress Notes (Signed)
Pt discharged at his time. Pt removed all belongings, prescriptions, and verbalized understanding of medications and follow up care. Pt denies SI/HI/AVH. Pt left facility with family.

## 2022-12-29 NOTE — [facility] Suicide Risk Assessment (Signed)
Suicide Risk Assessment  Discharge Assessment    [facility] Discharge Suicide Risk Assessment   Principal Problem: Schizoaffective disorder, bipolar type [facility]) Discharge Diagnoses: Principal Problem:   Schizoaffective disorder, bipolar type (HCC) Active Problems:   G6PD deficiency anemia (HCC)   GAD (generalized anxiety disorder)  Sandra Macdonald was admitted for Schizoaffective disorder, bipolar type (HCC) and crisis management.  She was treated with the following medications see MAR.  Sandra Macdonald was discharged with current medication and was instructed on how to take medications as prescribed; (details listed below under Medication List).  Medical problems were identified and treated as needed.  Home medications were restarted as appropriate.  Improvement was monitored by observation and Sandra Macdonald daily report of symptom reduction.  Emotional and mental status was monitored by daily self-inventory reports completed by Sandra Macdonald and clinical staff.         Sandra Macdonald was evaluated by the treatment team for stability and plans for continued recovery upon discharge.  Sandra Macdonald motivation was an integral factor for scheduling further treatment.  Employment, transportation, bed availability, health status, family support, and any pending legal issues were also considered during her hospital stay.  She was offered further treatment options upon discharge including but not limited to Residential, Intensive Outpatient, and Outpatient treatment.  Sandra Macdonald will follow up with the services as listed below under Follow Up Information.     Upon completion of this admission the Sandra Macdonald was both mentally and medically stable for discharge denying suicidal/homicidal ideation, auditory/visual/tactile hallucinations, delusional thoughts and paranoia.      Total Time spent with patient: 30 minutes  Musculoskeletal: Strength & Muscle Tone: within normal limits Gait & Station: normal Patient leans: N/A  Psychiatric  Specialty Exam  Presentation  General Appearance:  Casual  Eye Contact: Fair  Speech: Clear and Coherent  Speech Volume: Normal  Handedness: Right   Mood and Affect  Mood: Euthymic  Duration of Depression Symptoms: No data recorded Affect: Congruent; Appropriate   Thought Process  Thought Processes: Coherent  Descriptions of Associations:Intact  Orientation:Full (Time, Place and Person)  Thought Content:Logical  History of Schizophrenia/Schizoaffective disorder:Yes  Duration of Psychotic Symptoms:Greater than six months  Hallucinations:Hallucinations: None  Ideas of Reference:None  Suicidal Thoughts:Suicidal Thoughts: No  Homicidal Thoughts:Homicidal Thoughts: No   Sensorium  Memory: Immediate Good; Recent Good  Judgment: Fair  Insight: Fair   Art therapist  Concentration: Fair  Attention Span: Fair  Recall: [employer] of Knowledge: Fair  Language: Fair   Psychomotor Activity  Psychomotor Activity: Psychomotor Activity: Normal   Assets  Assets: Communication Skills; Social Support; Resilience; Physical Health   Sleep  Sleep: Sleep: Good   Physical Exam: Physical Exam Vitals and nursing note reviewed.  Constitutional:      General: She is not in acute distress.    Appearance: She is obese. She is not ill-appearing.  HENT:     Head: Normocephalic.     Nose: Nose normal.     Mouth/Throat:     Mouth: Mucous membranes are moist.     Pharynx: Oropharynx is clear.  Abdominal:     Palpations: Abdomen is soft.  Neurological:     Mental Status: She is alert and oriented to person, place, and time.  Psychiatric:        Attention and Perception: Attention and perception normal.        Mood and Affect: Mood normal. Affect is flat.        Speech: Speech  normal.        Behavior: Behavior normal. Behavior is cooperative.        Thought Content: Thought content normal. Thought content is not paranoid or  delusional. Thought content does not include homicidal or suicidal ideation. Thought content does not include homicidal or suicidal plan.        Cognition and Memory: Cognition and memory normal.        Judgment: Judgment normal.    Review of Systems  Psychiatric/Behavioral: Negative.  Negative for depression, hallucinations, memory loss, substance abuse and suicidal ideas. The patient is not nervous/anxious and does not have insomnia.    Blood pressure 114/61, pulse (!) 128, temperature 98.1 F (36.7 C), temperature source Oral, resp. rate 18, height 5\' 5"  (1.651 m), weight 104.3 kg, last menstrual period 12/19/2022, SpO2 100 %. Body mass index is 38.27 kg/m.  Mental Status Per Nursing Assessment::   On Admission:  NA  Demographic Factors:  Adolescent or young adult and Low socioeconomic status  Loss Factors: NA  Historical Factors: Impulsivity  Risk Reduction Factors:   Sense of responsibility to family, Employed, Living with another person, especially a relative, Positive social support, and Positive therapeutic relationship  Continued Clinical Symptoms:  Bipolar Disorder:   Mixed State Schizophrenia:   Less than 55 years old More than one psychiatric diagnosis Previous Psychiatric Diagnoses and Treatments  Cognitive Features That Contribute To Risk:  None    Suicide Risk:  Mild:  Suicidal ideation of limited frequency, intensity, duration, and specificity.  There are no identifiable plans, no associated intent, mild dysphoria and related symptoms, good self-control (both objective and subjective assessment), few other risk factors, and identifiable protective factors, including available and accessible social support.   Follow-up Information     [facility]. Go to.   Specialty: Behavioral Health Why: You have a follow up appointment at this location at 9am on 01/05/2023. Please go to this provider for ongoing therapy and medication management  services. Contact information: [address] [place] [address] [phone]                Plan Of Care/Follow-up recommendations:  Activity: As tolerated  Diet: Heart healthy  Other: -Follow-up with your outpatient psychiatric provider -instructions on appointment date, time, and address (location) are provided to you in discharge paperwork.   -Take your psychiatric medications as prescribed at discharge - instructions are provided to you in the discharge paperwork.    -Follow-up with outpatient primary care doctor and other specialists -for management of preventative medicine and chronic medical disease, including:   -Testing: Follow-up with outpatient provider for abnormal lab results:  none   -Recommend abstinence from alcohol, tobacco, and other illicit drug use at discharge.    -If your psychiatric symptoms recur, worsen, or if you have side effects to your psychiatric medications, call your outpatient psychiatric provider, 911, 988 or go to the nearest emergency department.   -If suicidal thoughts recur, call your outpatient psychiatric provider, 911, 988 or go to the nearest emergency department.  Loletta Parish, NP 12/29/2022, 11:19 AM

## 2022-12-29 NOTE — Progress Notes (Signed)
  [facility] Adult Case Management Discharge Plan :  Will you be returning to the same living situation after discharge:  Yes,  back to stay with mother  At discharge, do you have transportation home?: Yes,  boyfriend and mother will pick patient up Do you have the ability to pay for your medications: Yes,  yes   Release of information consent forms completed and in the chart;  Patient's signature needed at discharge.  Patient to Follow up at:  Follow-up Information     [facility]. Go to.   Specialty: Behavioral Health Why: You have a follow up appointment at this location at 9am on 01/05/2023. Please go to this provider for ongoing therapy and medication management services. Contact information: [address] [place] [address] [phone]                Next level of care provider has access to [facility] Link:yes  Safety Planning and Suicide Prevention discussed: Yes,  mother     Has patient been referred to the Quitline?: Patient refused referral for treatment  Patient has been referred for addiction treatment: No known substance use disorder.  Daisy Mcneel E Janaysia Mcleroy, LCSW 12/29/2022, 10:07 AM

## 2022-12-29 NOTE — Discharge Summary (Signed)
Physician Discharge Summary Note  Patient:  Sandra Macdonald is an 27 y.o., female MRN:  [id number] DOB:  [birth year] Patient phone:  [phone] (home)  Patient address:   [address]  [place] [address],  Total Time spent with patient: 45 minutes  Date of Admission:  12/24/2022 Date of Discharge: 12/29/2022  Reason for Admission:   HPI: Sandra Macdonald is a 27 year old female with past psychiatric history of schizoaffective disorder bipolar type, GAD who presented to [facility] 12/24/22 for auditory hallucination and paranoia x1 month in the context of medication after stopping medications x1 year.   24 hour chart review: Staff report patient has remained consistently visible in milieu attending unit groups and activities. Medication compliant; No PRNs given or side effects noted. Slept throughout the night without any issues. Staff deny any observed paranoia or delusions.   Assessment: Patient presents casually with appropriate detail to ADLs. Stable eye contact. Calm and cooperative. Euthymic mood, congruent affect. She reports improved mood since admission 'since being back on my medications'. States plan to return back to her home with her mother and job at [employer]. She denies any SI/HI/AVH. No delusional thought content or evidence of responding to external/internal stimuli noted. She verbalizes an understanding of her current medication regimen and contracts for safety. She provided verbal permission to speak to her mother for collateral information and safety planning.   Per chart review patient is scheduled for discharge today home today with mother transporting. She will continue on her current regimen Abilify 15 mg PO daily for schizoaffective disorder and mood stabilization, Prozac 20 mg PO dailyto augment Abilify for further mood stabilization, Trazodone 100 mg PO daily HS for insomnia; she will have PRN Hydroxyzine 25 mg for any breakthrough anxiety symptoms. She is contracting for  safety and denies any safety concerns at this time.   Collateral: [name] (mother) [phone]  79 Mother reports patient is at baseline and plan is for patient to return home with her. Denies any safety concerns at this time and verbalized an understanding of patient's current medication regimen. Currently in lobby waiting for patient to transport home.    Principal Problem: Schizoaffective disorder, bipolar type [facility]) Discharge Diagnoses: Principal Problem:   Schizoaffective disorder, bipolar type (HCC) Active Problems:   G6PD deficiency anemia (HCC)   GAD (generalized anxiety disorder)   Past Psychiatric History: schizoaffective disorder, bipolar type; GAD  Past Medical History:  Past Medical History:  Diagnosis Date   Depression    Fibroid    G6PD deficiency    Supervision of normal first pregnancy, antepartum 02/18/2018    Nursing Staff Provider Office Location  [facility] Dating  LMP/6 week [place]  Language   English  Anatomy US   Flu Vaccine  n/a Genetic Screen  NIPS: low risk, female  AFP:   TDaP vaccine    Hgb A1C or  GTT Early  Third trimester  Rhogam     LAB RESULTS  Feeding Plan Breast Blood Type A/Positive/-- (07/15 0942)  Contraception  Antibody Negative (07/15 0942) Circumcision  Rubella 3.00 (07/15 0942) Pediatricia    Past Surgical History:  Procedure Laterality Date   CHOLECYSTECTOMY N/A 05/04/2021   Procedure: LAPAROSCOPIC CHOLECYSTECTOMY;  Surgeon: Darnell Level, MD;  Location: [facility] ORS;  Service: General;  Laterality: N/A;   ERCP N/A 05/03/2021   Procedure: ENDOSCOPIC RETROGRADE CHOLANGIOPANCREATOGRAPHY (ERCP);  Surgeon: Willis Modena, MD;  Location: [facility];  Service: Endoscopy;  Laterality: N/A;   NO PAST SURGERIES  PANCREATIC STENT PLACEMENT  05/03/2021   Procedure: PANCREATIC STENT PLACEMENT;  Surgeon: Willis Modena, MD;  Location: [facility] ENDOSCOPY;  Service: Endoscopy;;   REMOVAL OF STONES  05/03/2021   Procedure: REMOVAL OF STONES;  Surgeon: Willis Modena,  MD;  Location: [facility] ENDOSCOPY;  Service: Endoscopy;;   SPHINCTEROTOMY  05/03/2021   Procedure: SPHINCTEROTOMY;  Surgeon: Willis Modena, MD;  Location: [facility] ENDOSCOPY;  Service: Endoscopy;;   Family History:  Family History  Problem Relation Age of Onset   Mental illness Mother    ADD / ADHD Neg Hx    Anxiety disorder Neg Hx    Alcohol abuse Neg Hx    Arthritis Neg Hx    Asthma Neg Hx    Cancer Neg Hx    Birth defects Neg Hx    Depression Neg Hx    COPD Neg Hx    Diabetes Neg Hx    Drug abuse Neg Hx    Early death Neg Hx    Hearing loss Neg Hx    Heart disease Neg Hx    Hyperlipidemia Neg Hx    Hypertension Neg Hx    Intellectual disability Neg Hx    Kidney disease Neg Hx    Learning disabilities Neg Hx    Miscarriages / Stillbirths Neg Hx    Stroke Neg Hx    Obesity Neg Hx    Vision loss Neg Hx    Varicose Veins Neg Hx    Family Psychiatric History: see H&P Social History:  Social History   Substance and Sexual Activity  Alcohol Use Not Currently   Alcohol/week: 1.0 standard drink of alcohol   Types: 1 Standard drinks or equivalent per week     Social History   Substance and Sexual Activity  Drug Use Not Currently   Frequency: 14.0 times per week   Types: Marijuana   Comment: pt reports 1-2 blunts on work days, more on days off    Social History   Socioeconomic History   Marital status: Single    Spouse name: Not on file   Number of children: Not on file   Years of education: Not on file   Highest education level: 10th grade  Occupational History   Occupation: Disabled  Tobacco Use   Smoking status: Never   Smokeless tobacco: Never  Vaping Use   Vaping Use: Never used  Substance and Sexual Activity   Alcohol use: Not Currently    Alcohol/week: 1.0 standard drink of alcohol    Types: 1 Standard drinks or equivalent per week   Drug use: Not Currently    Frequency: 14.0 times per week    Types: Marijuana    Comment: pt reports 1-2 blunts on work days,  more on days off   Sexual activity: Yes    Birth control/protection: None  Other Topics Concern   Not on file  Social History Narrative   Pt lives with mother in [employer] [place].  She has an upcoming appointment with [facility] [place] (on June 2).     Social Determinants of Health   Financial Resource Strain: Low Risk  (09/16/2018)   Overall Financial Resource Strain (CARDIA)    Difficulty of Paying Living Expenses: Not very hard  Food Insecurity: No Food Insecurity (12/24/2022)   Hunger Vital Sign    Worried About Running Out of Food in the Last Year: Never true    Ran Out of Food in the Last Year: Never true  Transportation Needs: No Transportation Needs (  12/24/2022)   PRAPARE - Administrator, Civil Service (Medical): No    Lack of Transportation (Non-Medical): No  Physical Activity: Inactive (09/16/2018)   Exercise Vital Sign    Days of Exercise per Week: 0 days    Minutes of Exercise per Session: 0 min  Stress: No Stress Concern Present (09/16/2018)   [employer] of Occupational Health - Occupational Stress Questionnaire    Feeling of Stress : Only a little  Social Connections: Moderately Isolated (09/16/2018)   Social Connection and Isolation Panel [NHANES]    Frequency of Communication with Friends and Family: Never    Frequency of Social Gatherings with Friends and Family: Never    Attends Religious Services: More than 4 times per year    Active Member of [employer] or Organizations: No    Attends Banker Meetings: Never    Marital Status: Never married    Hospital Course:   Brenli Rawdon was admitted for Schizoaffective disorder, bipolar type (HCC) and crisis management.  She was treated with the following medications see MAR.  Cina Stipes was discharged with current medication and was instructed on how to take medications as prescribed; (details listed below under Medication List).  Medical problems were identified and treated as needed.  Home  medications were restarted as appropriate.   Improvement was monitored by observation and Denny Peon Willetts daily report of symptom reduction.  Emotional and mental status was monitored by daily self-inventory reports completed by [name] and clinical staff.         Shahla Beadles was evaluated by the treatment team for stability and plans for continued recovery upon discharge.  Charnesha Trenkamp motivation was an integral factor for scheduling further treatment.  Employment, transportation, bed availability, health status, family support, and any pending legal issues were also considered during her hospital stay.  She was offered further treatment options upon discharge including but not limited to Residential, Intensive Outpatient, and Outpatient treatment.  Ladrina Chellis will follow up with the services as listed below under Follow Up Information.      Upon completion of this admission the Taylia Headen was both mentally and medically stable for discharge denying suicidal/homicidal ideation, auditory/visual/tactile hallucinations, delusional thoughts and paranoia.  Physical Findings: AIMS:  , ,  ,  ,    CIWA:    COWS:     Musculoskeletal: Strength & Muscle Tone: within normal limits Gait & Station: normal Patient leans: N/A   Psychiatric Specialty Exam:  Presentation  General Appearance:  Casual  Eye Contact: Fair  Speech: Clear and Coherent  Speech Volume: Normal  Handedness: Right   Mood and Affect  Mood: Euthymic  Affect: Congruent; Appropriate   Thought Process  Thought Processes: Coherent  Descriptions of Associations:Intact  Orientation:Full (Time, Place and Person)  Thought Content:Logical  History of Schizophrenia/Schizoaffective disorder:Yes  Duration of Psychotic Symptoms:Greater than six months  Hallucinations:Hallucinations: None  Ideas of Reference:None  Suicidal Thoughts:Suicidal Thoughts: No  Homicidal Thoughts:Homicidal Thoughts: No   Sensorium   Memory: Immediate Good; Recent Good  Judgment: Fair  Insight: Fair   Art therapist  Concentration: Fair  Attention Span: Fair  Recall: [employer] of Knowledge: Fair  Language: Fair   Psychomotor Activity  Psychomotor Activity: Psychomotor Activity: Normal   Assets  Assets: Communication Skills; Social Support; Resilience; Physical Health   Sleep  Sleep: Sleep: Good    Physical Exam: Physical Exam Vitals and nursing note reviewed.  Constitutional:      Appearance: She  is normal weight.  HENT:     Head: Normocephalic.     Mouth/Throat:     Mouth: Mucous membranes are moist.     Pharynx: Oropharynx is clear.  Eyes:     Pupils: Pupils are equal, round, and reactive to light.  Cardiovascular:     Rate and Rhythm: Normal rate.  Musculoskeletal:     Cervical back: Normal range of motion.  Neurological:     Mental Status: She is alert and oriented to person, place, and time.  Psychiatric:        Attention and Perception: Attention and perception normal.        Mood and Affect: Mood and affect normal.        Speech: Speech normal.        Behavior: Behavior normal. Behavior is cooperative.        Thought Content: Thought content normal.        Cognition and Memory: Cognition and memory normal.        Judgment: Judgment normal.    Review of Systems  Psychiatric/Behavioral:  Negative for depression, hallucinations, memory loss, substance abuse and suicidal ideas. The patient is not nervous/anxious and does not have insomnia.    Blood pressure 114/61, pulse (!) 128, temperature 98.1 F (36.7 C), temperature source Oral, resp. rate 18, height 5\' 5"  (1.651 m), weight 104.3 kg, last menstrual period 12/19/2022, SpO2 100 %. Body mass index is 38.27 kg/m.   Social History   Tobacco Use  Smoking Status Never  Smokeless Tobacco Never   Tobacco Cessation:  N/A, patient does not currently use tobacco products   Blood Alcohol level:  Lab  Results  Component Value Date   ETH <10 12/24/2022   ETH <10 12/28/2020    Metabolic Disorder Labs:  Lab Results  Component Value Date   HGBA1C 4.4 (L) 12/25/2022   MPG 79.58 12/25/2022   MPG 100 12/28/2020   No results found for: "PROLACTIN" Lab Results  Component Value Date   CHOL 136 12/25/2022   TRIG 72 12/25/2022   HDL 40 (L) 12/25/2022   CHOLHDL 3.4 12/25/2022   VLDL 14 12/25/2022   LDLCALC 82 12/25/2022   LDLCALC 69 12/28/2020    See Psychiatric Specialty Exam and Suicide Risk Assessment completed by Attending Physician prior to discharge.  Discharge destination:  Home  Is patient on multiple antipsychotic therapies at discharge:  No   Has Patient had three or more failed trials of antipsychotic monotherapy by history:  No  Recommended Plan for Multiple Antipsychotic Therapies: NA  Discharge Instructions     Diet - low sodium heart healthy   Complete by: As directed    Increase activity slowly   Complete by: As directed       Allergies as of 12/29/2022       Reactions   Bactrim [sulfamethoxazole-trimethoprim]    Trouble breathing, swelling   Blue Dyes (parenteral) Other (See Comments)   G6PD-- Decreased hemoglobin   Codeine Other (See Comments)   Decreased hemoglobin   Ibuprofen    Trouble breathing, swelling   Other Other (See Comments)   G6PD--Decreased hemoglobin to potato chips (not potatoes in other forms)   Penicillins    Trouble breathing, swelling        Medication List     STOP taking these medications    cyclobenzaprine 5 MG tablet Commonly known as: FLEXERIL       TAKE these medications      Indication  ARIPiprazole  15 MG tablet Commonly known as: ABILIFY Take 1 tablet (15 mg total) by mouth daily. Start taking on: Dec 30, 2022  Indication: Schizophrenia   FLUoxetine 20 MG capsule Commonly known as: PROZAC Take 1 capsule (20 mg total) by mouth daily. Start taking on: Dec 30, 2022  Indication: Depression    hydrOXYzine 25 MG tablet Commonly known as: ATARAX Take 1 tablet (25 mg total) by mouth 3 (three) times daily as needed for anxiety.  Indication: Feeling Anxious   traZODone 100 MG tablet Commonly known as: DESYREL Take 1 tablet (100 mg total) by mouth at bedtime.  Indication: Trouble Sleeping        Follow-up Information     [facility]. Go to.   Specialty: Behavioral Health Why: You have a follow up appointment at this location at 9am on 01/05/2023. Please go to this provider for ongoing therapy and medication management services. Contact information: [address] [place] [address] [phone]                Follow-up recommendations:  Activity: As tolerated  Diet: Heart healthy  Other: -Follow-up with your outpatient psychiatric provider -instructions on appointment date, time, and address (location) are provided to you in discharge paperwork.   -Take your psychiatric medications as prescribed at discharge - instructions are provided to you in the discharge paperwork.    -Follow-up with outpatient primary care doctor and other specialists -for management of preventative medicine and chronic medical disease, including:   -Testing: Follow-up with outpatient provider for abnormal lab results:  none   -Recommend abstinence from alcohol, tobacco, and other illicit drug use at discharge.    -If your psychiatric symptoms recur, worsen, or if you have side effects to your psychiatric medications, call your outpatient psychiatric provider, 911, 988 or go to the nearest emergency department.   -If suicidal thoughts recur, call your outpatient psychiatric provider, 911, 988 or go to the nearest emergency department.  Comments:    Signed: Loletta Parish, NP 12/29/2022, 11:22 AM

## 2023-01-05 ENCOUNTER — Ambulatory Visit (HOSPITAL_COMMUNITY): Payer: Medicaid Other | Admitting: Psychiatry

## 2023-04-17 ENCOUNTER — Encounter (HOSPITAL_COMMUNITY): Payer: Self-pay

## 2023-04-17 ENCOUNTER — Emergency Department (HOSPITAL_COMMUNITY): Payer: MEDICAID

## 2023-04-17 ENCOUNTER — Other Ambulatory Visit: Payer: Self-pay

## 2023-04-17 ENCOUNTER — Emergency Department (HOSPITAL_COMMUNITY)
Admission: EM | Admit: 2023-04-17 | Discharge: 2023-04-17 | Disposition: A | Discharge status: Home / Self Care | Payer: MEDICAID | Attending: Emergency Medicine | Admitting: Emergency Medicine

## 2023-04-17 DIAGNOSIS — R110 Nausea: Secondary | ICD-10-CM

## 2023-04-17 DIAGNOSIS — N852 Hypertrophy of uterus: Secondary | ICD-10-CM | POA: Insufficient documentation

## 2023-04-17 DIAGNOSIS — R112 Nausea with vomiting, unspecified: Secondary | ICD-10-CM | POA: Insufficient documentation

## 2023-04-17 DIAGNOSIS — N1330 Unspecified hydronephrosis: Secondary | ICD-10-CM | POA: Diagnosis not present

## 2023-04-17 LAB — COMPREHENSIVE METABOLIC PANEL
ALT: 17 U/L (ref 0–44)
AST: 14 U/L — ABNORMAL LOW (ref 15–41)
Albumin: 3.7 g/dL (ref 3.5–5.0)
Alkaline Phosphatase: 50 U/L (ref 38–126)
Anion gap: 8 (ref 5–15)
BUN: 11 mg/dL (ref 6–20)
CO2: 24 mmol/L (ref 22–32)
Calcium: 8.7 mg/dL — ABNORMAL LOW (ref 8.9–10.3)
Chloride: 104 mmol/L (ref 98–111)
Creatinine, Ser: 0.81 mg/dL (ref 0.44–1.00)
GFR, Estimated: >60 mL/min (ref >60)
Glucose, Bld: 98 mg/dL (ref 70–99)
Potassium: 3.5 mmol/L (ref 3.5–5.1)
Sodium: 136 mmol/L (ref 135–145)
Total Bilirubin: 0.6 mg/dL (ref 0.3–1.2)
Total Protein: 8.4 g/dL — ABNORMAL HIGH (ref 6.5–8.1)

## 2023-04-17 LAB — URINALYSIS, ROUTINE W REFLEX MICROSCOPIC
Bilirubin Urine: NEGATIVE
Glucose, UA: NEGATIVE mg/dL
Hgb urine dipstick: NEGATIVE
Ketones, ur: NEGATIVE mg/dL
Nitrite: NEGATIVE
Protein, ur: NEGATIVE mg/dL
Specific Gravity, Urine: 1.005 (ref 1.005–1.030)
pH: 6 (ref 5.0–8.0)

## 2023-04-17 LAB — CBC
HCT: 31.2 % — ABNORMAL LOW (ref 36.0–46.0)
Hemoglobin: 8.7 g/dL — ABNORMAL LOW (ref 12.0–15.0)
MCH: 20 pg — ABNORMAL LOW (ref 26.0–34.0)
MCHC: 27.9 g/dL — ABNORMAL LOW (ref 30.0–36.0)
MCV: 71.9 fL — ABNORMAL LOW (ref 80.0–100.0)
Platelets: 490 10*3/uL — ABNORMAL HIGH (ref 150–400)
RBC: 4.34 MIL/uL (ref 3.87–5.11)
RDW: 20.5 % — ABNORMAL HIGH (ref 11.5–15.5)
WBC: 8.3 10*3/uL (ref 4.0–10.5)
nRBC: 0 % (ref 0.0–0.2)

## 2023-04-17 LAB — HCG, SERUM, QUALITATIVE: Preg, Serum: NEGATIVE

## 2023-04-17 LAB — LIPASE, BLOOD: Lipase: 21 U/L (ref 11–51)

## 2023-04-17 MED ORDER — LACTATED RINGERS IV BOLUS
1000.0000 mL | Freq: Once | INTRAVENOUS | Status: AC
  Administered 2023-04-17: 1000 mL via INTRAVENOUS

## 2023-04-17 MED ORDER — ONDANSETRON 4 MG PO TBDP: 4.0000 mg | ORAL_TABLET | Freq: Three times a day (TID) | ORAL | 0 refills | Status: AC | PRN

## 2023-04-17 MED ORDER — IOHEXOL 300 MG/ML  SOLN
100.0000 mL | Freq: Once | INTRAMUSCULAR | Status: AC | PRN
  Administered 2023-04-17: 100 mL via INTRAVENOUS

## 2023-04-17 MED ORDER — ONDANSETRON 4 MG PO TBDP
4.0000 mg | ORAL_TABLET | Freq: Once | ORAL | Status: AC
  Administered 2023-04-17: 4 mg via ORAL
  Filled 2023-04-17: qty 1

## 2023-04-17 NOTE — ED Provider Notes (Signed)
[place] [facility] AT [facility] Provider Note   CSN: [id number] Arrival date & time: 04/17/23  1523     History  Chief Complaint  Patient presents with   Nausea    Sandra Macdonald is a 27 y.o. female who presents with concern for 2 weeks of nausea and 1 episode of vomiting earlier today.  Denies any diarrhea or constipation.  Last bowel movement was 2 days ago.  Reports she is still passing flatus.  Denies any fever or chills, no recent travel, no new foods or medications.  Denies any abdominal pain, dysuria, hematuria, increased frequency.  She does have a history of cholecystectomy.  Also has history of schizoaffective disorder, psychotic disorder  HPI     Home Medications Prior to Admission medications   Medication Sig Start Date End Date Taking? Authorizing Provider  ondansetron (ZOFRAN-ODT) 4 MG disintegrating tablet Take 1 tablet (4 mg total) by mouth every 8 (eight) hours as needed for up to 4 days for nausea or vomiting. 04/17/23 04/21/23 Yes Arabella Merles, PA-C  ARIPiprazole (ABILIFY) 15 MG tablet Take 1 tablet (15 mg total) by mouth daily. 12/30/22   Leevy-Johnson, Lina Sar, NP  FLUoxetine (PROZAC) 20 MG capsule Take 1 capsule (20 mg total) by mouth daily. 12/30/22   Leevy-Johnson, Lina Sar, NP  hydrOXYzine (ATARAX) 25 MG tablet Take 1 tablet (25 mg total) by mouth 3 (three) times daily as needed for anxiety. 12/29/22   Leevy-Johnson, Lina Sar, NP  traZODone (DESYREL) 100 MG tablet Take 1 tablet (100 mg total) by mouth at bedtime. 12/29/22   Leevy-Johnson, Lina Sar, NP      Allergies    Bactrim [sulfamethoxazole-trimethoprim], Blue dyes (parenteral), Codeine, Ibuprofen, Other, and Penicillins    Review of Systems   Review of Systems  Gastrointestinal:  Positive for nausea.    Physical Exam Updated Vital Signs BP 118/73   Pulse 96   Temp 98.9 F (37.2 C) (Oral)   Resp 17   Ht 5\' 5"  (1.651 m)   Wt 81.6 kg   SpO2 100%   BMI 29.95 kg/m   Physical Exam Vitals and nursing note reviewed.  Constitutional:      General: She is not in acute distress.    Appearance: She is well-developed.  HENT:     Head: Normocephalic and atraumatic.  Eyes:     Conjunctiva/sclera: Conjunctivae normal.  Cardiovascular:     Rate and Rhythm: Normal rate and regular rhythm.     Heart sounds: No murmur heard. Pulmonary:     Effort: Pulmonary effort is normal. No respiratory distress.     Breath sounds: Normal breath sounds.  Abdominal:     Palpations: Abdomen is soft.     Tenderness: There is no abdominal tenderness. There is no right CVA tenderness or left CVA tenderness.     Comments: Nontender to the abdomen diffusely Bowel sounds hypoactive  Musculoskeletal:        General: No swelling.     Cervical back: Neck supple.  Skin:    General: Skin is warm and dry.     Capillary Refill: Capillary refill takes less than 2 seconds.  Neurological:     Mental Status: She is alert.  Psychiatric:        Mood and Affect: Mood normal.     ED Results / Procedures / Treatments   Labs (all labs ordered are listed, but only abnormal results are displayed) Labs Reviewed  COMPREHENSIVE METABOLIC PANEL - Abnormal; Notable  for the following components:      Result Value   Calcium 8.7 (*)    Total Protein 8.4 (*)    AST 14 (*)    All other components within normal limits  CBC - Abnormal; Notable for the following components:   Hemoglobin 8.7 (*)    HCT 31.2 (*)    MCV 71.9 (*)    MCH 20.0 (*)    MCHC 27.9 (*)    RDW 20.5 (*)    Platelets 490 (*)    All other components within normal limits  URINALYSIS, ROUTINE W REFLEX MICROSCOPIC - Abnormal; Notable for the following components:   Color, Urine STRAW (*)    APPearance HAZY (*)    Leukocytes,Ua MODERATE (*)    Bacteria, UA FEW (*)    All other components within normal limits  LIPASE, BLOOD  HCG, SERUM, QUALITATIVE    EKG None  Radiology CT ABDOMEN PELVIS W CONTRAST  Result Date:  04/17/2023 CLINICAL DATA:  Nausea for 2 weeks.  No bowel movement for 2 days. EXAM: CT ABDOMEN AND PELVIS WITH CONTRAST TECHNIQUE: Multidetector CT imaging of the abdomen and pelvis was performed using the standard protocol following bolus administration of intravenous contrast. RADIATION DOSE REDUCTION: This exam was performed according to the departmental dose-optimization program which includes automated exposure control, adjustment of the mA and/or kV according to patient size and/or use of iterative reconstruction technique. CONTRAST:  OMNIPAQUE IOHEXOL 300 MG/ML  SOLN COMPARISON:  05/01/2021 abdominal MRI FINDINGS: Lower chest:  No contributory findings. Hepatobiliary: No focal liver abnormality.Cholecystectomy. No biliary dilatation Pancreas: Unremarkable. Spleen: Unremarkable. Adrenals/Urinary Tract: Negative adrenals. Left hydronephrosis and upper hydroureter to the level of the iliac crossing. No calculus or ureteral mass is seen. Unremarkable bladder. Stomach/Bowel: No obstruction. No appendicitis. No abnormal stool retention. Vascular/Lymphatic: No acute vascular abnormality. No mass or adenopathy. Reproductive: Very enlarged and heterogeneous uterus extending to the umbilicus and containing innumerable masses. The uterus rises out of the pelvis but was not seen on comparison MRI from 2 years ago. Other: No ascites or pneumoperitoneum. Musculoskeletal: No acute abnormalities. IMPRESSION: Left hydroureteronephrosis to the level of the iliac crossing where there is mass effect from a very enlarged and heterogeneous uterus. The uterine appearance suggests numerous fibroids but there has been significant growth since an abdominal MRI in 2022 and the associated hydronephrosis is atypical. Recommend gynecology consultation. Electronically Signed   By: Tiburcio Pea M.D.   On: 04/17/2023 19:36    Procedures Procedures    Medications Ordered in ED Medications  ondansetron (ZOFRAN-ODT)  disintegrating tablet 4 mg (4 mg Oral Given 04/17/23 1813)  lactated ringers bolus 1,000 mL (0 mLs Intravenous Stopped 04/17/23 2112)  iohexol (OMNIPAQUE) 300 MG/ML solution 100 mL (100 mLs Intravenous Contrast Given 04/17/23 1844)    ED Course/ Medical Decision Making/ A&P                                 Medical Decision Making Amount and/or Complexity of Data Reviewed Labs: ordered. Radiology: ordered.  Risk Prescription drug management.   28 y.o. female with pertinent past medical history of uterine fibroids presents to the ED for concern of nausea for 2 weeks, 1 episode of vomiting today  Differential diagnosis includes but is not limited to pregnancy, gastroenteritis, small bowel obstruction, pancreatitis  ED Course:  Patient presents with concern for nausea that has been ongoing for the past 2 weeks.  She notes 1 episode of vomiting today, but none otherwise.  She denies any fever or chills at home, no abdominal pain, no diarrhea, last bowel movement 2 days ago.  Still passing flatus, little concern for SBO at this time.  She has been able to eat and drink at home.  On exam, she is very well-appearing.  Does not appear nauseous, no active vomiting.  Vital signs completely within normal limits.  No tenderness to palpation of the abdomen diffusely.  On initial lab workup, serum pregnancy was negative, CMP unremarkable, CBC without leukocytosis.  Low concern for gastroenteritis given no recent travel or new foods, no diarrhea or continued vomiting.  Patient had a low hemoglobin at 8.7, but this seems consistent with her baseline.  Her urine showed leukocytes, but patient denies any dysuria, hematuria, increased frequency.  No indication for treatment of UTI at this time.  Given unrevealing workup, CT abdomen pelvis was obtained to further evaluate.  This showed a enlarged uterus possibly concerning for fibroids causing left-sided hydronephrosis. No nephrolithiasis.  No elevation in creatinine  on CMP.  Gynecology was consulted, they recommended she follow up outpatient with them regarding this finding. Patient was given Zofran for nausea.  LR bolus also given due to reports of vomiting today. Upon reevaluation, reports her nausea has improved.  Plan to discharge home with Zofran for nausea and gynecology follow-up.   Impression: Nausea Enlarged uterus on CT scan and left-sided hydronephrosis.  Disposition:  The patient was discharged home with instructions to use Zofran as needed for nausea.  Follow-up with gynecology within the next week regarding CT finding Return precautions given.  Lab Tests: I Ordered, and personally interpreted labs.  The pertinent results include:   CBC without any leukocytosis CMP unremarkable Urinalysis with leukocytes, no nitrites, no red blood cells  Imaging Studies ordered: I ordered imaging studies including CT abdomen pelvis I independently visualized the imaging with scope of interpretation limited to determining acute life threatening conditions related to emergency care. Imaging showed enlarged and heterogeneous uterus concerning for fibroids, left hydronephrosis due to mass effect from the uterus I agree with the radiologist interpretation   Cardiac Monitoring: / EKG: The patient was maintained on a cardiac monitor.  I personally viewed and interpreted the cardiac monitored which showed an underlying rhythm of: Normal sinus rhythm   Consultations Obtained: I requested consultation with the gynecology Dr. Vergie Living,  and discussed lab and imaging findings as well as pertinent plan - they recommend: Following up outpatient for the finding of the enlarged uterus on CT scan.  He does not feel patient needs any urgent/emergent evaluation at this time.  Dr. Vergie Living will reach out to the office and have them contact the patient for an appointment.            Final Clinical Impression(s) / ED Diagnoses Final diagnoses:  Nausea  Enlarged  uterus  Hydronephrosis of left kidney    Rx / DC Orders ED Discharge Orders          Ordered    ondansetron (ZOFRAN-ODT) 4 MG disintegrating tablet  Every 8 hours PRN        04/17/23 2048              Arabella Merles, PA-C 04/17/23 2130    Anders Simmonds T, DO 04/17/23 2217

## 2023-04-17 NOTE — Discharge Instructions (Addendum)
Your labs today are reassuring.  Your CT scan results are as below: "Left hydroureteronephrosis to the level of the iliac crossing where there is mass effect from a very enlarged and heterogeneous uterus. The uterine appearance suggests numerous fibroids but there has been significant growth since an abdominal MRI in 2022 and the associated hydronephrosis is atypical. Recommend gynecology consultation."  You have been prescribed Zofran to take as needed for your nausea and vomiting.  You may take 1 tablet every 8 hours as needed for nausea and vomiting.  Follow-up with the gynecologist listed below within the next week for follow-up.  Their office should be contacting you to set up an appointment.  Drink plenty of fluids at home and eat a fiber fillet diet (lots of fruits and vegetables). Please engage in daily exercise as this also helps to maintain regular bowel movements.   Try to use the bathroom after eating a meal. Place your feet up on a small stepstool when trying to have a bowel movement.  You may take 1 scoop of Miralax in the morning to maintain normal bowel movements if needed.  This is a medication you can obtain over-the-counter at any drugstore.  Return to the ER if you have uncontrolled vomiting, you no longer are passing gas, you develop severe abdominal pain, any other new or concerning symptoms.

## 2023-04-17 NOTE — ED Triage Notes (Signed)
Patient has been nauseous for 2 weeks, vomiting began today. Feels light headed. Last period August 19. Denies abdominal pain.

## 2023-05-24 ENCOUNTER — Ambulatory Visit (HOSPITAL_COMMUNITY)
Admission: EM | Admit: 2023-05-24 | Discharge: 2023-05-24 | Disposition: A | Discharge status: Home / Self Care | Payer: MEDICAID | Attending: Internal Medicine | Admitting: Internal Medicine

## 2023-05-24 ENCOUNTER — Encounter (HOSPITAL_COMMUNITY): Payer: Self-pay | Admitting: *Deleted

## 2023-05-24 DIAGNOSIS — S39012A Strain of muscle, fascia and tendon of lower back, initial encounter: Secondary | ICD-10-CM

## 2023-05-24 MED ORDER — BACLOFEN 10 MG PO TABS: 10.0000 mg | ORAL_TABLET | Freq: Three times a day (TID) | ORAL | 0 refills | Status: AC

## 2023-05-24 NOTE — ED Triage Notes (Signed)
Pt states on Sunday she picked up her boyfriend and since her lower back has been hurting. She hasn't taken any meds.

## 2023-05-24 NOTE — ED Provider Notes (Signed)
[facility]    CSN: [id number] Arrival date & time: 05/24/23  1002      History   Chief Complaint Chief Complaint  Patient presents with   Back Pain    HPI Sandra Macdonald is a 27 y.o. female.   Sandra Macdonald is a 27 y.o. female presenting for chief complaint of Back Pain.  She states she picked up her boyfriend by grabbing him around the waist and leaning backwards with her body 5 days ago.  She did not experience pain to the low back immediately and was able to go to sleep the night of the injury without pain.  When she woke up the next morning, her low back was bothering her.  Pain is localized to the low back and does not radiate to the legs or the abdomen.  Pain is currently a 5 on a scale of 0-10 when she is sitting and resting, pain increases to 9 out of 10 with movement and twisting.  Low back pain improved significantly with heat. No fall, trauma, numbness or tingling, saddle paresthesia, changes to bowel or urinary habits, extremity weakness, radicular symptoms.  She has not attempted use of any over-the-counter medications help with symptoms PTA.   Back Pain   Past Medical History:  Diagnosis Date   Depression    Fibroid    G6PD deficiency    Supervision of normal first pregnancy, antepartum 02/18/2018    Nursing Staff Provider Office Location  [facility] Dating  LMP/6 week [place]  Language   English  Anatomy US   Flu Vaccine  n/a Genetic Screen  NIPS: low risk, female  AFP:   TDaP vaccine    Hgb A1C or  GTT Early  Third trimester  Rhogam     LAB RESULTS  Feeding Plan Breast Blood Type A/Positive/-- (07/15 0942)  Contraception  Antibody Negative (07/15 0942) Circumcision  Rubella 3.00 (07/15 0942) Pediatricia    Patient Active Problem List   Diagnosis Date Noted   Schizoaffective disorder, bipolar type (HCC) 12/25/2022   GAD (generalized anxiety disorder) 12/25/2022   History of laparoscopic cholecystectomy 05/05/2021   History of ERCP 05/05/2021   History of cholelithiasis  05/05/2021   History of command hallucinations    Iron deficiency anemia 12/28/2020   Psychotic disorder (HCC) 12/10/2019   Psychosis (HCC) 12/03/2019   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   Incompetent cervix in pregnancy, antepartum, 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   Vaginal bleeding before [redacted] weeks gestation 03/20/2018   SAB (spontaneous abortion) 03/20/2018   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   G6PD deficiency anemia (HCC) 01/14/2018   Fibroids 12/30/2017    Past Surgical History:  Procedure Laterality Date   CHOLECYSTECTOMY N/A 05/04/2021   Procedure: LAPAROSCOPIC CHOLECYSTECTOMY;  Surgeon: Darnell Level, MD;  Location: [facility];  Service: General;  Laterality: N/A;   ERCP N/A 05/03/2021   Procedure: ENDOSCOPIC RETROGRADE CHOLANGIOPANCREATOGRAPHY (ERCP);  Surgeon: Willis Modena, MD;  Location: [facility];  Service: Endoscopy;  Laterality: N/A;   NO PAST SURGERIES     PANCREATIC STENT PLACEMENT  05/03/2021   Procedure: PANCREATIC STENT PLACEMENT;  Surgeon: Willis Modena, MD;  Location: [facility];  Service: Endoscopy;;   REMOVAL OF STONES  05/03/2021   Procedure: REMOVAL OF STONES;  Surgeon: Willis Modena, MD;  Location: [facility];  Service: Endoscopy;;   SPHINCTEROTOMY  05/03/2021   Procedure: Dennison Mascot;  Surgeon: Willis Modena, MD;  Location: [facility];  Service: Endoscopy;;    OB History     [place]  2   Para      Term      Preterm      AB  2   Living         SAB  2   IAB      Ectopic      Multiple      Live Births               Home Medications    Prior to Admission medications   Medication Sig Start Date End Date Taking? Authorizing Provider  baclofen (LIORESAL) 10 MG tablet Take 1 tablet (10 mg total) by mouth 3 (three) times daily. 05/24/23  Yes Carlisle Beers, FNP  ARIPiprazole (ABILIFY) 15 MG tablet Take 1 tablet (15 mg total) by mouth daily. 12/30/22   Leevy-Johnson, Lina Sar, NP  FLUoxetine  (PROZAC) 20 MG capsule Take 1 capsule (20 mg total) by mouth daily. 12/30/22   Leevy-Johnson, Lina Sar, NP  hydrOXYzine (ATARAX) 25 MG tablet Take 1 tablet (25 mg total) by mouth 3 (three) times daily as needed for anxiety. 12/29/22   Leevy-Johnson, Lina Sar, NP  traZODone (DESYREL) 100 MG tablet Take 1 tablet (100 mg total) by mouth at bedtime. 12/29/22   Leevy-Johnson, Lina Sar, NP    Family History Family History  Problem Relation Age of Onset   Mental illness Mother    ADD / ADHD Neg Hx    Anxiety disorder Neg Hx    Alcohol abuse Neg Hx    Arthritis Neg Hx    Asthma Neg Hx    Cancer Neg Hx    Birth defects Neg Hx    Depression Neg Hx    COPD Neg Hx    Diabetes Neg Hx    Drug abuse Neg Hx    Early death Neg Hx    Hearing loss Neg Hx    Heart disease Neg Hx    Hyperlipidemia Neg Hx    Hypertension Neg Hx    Intellectual disability Neg Hx    Kidney disease Neg Hx    Learning disabilities Neg Hx    Miscarriages / Stillbirths Neg Hx    Stroke Neg Hx    Obesity Neg Hx    Vision loss Neg Hx    Varicose Veins Neg Hx     Social History Social History   Tobacco Use   Smoking status: Never   Smokeless tobacco: Never  Vaping Use   Vaping status: Never Used  Substance Use Topics   Alcohol use: Not Currently    Alcohol/week: 1.0 standard drink of alcohol    Types: 1 Standard drinks or equivalent per week   Drug use: Yes    Frequency: 14.0 times per week    Types: Marijuana    Comment: pt reports 1-2 blunts on work days, more on days off     Allergies   Bactrim [sulfamethoxazole-trimethoprim], Blue dyes (parenteral), Codeine, Ibuprofen, Other, and Penicillins   Review of Systems Review of Systems  Musculoskeletal:  Positive for back pain.  Per HPI   Physical Exam Triage Vital Signs ED Triage Vitals  Encounter Vitals Group     BP 05/24/23 1115 128/81     Systolic BP Percentile --      Diastolic BP Percentile --      Pulse Rate 05/24/23 1115 (!) 111     Resp  05/24/23 1115 18     Temp 05/24/23 1115 98.1 F (36.7 C)     Temp Source 05/24/23  1115 Oral     SpO2 05/24/23 1115 98 %     Weight --      Height --      Head Circumference --      Peak Flow --      Pain Score 05/24/23 1113 5     Pain Loc --      Pain Education --      Exclude from Growth Chart --    No data found.  Updated Vital Signs BP 128/81 (BP Location: Left Arm)   Pulse (!) 111   Temp 98.1 F (36.7 C) (Oral)   Resp 18   LMP 05/16/2023 (Exact Date)   SpO2 98%   Visual Acuity Right Eye Distance:   Left Eye Distance:   Bilateral Distance:    Right Eye Near:   Left Eye Near:    Bilateral Near:     Physical Exam Vitals and nursing note reviewed.  Constitutional:      Appearance: She is not ill-appearing or toxic-appearing.  HENT:     Head: Normocephalic and atraumatic.     Right Ear: Hearing and external ear normal.     Left Ear: Hearing and external ear normal.     Nose: Nose normal.     Mouth/Throat:     Lips: Pink.  Eyes:     General: Lids are normal. Vision grossly intact. Gaze aligned appropriately.     Extraocular Movements: Extraocular movements intact.     Conjunctiva/sclera: Conjunctivae normal.  Pulmonary:     Effort: Pulmonary effort is normal.  Musculoskeletal:     Cervical back: Normal and neck supple.     Thoracic back: Normal.     Lumbar back: Tenderness (Tenderness to palpation of the bilateral lumbar paraspinous muscles as well as the midline lumbar spine.) present. No swelling, edema, deformity, signs of trauma, lacerations, spasms or bony tenderness. Normal range of motion. No scoliosis.     Comments: Strength and sensation intact to bilateral upper and lower extremities (5/5). Moves all 4 extremities with normal coordination voluntarily. Non-focal neuro exam.   Skin:    General: Skin is warm and dry.     Capillary Refill: Capillary refill takes less than 2 seconds.     Findings: No rash.  Neurological:     General: No focal deficit  present.     Mental Status: She is alert and oriented to person, place, and time. Mental status is at baseline.     Cranial Nerves: No dysarthria or facial asymmetry.  Psychiatric:        Mood and Affect: Mood normal.        Speech: Speech normal.        Behavior: Behavior normal.        Thought Content: Thought content normal.        Judgment: Judgment normal.      UC Treatments / Results  Labs (all labs ordered are listed, but only abnormal results are displayed) Labs Reviewed - No data to display  EKG   Radiology No results found.  Procedures Procedures (including critical care time)  Medications Ordered in UC Medications - No data to display  Initial Impression / Assessment and Plan / UC Course  I have reviewed the triage vital signs and the nursing notes.  Pertinent labs & imaging results that were available during my care of the patient were reviewed by me and considered in my medical decision making (see chart for details).   1.  Strain  of lumbar region Evaluation suggests pain is musculoskeletal in nature. Will manage this with rest, gentle ROM exercises, heat therapy, ibuprofen as needed for pain, and as needed use of muscle relaxer. Drowsiness precautions discussed regarding muscle relaxer use. Imaging: no indication for imaging based on stable musculoskeletal exam findings May follow-up with orthopedics as needed. Work/school excise note given.  Counseled patient on potential for adverse effects with medications prescribed/recommended today, strict ER and return-to-clinic precautions discussed, patient verbalized understanding.    Final Clinical Impressions(s) / UC Diagnoses   Final diagnoses:  Strain of lumbar region, initial encounter     Discharge Instructions      Your pain is likely due to a muscle strain which will improve on its own with time.   - You may take over the counter medicines to help with pain (ibuprofen 600mg  every 6 hours as  needed for pain). - You may also take the prescribed muscle relaxer as directed as needed for muscle aches/spasm.  Do not take this medication and drive or drink alcohol as it can make you sleepy.  Mainly use this medicine at nighttime as needed. - Apply heat 20 minutes on then 20 minutes off and perform gentle range of motion exercises to the area of greatest pain to prevent muscle stiffness and provide further pain relief.   Red flag symptoms to watch out for are numbness/tingling to the legs, weakness, loss of bowel/bladder control, and/or worsening pain that does not respond well to medicines. Follow-up with your primary care provider or return to urgent care if your symptoms do not improve in the next 3 to 4 days with medications and interventions recommended today. If your symptoms are severe (red flag), please go to the emergency room.       ED Prescriptions     Medication Sig Dispense Auth. Provider   baclofen (LIORESAL) 10 MG tablet Take 1 tablet (10 mg total) by mouth 3 (three) times daily. 30 each Carlisle Beers, FNP      PDMP not reviewed this encounter.   Carlisle Beers, [place] 05/24/23 1243

## 2023-05-24 NOTE — Discharge Instructions (Signed)
Your pain is likely due to a muscle strain which will improve on its own with time.   - You may take over the counter medicines to help with pain (ibuprofen 600mg  every 6 hours as needed for pain). - You may also take the prescribed muscle relaxer as directed as needed for muscle aches/spasm.  Do not take this medication and drive or drink alcohol as it can make you sleepy.  Mainly use this medicine at nighttime as needed. - Apply heat 20 minutes on then 20 minutes off and perform gentle range of motion exercises to the area of greatest pain to prevent muscle stiffness and provide further pain relief.   Red flag symptoms to watch out for are numbness/tingling to the legs, weakness, loss of bowel/bladder control, and/or worsening pain that does not respond well to medicines. Follow-up with your primary care provider or return to urgent care if your symptoms do not improve in the next 3 to 4 days with medications and interventions recommended today. If your symptoms are severe (red flag), please go to the emergency room.
